# Patient Record
Sex: Male | Born: 1940 | Race: White | Hispanic: No | Smoking: Never smoker
Health system: Southern US, Community
[De-identification: ages and names within clinical notes are randomized; demographics above are authoritative.]

## PROBLEM LIST (undated history)

## (undated) DIAGNOSIS — M109 Gout, unspecified: Secondary | ICD-10-CM

## (undated) DIAGNOSIS — I2510 Atherosclerotic heart disease of native coronary artery without angina pectoris: Secondary | ICD-10-CM

## (undated) DIAGNOSIS — E7800 Pure hypercholesterolemia, unspecified: Secondary | ICD-10-CM

## (undated) DIAGNOSIS — C9110 Chronic lymphocytic leukemia of B-cell type not having achieved remission: Secondary | ICD-10-CM

## (undated) DIAGNOSIS — I10 Essential (primary) hypertension: Secondary | ICD-10-CM

## (undated) DIAGNOSIS — N429 Disorder of prostate, unspecified: Secondary | ICD-10-CM

## (undated) DIAGNOSIS — K5792 Diverticulitis of intestine, part unspecified, without perforation or abscess without bleeding: Secondary | ICD-10-CM

## (undated) HISTORY — PX: APPENDECTOMY: SHX54

## (undated) HISTORY — PX: CARDIAC CATHETERIZATION: SHX172

---

## 2003-04-19 ENCOUNTER — Ambulatory Visit (HOSPITAL_COMMUNITY): Admission: RE | Admit: 2003-04-19 | Discharge: 2003-04-19 | Payer: Self-pay | Admitting: Gastroenterology

## 2003-04-19 ENCOUNTER — Encounter (INDEPENDENT_AMBULATORY_CARE_PROVIDER_SITE_OTHER): Payer: Self-pay | Admitting: *Deleted

## 2005-01-05 ENCOUNTER — Inpatient Hospital Stay (HOSPITAL_COMMUNITY): Admission: EM | Admit: 2005-01-05 | Discharge: 2005-01-08 | Payer: Self-pay | Admitting: Internal Medicine

## 2006-12-16 ENCOUNTER — Encounter: Admission: RE | Admit: 2006-12-16 | Discharge: 2006-12-16 | Payer: Self-pay

## 2008-05-28 ENCOUNTER — Encounter: Admission: RE | Admit: 2008-05-28 | Discharge: 2008-05-28 | Payer: Self-pay | Admitting: Gastroenterology

## 2009-02-26 ENCOUNTER — Emergency Department (HOSPITAL_BASED_OUTPATIENT_CLINIC_OR_DEPARTMENT_OTHER): Admission: EM | Admit: 2009-02-26 | Discharge: 2009-02-26 | Payer: Self-pay | Admitting: Emergency Medicine

## 2011-02-09 NOTE — H&P (Signed)
NAMEEDUAR, KUMPF               ACCOUNT NO.:  [id number]   MEDICAL RECORD NO.:  [id number]          PATIENT TYPE:  INP   LOCATION:  0442                         FACILITY:  [facility]   PHYSICIAN:  Elliot Cousin, M.D.    DATE OF BIRTH:  05/07/1941   DATE OF ADMISSION:  01/05/2005  DATE OF DISCHARGE:                                HISTORY & PHYSICAL   PRIMARY CARE PHYSICIAN:  Dr. Marjory Lies.   CHIEF COMPLAINT:  Lower abdominal pain and cramping, nausea, subjective  fever and chills.   HISTORY OF PRESENT ILLNESS:  Mr. Perrelli is a 70 year old man with a past  medical history significant for diverticulitis in December 2005, colon  polyps, and hypertension, who presented to his physician's office today with  a chief complaint of lower abdominal pain described as crampy. [name]  symptoms started 3 days ago. The crampy pain is worse on the left than the  right; 7-8/10 in intensity.  He has not had diarrhea or constipation  although he states that he has a mucous discharge from his rectum. He has  had an increase in flatulence. His last bowel movement was considered  normal 2 days ago. The patient has had some intermittent nausea but no  vomiting. His appetite has been poor. He has had subjective fever and  chills. He denies melena and bright red blood per rectum. He denies any  painful urination. He has not eaten nuts or seeds as recommended by his  physician in the past. The night before his symptoms started, he did eat out  at a [employer]. The patient states that his symptoms are  reminiscent of diverticulitis diagnosed December 2005. The patient had a  prescription as needed for Cipro and Flagyl which was given to him in  January by Dr. Doristine Counter. The patient did fill the prescription 2 days ago and  has taken Flagyl and Cipro over the past 2 days. He has not obtained relief.  The patient diagnosis NSAID use chronically. He drinks alcohol only  occasionally.   PAST  MEDICAL HISTORY:  1.  Acute diverticulitis, December 2005. CT scan of the abdomen and pelvis      at [facility] confirmed acute diverticulitis of the upper sigmoid      colon.  2.  Colonic polyps and diverticulosis per colonoscopy in July 2004 by Dr.      Loreta Ave. The pathology report revealed adenomatous and hyperplastic polyps.  3.  Hypertension.  4.  Hyperlipidemia.  5.  BPH.  6.  History of elevated LFTs.  7.  Gout.  8.  Seasonal allergies.  9.  Status post appendectomy in the past.   MEDICATIONS:  1.  Cipro 500 mg b.i.d.  2.  Flagyl 500 mg b.i.d.  3.  Flomax 0.4 mg daily.  4.  Zetia 10 mg daily.  5.  Avapro 150 mg daily.  6.  Norvasc 5 mg daily.  7.  Allopurinol 300 mg daily.  8.  Allegra 180 mg daily.   ALLERGIES:  The patient has no known drug allergies although he does have an  intolerance  to CODEINE and ACE INHIBITORS. The ACE inhibitors cause a cough.   SOCIAL HISTORY:  The patient is divorced. [place] He has [place] child. He is employed as a Merchandiser, retail at [employer]. He  denies tobacco and illicit drug use. He does drink alcohol occasionally.   FAMILY HISTORY:  His father died of coronary artery disease at 71 years of  age. The health and status of his mother are unknown.   REVIEW OF SYSTEMS:  [name] review of systems is otherwise negative.   PHYSICAL EXAMINATION:  VITAL SIGNS: Temperature 96.8, pulse is 55,  respiratory rate 20, blood pressure 117/80, oxygen saturation 95% on room  air.  GENERAL:  The patient is a pleasant 70 year old Caucasian man who is  currently lying in bed in only mild abdominal distress.  HEENT:  Head is normocephalic, nontraumatic. Pupils are equally round,  reactive to light. Extraocular movements are intact. Conjunctivae are clear,  sclerae are white. Tympanic membranes are clear bilaterally. Nasal mucosa is  moist, no drainage. No sinus tenderness. Oropharynx reveals good dentition.  No posterior  exudates or erythema. Mucous membranes are moist.  NECK:  Supple. No adenopathy, no thyromegaly, no bruit, no JVD.  LUNGS:  Clear to auscultation bilaterally.  HEART:  S1, S2, no murmurs, rubs or gallops, borderline bradycardia.  ABDOMEN:  Mildly obese, positive bowel sounds although they were hypoactive.  Abdomen is mildly distended. Tender moderately in the left lower quadrant  greater than the right lower quadrant. No rebound, no guarding. No masses  palpated. No appreciable hepatosplenomegaly.  RECTAL:  The patient has excellent rectal tone. His prostate is smooth and  mildly enlarged. There was no stool in the rectal vault and no blood on the  examining glove. No masses palpated.  GENITOURINARY:  Deferred.  EXTREMITIES:  The patient has excellent range of motion of all of his  extremities. Pedal pulses are 2+ bilaterally. No pretibial edema, no pedal  edema. No acute joint abnormalities.  NEUROLOGIC:  The patient is alert and oriented x3. Cranial nerves II-XII are  intact. Strength is 5/5 throughout. Sensation is intact. Gait is within  normal limits.   ADMISSION LABORATORY:  Pending.   ASSESSMENT:  1.  Crampy lower abdominal pain, nausea, subjective fever and chills. 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 He is currently afebrile      and does not appear toxic. Further evaluation is needed.  2.  Hypertension. The patient states that his blood pressures have been well      controlled on Avapro and Norvasc.   PLAN:  1.  The patient was directly admitted from Dr. Duanne Guess.  2.  The patient will be further evaluated with a CT scan of the abdomen and      pelvis, CBC, CMET, amylase, and lipase.  3.  Gentle IV fluids of D5 normal saline with potassium chloride added.  4.  Will try sips and chips and then progress to clear liquid diet if      tolerated.  5.  Cipro and Flagyl IV. 6.  Will minimize p.o. medications for now but will start Flomax and Avapro      only for  right now. Will add p.o. medications as the patient can      tolerate it.  7.  Morphine as needed for pain.      DF/MEDQ  D:  01/05/2005  T:  01/05/2005  Job:  [id number]   cc:  Marjory Lies, M.D.  [address]  [place]  [place] [address]  Fax: [phone]   Maryelizabeth Rowan, M.D.  [facility]  [place], [place] [address]  Fax: [phone]

## 2011-02-09 NOTE — Discharge Summary (Signed)
Corey [name], Corey [name]               ACCOUNT NO.:  [id number]   MEDICAL RECORD NO.:  [id number]          PATIENT TYPE:  INP   LOCATION:  0442                         [facility]ACILITY:  [facility]   PHYSICIAN:  Isidor Holts, M.D.  DATE OF BIRTH:  1941-04-10   DATE OF ADMISSION:  01/05/2005  DATE OF DISCHARGE:  01/08/2005                                 DISCHARGE SUMMARY   PRIMARY CARE PHYSICIAN:  Marjory Lies, M.D.   DISCHARGE DIAGNOSES:  1.  Acute sigmoid diverticulitis.  2.  History of hypertension.  3.  History of gout and dyslipidemia.  4.  Benign prostatic hyperplasia.  5.  History of seasonal allergies.   DISCHARGE MEDICATIONS:  1.  Ciprofloxacin 500 mg p.o. b.i.d. to be completed on January 15, 2005.  2.  Flagyl 500 mg p.o. t.i.d. to be completed on January 15, 2005.  3.  Continue all other preadmission medications.   PROCEDURES:  1.  Abdominal/pelvic CT scan dated January 05, 2005. This showed sigmoid      diverticulitis without evidence of pelvic abscess or perforation.  There      were no acute abdominal findings of hepatic or renal cyst.  2.  Abdominal x-ray dated January 07, 2005.  This showed no bowel obstruction      or dilatation.  No acute findings.   CONSULTATIONS:  None.   ADMISSION HISTORY:  As in H&P note of January 05, 2005.  However in brief,  this is a 70 year old male with known history of diverticulosis, status post  acute diverticulitis December 2005, colon polyps, hypertension,  dyslipidemia, and BPH, as well as gout and seasonal allergic rhinitis, who  presented to his primary care physician with a three-day history of crampy  lower abdominal pain.  During his previous acute diverticulitis, he had been  given a prescription for ciprofloxacin and Flagyl by his primary care  physician, Dr. Marjory Lies.  The patient filled this two days prior to  presentation and took the tablets accordingly, without any appreciable  relief.  He was admitted for further evaluation,  investigation, and  management.   CLINICAL COURSE:  #1.  ACUTE SIGMOID DIVERTICULITIS:  The patient presented with three-day  history of crampy lower abdominal pain.  He gives background history of  documented diverticulosis, status post acute diverticulitis in December  2005.  Pelvic/abdominal CT scan was done which confirmed acute sigmoid  diverticulitis.  The patient was managed with bowel rest, intravenous fluid  hydration, intravenous Flagyl and ciprofloxacin as well as analgesic.  Clinical response was steady with gradual amelioration of symptomatology.  The patient was subsequently placed on both mechanical and regular diet, all  of which patient tolerated in sequence without any exacerbation of symptoms.   #2.  BACKGROUND CLINICAL PROBLEMS INCLUDING HYPERTENSION, PROSTATISM,  DYSLIPIDEMIA, GOUT:  All of which remained stable throughout the course of  patient's hospital stay.   DISPOSITION:  The patient was discharged in satisfactory condition on January 08, 2005.  It is noted that he has had a background history of abnormal LFTs  secondary to statin use.  We note, that LFTs  done during his current  admission were all within normal limits.  The patient was reassured  accordingly.   PAIN MANAGEMENT:  Not applicable.   ACTIVITY:  No restrictions.   DIET:  High-fiber diet with four to five 8-ounce glasses of fluid per 24  hours.   WOUND CARE:  Not applicable.   FOLLOW UP:  The patient is instructed to follow up with his primary care  physician within 7 to 10 days.  He is to call for an appointment, and he has  verbalized understanding.      CO/MEDQ  D:  01/09/2005  T:  01/09/2005  Job:  [id number]   cc:   Marjory Lies, M.D.  [address]  [place]  [place] [address]  Fax: [phone]

## 2011-02-09 NOTE — Op Note (Signed)
NAME:  Corey [name], Corey [name]                         ACCOUNT NO.:  [id number]   MEDICAL RECORD NO.:  [id number]                   PATIENT TYPE:  AMB   LOCATION:  ENDO                                 FACILITY:  [facility]   PHYSICIAN:  Anselmo Rod, M.D.               DATE OF BIRTH:  01/02/1941   DATE OF PROCEDURE:  04/19/2003  DATE OF DISCHARGE:                                 OPERATIVE REPORT   PROCEDURE PERFORMED:  Colonoscopy with snare polypectomy times two and cold  biopsies times eight.   ENDOSCOPIST:  Charna Elizabeth, M.D.   INSTRUMENT USED:  Olympus video colonoscope.   INDICATIONS FOR PROCEDURE:  70 year old white male with a history of change  in bowel habits and a personal history of polyps removed several years ago.  Rule out colonic polyps, etc.   PREPROCEDURE PREPARATION:  Informed consent was procured from the patient.  The patient was fasted for eight hours prior to the procedure and prepped  with a bottle of magnesium citrate and a gallon of GoLYTELY the night prior  to the procedure.   PREPROCEDURE PHYSICAL:  The patient had stable vital signs.  Neck supple.  Chest clear to auscultation.  S1 and S2 regular.  Abdomen soft with normal  bowel sounds.   DESCRIPTION OF PROCEDURE:  The patient was placed in left lateral decubitus  position and sedated with 80 mg of Demerol and 8 mg of Versed intravenously.  Once the patient was adequately sedated and maintained on low flow oxygen  and continuous cardiac monitoring, the Olympus video colonoscope was  advanced from the rectum to the cecum without difficulty.  The patient had a  fairly good prep.  The appendicular orifice and ileocecal valve were clearly  visualized and photographed.  A small sessile polyp was snared from 60 cm  and another polyp was snared from 110 cm.  Multiple small sessile polyps  were biopsied with cold biopsy forceps from the rectosigmoid area.  Few  scattered diverticula were seen throughout the colon  __________ retroflexion  in the rectum revealed no abnormalities.  The patient tolerated the  procedure well without complications.   IMPRESSION:  1. Multiple colonic polyps removed (see description above).  2. Few scattered early diverticula throughout the colon.   RECOMMENDATIONS:  1. Await pathology results.  2. Avoid all nonsteroidals including aspirin for the next two weeks.  3. Outpatient follow-up in the next two weeks for further recommendation.                                                   Anselmo Rod, M.D.    JNM/MEDQ  D:  04/19/2003  T:  04/19/2003  Job:  [id number]   cc:   Marjory Lies, M.D.  [address]  [place]  [place] [address]  Fax: [phone]

## 2012-01-16 ENCOUNTER — Ambulatory Visit
Admission: RE | Admit: 2012-01-16 | Discharge: 2012-01-16 | Disposition: A | Discharge status: Home / Self Care | Payer: Medicare Other | Source: Ambulatory Visit | Attending: Gastroenterology | Admitting: Gastroenterology

## 2012-01-16 ENCOUNTER — Other Ambulatory Visit: Payer: Self-pay | Admitting: Gastroenterology

## 2012-01-16 DIAGNOSIS — R109 Unspecified abdominal pain: Secondary | ICD-10-CM

## 2012-01-16 MED ORDER — IOHEXOL 300 MG/ML  SOLN
100.0000 mL | Freq: Once | INTRAMUSCULAR | Status: AC | PRN
  Administered 2012-01-16: 100 mL via INTRAVENOUS

## 2012-06-04 ENCOUNTER — Emergency Department (HOSPITAL_BASED_OUTPATIENT_CLINIC_OR_DEPARTMENT_OTHER): Payer: Medicare Other

## 2012-06-04 ENCOUNTER — Emergency Department (HOSPITAL_BASED_OUTPATIENT_CLINIC_OR_DEPARTMENT_OTHER)
Admission: EM | Admit: 2012-06-04 | Discharge: 2012-06-04 | Disposition: A | Discharge status: Home / Self Care | Payer: Medicare Other | Attending: Emergency Medicine | Admitting: Emergency Medicine

## 2012-06-04 ENCOUNTER — Encounter (HOSPITAL_BASED_OUTPATIENT_CLINIC_OR_DEPARTMENT_OTHER): Payer: Self-pay | Admitting: *Deleted

## 2012-06-04 DIAGNOSIS — Z79899 Other long term (current) drug therapy: Secondary | ICD-10-CM | POA: Insufficient documentation

## 2012-06-04 DIAGNOSIS — Q619 Cystic kidney disease, unspecified: Secondary | ICD-10-CM | POA: Insufficient documentation

## 2012-06-04 DIAGNOSIS — I10 Essential (primary) hypertension: Secondary | ICD-10-CM | POA: Insufficient documentation

## 2012-06-04 DIAGNOSIS — N281 Cyst of kidney, acquired: Secondary | ICD-10-CM

## 2012-06-04 DIAGNOSIS — E7800 Pure hypercholesterolemia, unspecified: Secondary | ICD-10-CM | POA: Insufficient documentation

## 2012-06-04 DIAGNOSIS — M109 Gout, unspecified: Secondary | ICD-10-CM | POA: Insufficient documentation

## 2012-06-04 DIAGNOSIS — K5792 Diverticulitis of intestine, part unspecified, without perforation or abscess without bleeding: Secondary | ICD-10-CM

## 2012-06-04 DIAGNOSIS — K5732 Diverticulitis of large intestine without perforation or abscess without bleeding: Secondary | ICD-10-CM | POA: Insufficient documentation

## 2012-06-04 DIAGNOSIS — Z9089 Acquired absence of other organs: Secondary | ICD-10-CM | POA: Insufficient documentation

## 2012-06-04 HISTORY — DX: Pure hypercholesterolemia, unspecified: E78.00

## 2012-06-04 HISTORY — DX: Essential (primary) hypertension: I10

## 2012-06-04 HISTORY — DX: Diverticulitis of intestine, part unspecified, without perforation or abscess without bleeding: K57.92

## 2012-06-04 HISTORY — DX: Gout, unspecified: M10.9

## 2012-06-04 LAB — COMPREHENSIVE METABOLIC PANEL
ALT: 45 U/L (ref 0–53)
AST: 48 U/L — ABNORMAL HIGH (ref 0–37)
Albumin: 3.9 g/dL (ref 3.5–5.2)
Alkaline Phosphatase: 60 U/L (ref 39–117)
BUN: 7 mg/dL (ref 6–23)
Chloride: 102 mEq/L (ref 96–112)
GFR calc Af Amer: 85 mL/min — ABNORMAL LOW (ref >90)
Potassium: 3.4 mEq/L — ABNORMAL LOW (ref 3.5–5.1)
Sodium: 141 mEq/L (ref 135–145)
Total Bilirubin: 1.4 mg/dL — ABNORMAL HIGH (ref 0.3–1.2)

## 2012-06-04 LAB — CBC WITH DIFFERENTIAL/PLATELET
Basophils Absolute: 0 10*3/uL (ref 0.0–0.1)
Basophils Relative: 0 % (ref 0–1)
Eosinophils Relative: 1 % (ref 0–5)
HCT: 50.8 % (ref 39.0–52.0)
Lymphocytes Relative: 44 % (ref 12–46)
MCHC: 35.4 g/dL (ref 30.0–36.0)
Platelets: 139 10*3/uL — ABNORMAL LOW (ref 150–400)
RDW: 12.5 % (ref 11.5–15.5)

## 2012-06-04 MED ORDER — SODIUM CHLORIDE 0.9 % IV SOLN
1000.0000 mL | Freq: Once | INTRAVENOUS | Status: AC
  Administered 2012-06-04: 1000 mL via INTRAVENOUS

## 2012-06-04 MED ORDER — IOHEXOL 300 MG/ML  SOLN
36.0000 mL | Freq: Once | INTRAMUSCULAR | Status: AC | PRN
  Administered 2012-06-04: 36 mL via ORAL

## 2012-06-04 MED ORDER — HYDROCODONE-ACETAMINOPHEN 5-325 MG PO TABS: 1.0000 | ORAL_TABLET | ORAL | Status: AC | PRN

## 2012-06-04 MED ORDER — METRONIDAZOLE 500 MG PO TABS
500.0000 mg | ORAL_TABLET | Freq: Once | ORAL | Status: AC
  Administered 2012-06-04: 500 mg via ORAL
  Filled 2012-06-04: qty 1

## 2012-06-04 MED ORDER — CIPROFLOXACIN HCL 500 MG PO TABS: 500.0000 mg | ORAL_TABLET | Freq: Two times a day (BID) | ORAL | Status: AC

## 2012-06-04 MED ORDER — IOHEXOL 300 MG/ML  SOLN
100.0000 mL | Freq: Once | INTRAMUSCULAR | Status: AC | PRN
  Administered 2012-06-04: 100 mL via INTRAVENOUS

## 2012-06-04 MED ORDER — CIPROFLOXACIN HCL 500 MG PO TABS
500.0000 mg | ORAL_TABLET | Freq: Once | ORAL | Status: AC
  Administered 2012-06-04: 500 mg via ORAL
  Filled 2012-06-04: qty 1

## 2012-06-04 MED ORDER — SODIUM CHLORIDE 0.9 % IV SOLN: 1000.0000 mL | INTRAVENOUS | Status: DC

## 2012-06-04 MED ORDER — METRONIDAZOLE 500 MG PO TABS: 500.0000 mg | ORAL_TABLET | Freq: Two times a day (BID) | ORAL | Status: AC

## 2012-06-04 MED ORDER — ONDANSETRON 8 MG PO TBDP: 8.0000 mg | ORAL_TABLET | Freq: Three times a day (TID) | ORAL | Status: DC | PRN

## 2012-06-04 NOTE — ED Provider Notes (Signed)
History     CSN: [id number]  Arrival date & time 06/04/12  1117   First MD Initiated Contact with Patient 06/04/12 1144      Chief Complaint  Patient presents with  . Abdominal Pain     The history is provided by the patient.   the patient reports worsening abdominal pain over the past 2 days.  He had nausea earlier today but no longer has nausea.  He does report a history of diverticulitis before in the past reports this feels similar to that.  He denies vomiting or diarrhea.  His had no fevers or chills.  He reports he has both right-sided and left-sided abdominal pain.  No discomfort with urination.  No urinary frequency.  No fevers or chills.  He has had mild decreased oral intake.  Past Medical History  Diagnosis Date  . Diverticulitis   . Gout   . Hypercholesteremia   . Hypertension     Past Surgical History  Procedure Date  . Appendectomy     History reviewed. No pertinent family history.  History  Substance Use Topics  . Smoking status: Never Smoker   . Smokeless tobacco: Not on file  . Alcohol Use:       Review of Systems  All other systems reviewed and are negative.    Allergies  Review of patient's allergies indicates no known allergies.  Home Medications   Current Outpatient Rx  Name Route Sig Dispense Refill  . ALLOPURINOL 300 MG PO TABS Oral Take 300 mg by mouth daily.    Marland Kitchen AMLODIPINE BESYLATE 5 MG PO TABS Oral Take 5 mg by mouth daily.    Marland Kitchen LORATADINE 10 MG PO TABS Oral Take 10 mg by mouth daily.    Marland Kitchen OMEPRAZOLE 20 MG PO CPDR Oral Take 20 mg by mouth daily.    Marland Kitchen PRAVASTATIN SODIUM 40 MG PO TABS Oral Take 40 mg by mouth daily.    Marland Kitchen TAMSULOSIN HCL 0.4 MG PO CAPS Oral Take by mouth.      BP 163/83  Pulse 83  Temp 98.2 F (36.8 C) (Oral)  Resp 18  Ht 5\' 9"  (1.753 m)  Wt 175 lb (79.379 kg)  BMI 25.84 kg/m2  SpO2 96%  Physical Exam  Nursing note and vitals reviewed. Constitutional: He is oriented to person, place, and time. He appears  well-developed and well-nourished.  HENT:  Head: Normocephalic and atraumatic.  Eyes: EOM are normal.  Neck: Normal range of motion.  Cardiovascular: Normal rate, regular rhythm, normal heart sounds and intact distal pulses.   Pulmonary/Chest: Effort normal and breath sounds normal. No respiratory distress.  Abdominal: Soft.       Mild generalized abdominal tenderness left greater than right.  No peritoneal signs.  Mild abdominal distention.  Musculoskeletal: Normal range of motion.  Neurological: He is alert and oriented to person, place, and time.  Skin: Skin is warm and dry.  Psychiatric: He has a normal mood and affect. Judgment normal.    ED Course  Procedures (including critical care time)  Labs Reviewed  CBC WITH DIFFERENTIAL - Abnormal; Notable for the following:    Hemoglobin 18.0 (*)     Platelets 139 (*)     All other components within normal limits  COMPREHENSIVE METABOLIC PANEL - Abnormal; Notable for the following:    Potassium 3.4 (*)     Glucose, Bld 140 (*)     AST 48 (*)     Total Bilirubin 1.4 (*)  GFR calc non Af Amer 74 (*)     GFR calc Af Amer 85 (*)     All other components within normal limits   Ct Abdomen Pelvis W Contrast  06/04/2012  *RADIOLOGY REPORT*  Clinical Data: Abdominal pain.  CT ABDOMEN AND PELVIS WITH CONTRAST  Technique:  Multidetector CT imaging of the abdomen and pelvis was performed following the standard protocol during bolus administration of intravenous contrast.  Contrast: OMNIPAQUE IOHEXOL 300 MG/ML  SOLN  Comparison: 01/16/2012  Findings: Coronary artery calcifications are present.  Heart is normal size.  Lung bases are clear.  No effusions.  Tiny low density lesion in the dome of the liver, likely small cyst, stable.  No new liver lesions.  Gallbladder, spleen, pancreas, adrenals grossly unremarkable.  Small low-density areas in the kidneys bilaterally.  One area appears to have enlarged slightly in the posterior mid pole of  the right kidney.  This currently measures 1.8 cm compared to 1.4 cm previously. This area measured 1.2 cm in 2009. This may reflect a minimally complex cyst, but follow-up is recommended.  The other low density areas in the kidneys appear represent simple/benign cysts.  There is extensive sigmoid diverticulosis.  Surrounding inflammatory change compatible with active diverticulitis.  Small bowel is decompressed.  The wall the distal stomach/pyloric region appears thickened. Recommend clinical correlation for symptoms of peptic ulcer disease or gastritis, but this is felt to most likely be related to contraction or nondistension.  Mild enlargement of the prostate gland.  Urinary bladder is unremarkable.  No acute bony abnormality.  IMPRESSION: Diffuse sigmoid diverticulosis.  Changes of active diverticulitis.  Small bilateral renal cysts.  There is an indeterminate lesion measuring 18 mm in the posterior mid pole of the right kidney. This has slowly enlarged since 2009 when this measured 12 mm. There is suggestion of internal septation or possible mild posterior wall thickening.  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  Consider follow-up and further characterization with MRI without and with contrast in 6 months.  Coronary artery disease.   Original Report Authenticated By: Cyndie Chime, M.D.     I personally reviewed the imaging tests through PACS system I reviewed available ER/hospitalization records thought the EMR    1.  Diverticulitis 2 renal cysts   MDM  The patient feels much better at this time.  We will treat this as early diverticulitis based on CT findings.  Cipro and Flagyl now.  The patient does appear to have indeterminate increasing in size lesions of his bilateral kidneys.  Suspected these are cysts but he will need followup and further characterization of these by MRI.  Ive given the patient a  number for urology for close followup.  He understands importance of following this finding at.  He will return the emergency department for new or worsening symptoms.  Home with symptomatic care.        Lyanne Co, MD 06/04/12 [phone]

## 2012-06-04 NOTE — ED Notes (Signed)
Pt reports abd pain x 2 days, states feels similar to his diverticultis in the past. Pt states he took a dulcolax on Monday due to being constipated for several days, and had good results. Yesterday had onset of generalized abd pain, "all over", denies any n/v/d or other c/o.

## 2012-11-13 ENCOUNTER — Encounter (HOSPITAL_BASED_OUTPATIENT_CLINIC_OR_DEPARTMENT_OTHER): Payer: Self-pay

## 2012-11-13 ENCOUNTER — Emergency Department (HOSPITAL_BASED_OUTPATIENT_CLINIC_OR_DEPARTMENT_OTHER): Payer: Medicare Other

## 2012-11-13 ENCOUNTER — Emergency Department (HOSPITAL_BASED_OUTPATIENT_CLINIC_OR_DEPARTMENT_OTHER)
Admission: EM | Admit: 2012-11-13 | Discharge: 2012-11-13 | Disposition: A | Discharge status: Home / Self Care | Payer: Medicare Other | Attending: Emergency Medicine | Admitting: Emergency Medicine

## 2012-11-13 DIAGNOSIS — M109 Gout, unspecified: Secondary | ICD-10-CM | POA: Insufficient documentation

## 2012-11-13 DIAGNOSIS — Z79899 Other long term (current) drug therapy: Secondary | ICD-10-CM | POA: Insufficient documentation

## 2012-11-13 DIAGNOSIS — E7800 Pure hypercholesterolemia, unspecified: Secondary | ICD-10-CM | POA: Insufficient documentation

## 2012-11-13 DIAGNOSIS — K5732 Diverticulitis of large intestine without perforation or abscess without bleeding: Secondary | ICD-10-CM | POA: Insufficient documentation

## 2012-11-13 DIAGNOSIS — I10 Essential (primary) hypertension: Secondary | ICD-10-CM | POA: Insufficient documentation

## 2012-11-13 DIAGNOSIS — K5792 Diverticulitis of intestine, part unspecified, without perforation or abscess without bleeding: Secondary | ICD-10-CM

## 2012-11-13 DIAGNOSIS — R110 Nausea: Secondary | ICD-10-CM | POA: Insufficient documentation

## 2012-11-13 LAB — CBC WITH DIFFERENTIAL/PLATELET
Basophils Absolute: 0 10*3/uL (ref 0.0–0.1)
Basophils Relative: 0 % (ref 0–1)
Eosinophils Relative: 1 % (ref 0–5)
HCT: 52.6 % — ABNORMAL HIGH (ref 39.0–52.0)
Hemoglobin: 18.3 g/dL — ABNORMAL HIGH (ref 13.0–17.0)
Lymphs Abs: 5 10*3/uL — ABNORMAL HIGH (ref 0.7–4.0)
Monocytes Absolute: 0.9 10*3/uL (ref 0.1–1.0)
RBC: 6.01 MIL/uL — ABNORMAL HIGH (ref 4.22–5.81)
RDW: 13.4 % (ref 11.5–15.5)
WBC: 12.2 10*3/uL — ABNORMAL HIGH (ref 4.0–10.5)

## 2012-11-13 LAB — URINALYSIS, ROUTINE W REFLEX MICROSCOPIC
Bilirubin Urine: NEGATIVE
Hgb urine dipstick: NEGATIVE
Ketones, ur: NEGATIVE mg/dL
Leukocytes, UA: NEGATIVE
Nitrite: NEGATIVE
Protein, ur: NEGATIVE mg/dL
Urobilinogen, UA: 1 mg/dL (ref 0.0–1.0)

## 2012-11-13 LAB — BASIC METABOLIC PANEL
BUN: 8 mg/dL (ref 6–23)
Calcium: 9.2 mg/dL (ref 8.4–10.5)
Chloride: 101 mEq/L (ref 96–112)
Creatinine, Ser: 1 mg/dL (ref 0.50–1.35)

## 2012-11-13 MED ORDER — IOHEXOL 300 MG/ML  SOLN
100.0000 mL | Freq: Once | INTRAMUSCULAR | Status: AC | PRN
  Administered 2012-11-13: 100 mL via INTRAVENOUS

## 2012-11-13 MED ORDER — METRONIDAZOLE 500 MG PO TABS: 500.0000 mg | ORAL_TABLET | Freq: Three times a day (TID) | ORAL | Status: DC

## 2012-11-13 MED ORDER — SODIUM CHLORIDE 0.9 % IV BOLUS (SEPSIS)
1000.0000 mL | Freq: Once | INTRAVENOUS | Status: AC
  Administered 2012-11-13: 1000 mL via INTRAVENOUS

## 2012-11-13 MED ORDER — ONDANSETRON HCL 4 MG PO TABS: 4.0000 mg | ORAL_TABLET | Freq: Four times a day (QID) | ORAL | Status: DC

## 2012-11-13 MED ORDER — CIPROFLOXACIN IN D5W 400 MG/200ML IV SOLN
400.0000 mg | Freq: Two times a day (BID) | INTRAVENOUS | Status: DC
  Administered 2012-11-13: 400 mg via INTRAVENOUS
  Filled 2012-11-13: qty 200

## 2012-11-13 MED ORDER — HYDROCODONE-ACETAMINOPHEN 5-325 MG PO TABS: 1.0000 | ORAL_TABLET | ORAL | Status: DC | PRN

## 2012-11-13 MED ORDER — HYDROMORPHONE HCL PF 1 MG/ML IJ SOLN
1.0000 mg | Freq: Once | INTRAMUSCULAR | Status: AC
  Administered 2012-11-13: 1 mg via INTRAVENOUS
  Filled 2012-11-13: qty 1

## 2012-11-13 MED ORDER — METRONIDAZOLE IN NACL 5-0.79 MG/ML-% IV SOLN
500.0000 mg | Freq: Once | INTRAVENOUS | Status: AC
  Administered 2012-11-13: 500 mg via INTRAVENOUS
  Filled 2012-11-13: qty 100

## 2012-11-13 MED ORDER — CIPROFLOXACIN HCL 500 MG PO TABS: 500.0000 mg | ORAL_TABLET | Freq: Two times a day (BID) | ORAL | Status: DC

## 2012-11-13 MED ORDER — IOHEXOL 300 MG/ML  SOLN
50.0000 mL | Freq: Once | INTRAMUSCULAR | Status: AC | PRN
  Administered 2012-11-13: 50 mL via ORAL

## 2012-11-13 NOTE — ED Provider Notes (Signed)
72 year old male with a history of diverticulitis treated in May of this last year. Who presents with approximately 5 days of gradually worsening left lower quadrant and suprapubic abdominal pain. This had initially gotten worse, over the last 24 hours has seemed to improve but he wanted it checked this morning. He has been able to eat and drink without vomiting, denies diarrhea or rectal bleeding, has no fevers coughing or shortness of breath.  On exam the patient has a soft abdomen, no tenderness in the upper abdomen, significant tenderness to the left lower quadrant and suprapubic and right lower quadrant areas. There does not seem to be a focal tender spot that is more tender than other places, he does not have a surgical abdomen, his upper abdomen including right upper quadrant and epigastrium is nontender. He does not have a hernia on my exam, his scrotum and penile exam are normal. His testicles are descended bilaterally, there is no scrotal or inguinal hernias palpated.  I am concerned that the patient may have a complication of recurrent diverticulitis given the amount of pain that he is having. We'll obtain lab work and a urinalysis and fall this with a CT scan of the abdomen and pelvis. Pain medication ordered, n.p.o. status encouraged.  At change of shift, CT scan still pending, Dr. Ranae Palms to followup results  Medical screening examination/treatment/procedure(s) were conducted as a shared visit with non-physician practitioner(s) and myself.  I personally evaluated the patient during the encounter    [name], MD 11/14/12 [phone]

## 2012-11-13 NOTE — ED Notes (Signed)
Pt reports abdominal pain described as cramping x 1 1/2 weeks associated with nausea.

## 2012-11-13 NOTE — ED Provider Notes (Signed)
History     CSN: [id number]  Arrival date & time 11/13/12  1227   First MD Initiated Contact with Patient 11/13/12 1234      Chief Complaint  Patient presents with  . Abdominal Pain  . Abdominal Cramping  . Nausea    (Consider location/radiation/quality/duration/timing/severity/associated sxs/prior treatment) HPI Comments: Patient with hx of HTN and diverticulitis presents for LLQ pain x 1.5 weeks that has been constant and radiating around to the subrapubic region. Patient states the pain was worsening and then improved for the last two days and got worse again today which brought him to the ED. The pain is an aching sensation with intermittent shooting-type pain. States pain is made worse with movement/bending and denies alleviating factors. Patient admits to associated nausea. Denies fever, vomiting, diarrhea, urinary symptoms, CP, and SOB. States last BM was 2-3 days ago. Patient is followed by Dr. Loreta Ave, a gastroenterologist and states his last colonoscopy was 2 yrs ago.  Patient is a 72 y.o. male presenting with abdominal pain and cramps. The history is provided by the patient. No language interpreter was used.  Abdominal Pain Associated symptoms: nausea   Associated symptoms: no chest pain, no chills, no diarrhea, no dysuria, no fever, no hematuria, no shortness of breath and no vomiting   Abdominal Cramping Associated symptoms include abdominal pain and nausea. Pertinent negatives include no chest pain, chills, fever, headaches or vomiting.    Past Medical History  Diagnosis Date  . Diverticulitis   . Gout   . Hypercholesteremia   . Hypertension     Past Surgical History  Procedure Laterality Date  . Appendectomy      No family history on file.  History  Substance Use Topics  . Smoking status: Never Smoker   . Smokeless tobacco: Not on file  . Alcohol Use: Yes     Comment: occasional     Review of Systems  Constitutional: Negative for fever and chills.   HENT: Negative.   Eyes: Negative for visual disturbance.  Respiratory: Negative for shortness of breath.   Cardiovascular: Negative for chest pain.  Gastrointestinal: Positive for nausea and abdominal pain. Negative for vomiting, diarrhea and blood in stool.  Genitourinary: Negative for dysuria and hematuria.  Neurological: Negative for light-headedness and headaches.  All other systems reviewed and are negative.    Allergies  Review of patient's allergies indicates no known allergies.  Home Medications   Current Outpatient Rx  Name  Route  Sig  Dispense  Refill  . allopurinol (ZYLOPRIM) 300 MG tablet   Oral   Take 300 mg by mouth daily.         Marland Kitchen amLODipine (NORVASC) 5 MG tablet   Oral   Take 5 mg by mouth daily.         Marland Kitchen loratadine (CLARITIN) 10 MG tablet   Oral   Take 10 mg by mouth daily.         Marland Kitchen omeprazole (PRILOSEC) 20 MG capsule   Oral   Take 20 mg by mouth daily.         . ondansetron (ZOFRAN ODT) 8 MG disintegrating tablet   Oral   Take 1 tablet (8 mg total) by mouth every 8 (eight) hours as needed for nausea.   10 tablet   0   . pravastatin (PRAVACHOL) 40 MG tablet   Oral   Take 40 mg by mouth daily.         . Tamsulosin HCl (FLOMAX) 0.4 MG CAPS  Oral   Take by mouth.           BP 136/72  Pulse 94  Temp(Src) 98.1 F (36.7 C) (Oral)  Resp 18  Ht 5\' 9"  (1.753 m)  Wt 175 lb (79.379 kg)  BMI 25.83 kg/m2  SpO2 100%  Physical Exam  Nursing note and vitals reviewed. Constitutional: He is oriented to person, place, and time. He appears well-developed and well-nourished. No distress.  HENT:  Head: Normocephalic and atraumatic.  Eyes: Conjunctivae are normal. Pupils are equal, round, and reactive to light. No scleral icterus.  Neck: Normal range of motion.  Cardiovascular: Normal rate, regular rhythm and normal heart sounds.   Pulmonary/Chest: Effort normal and breath sounds normal. No respiratory distress. He has no wheezes.   Abdominal: Normal appearance. He exhibits distension. He exhibits no ascites and no mass. Bowel sounds are decreased. There is tenderness in the left lower quadrant. There is guarding. There is no rebound, no tenderness at McBurney's point and negative Murphy's sign.  Focal tenderness in LLQ; not tympanic on percussion; no peritoneal signs  Musculoskeletal: Normal range of motion. He exhibits no edema.  Lymphadenopathy:    He has no cervical adenopathy.  Neurological: He is alert and oriented to person, place, and time.  Skin: Skin is warm and dry. No rash noted. He is not diaphoretic. No erythema.  Psychiatric: He has a normal mood and affect. His behavior is normal.    ED Course  Procedures (including critical care time)  Labs Reviewed  CBC WITH DIFFERENTIAL - Abnormal; Notable for the following:    WBC 12.2 (*)    RBC 6.01 (*)    Hemoglobin 18.3 (*)    HCT 52.6 (*)    Lymphs Abs 5.0 (*)    All other components within normal limits  BASIC METABOLIC PANEL - Abnormal; Notable for the following:    Potassium 3.4 (*)    Glucose, Bld 101 (*)    GFR calc non Af Amer 73 (*)    GFR calc Af Amer 85 (*)    All other components within normal limits  URINALYSIS, ROUTINE W REFLEX MICROSCOPIC   No results found.   No diagnosis found.   MDM  Patient with hx of HTN and diverticulitis presents for LLQ pain radiating to his suprapubic region that he states feels like his past episodes of diverticulitis. Pain was worsening and then felt relieved 48-72 hours ago and got gradually worse again within the last day. Patient concerning for possible diverticulitis with abscess given disease progression; will obtain CT to r/o complicated diverticulitis. CBC positive for mild leukocytosis as well as signs of dehydration; will rehydrate with fluid bolus. BMP c/w prior work ups. Pain controlled with IV dilaudid. Patient exam and work up discussed with Dr. Hyacinth Meeker who is in agreemet.  Patient signed out to  Dr. Ranae Palms at shift change who will continue the management of this patient and decide if/when discharge is appropriate.  Filed Vitals:   11/13/12 1239 11/13/12 1246  BP:  136/72  Pulse:  94  Temp: 98.1 F (36.7 C)   TempSrc: Oral   Resp: 18   Height: 5\' 9"  (1.753 m)   Weight: 175 lb (79.379 kg)   SpO2:  100%           Antony Madura, PA-C 11/13/12 1511

## 2012-11-13 NOTE — ED Notes (Signed)
MD at bedside. 

## 2012-11-13 NOTE — ED Notes (Signed)
Urinal provided.

## 2012-11-14 NOTE — ED Provider Notes (Signed)
Medical screening examination/treatment/procedure(s) were conducted as a shared visit with non-physician practitioner(s) and myself.  I personally evaluated the patient during the encounter  Please see my separate respective documentation pertaining to this patient encounter   [name], MD 11/14/12 [phone]

## 2012-12-22 ENCOUNTER — Other Ambulatory Visit (HOSPITAL_COMMUNITY): Payer: Self-pay | Admitting: Urology

## 2012-12-22 DIAGNOSIS — D49519 Neoplasm of unspecified behavior of unspecified kidney: Secondary | ICD-10-CM

## 2013-01-12 ENCOUNTER — Ambulatory Visit (HOSPITAL_COMMUNITY)
Admission: RE | Admit: 2013-01-12 | Discharge: 2013-01-12 | Disposition: A | Discharge status: Home / Self Care | Payer: Medicare Other | Source: Ambulatory Visit | Attending: Urology | Admitting: Urology

## 2013-01-12 ENCOUNTER — Other Ambulatory Visit (HOSPITAL_COMMUNITY): Payer: Self-pay | Admitting: Urology

## 2013-01-12 DIAGNOSIS — IMO0002 Reserved for concepts with insufficient information to code with codable children: Secondary | ICD-10-CM

## 2013-01-12 DIAGNOSIS — R9389 Abnormal findings on diagnostic imaging of other specified body structures: Secondary | ICD-10-CM | POA: Insufficient documentation

## 2013-01-12 DIAGNOSIS — K7689 Other specified diseases of liver: Secondary | ICD-10-CM | POA: Insufficient documentation

## 2013-01-12 DIAGNOSIS — Q619 Cystic kidney disease, unspecified: Secondary | ICD-10-CM | POA: Insufficient documentation

## 2013-01-12 DIAGNOSIS — Z1389 Encounter for screening for other disorder: Secondary | ICD-10-CM | POA: Insufficient documentation

## 2013-01-12 DIAGNOSIS — K862 Cyst of pancreas: Secondary | ICD-10-CM | POA: Insufficient documentation

## 2013-01-12 DIAGNOSIS — K863 Pseudocyst of pancreas: Secondary | ICD-10-CM | POA: Insufficient documentation

## 2013-01-12 DIAGNOSIS — D49519 Neoplasm of unspecified behavior of unspecified kidney: Secondary | ICD-10-CM

## 2013-01-12 LAB — CREATININE, SERUM: GFR calc Af Amer: >90 mL/min (ref >90)

## 2013-01-12 MED ORDER — GADOBENATE DIMEGLUMINE 529 MG/ML IV SOLN
16.0000 mL | Freq: Once | INTRAVENOUS | Status: AC | PRN
  Administered 2013-01-12: 16 mL via INTRAVENOUS

## 2014-02-17 IMAGING — CT CT ABD-PELV W/ CM
2 of 5 series · 17 of 46 positions shown, 19 images · IV contrast (READICAT/WATER & [ID] OMNI 300)
Comparison: 05/28/2008.

CLINICAL DATA: Abdominal pain.  Colonoscopy on 01/09/2012.  Now
with melanoma and general abdominal pain.

CT ABDOMEN AND PELVIS WITH CONTRAST
TECHNIQUE: Multidetector CT imaging of the abdomen and pelvis was
performed following the standard protocol during bolus
administration of intravenous contrast.
Contrast: 100mL OMNIPAQUE IOHEXOL 300 MG/ML  SOLN

[Series 2: abd/pelvis with · axial · 0.78mm/px · z∈[-384,+66]mm · 14 of 101 slices shown, 16 images]
[im 6/101  soft-tissue]
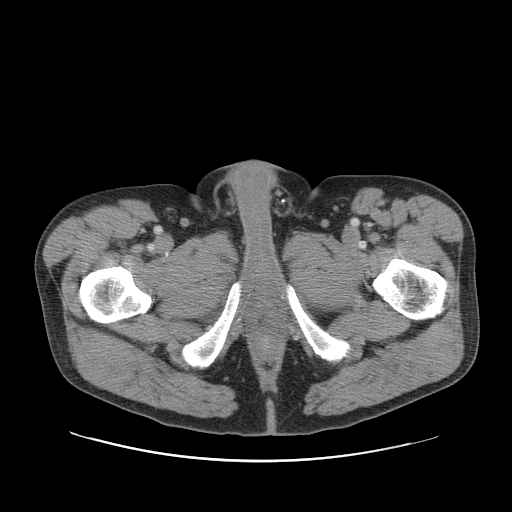
[im 6/101  bone]
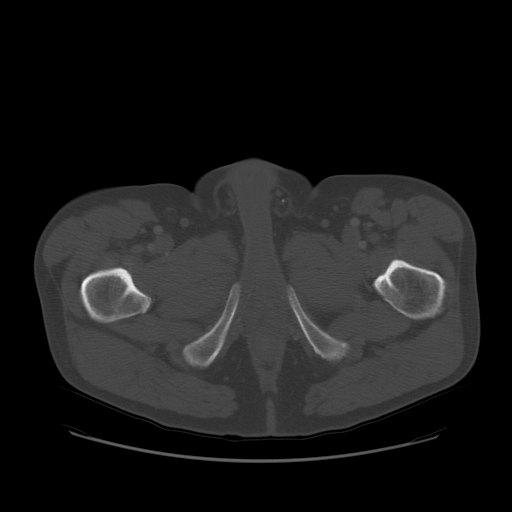
[im 16/101  soft-tissue]
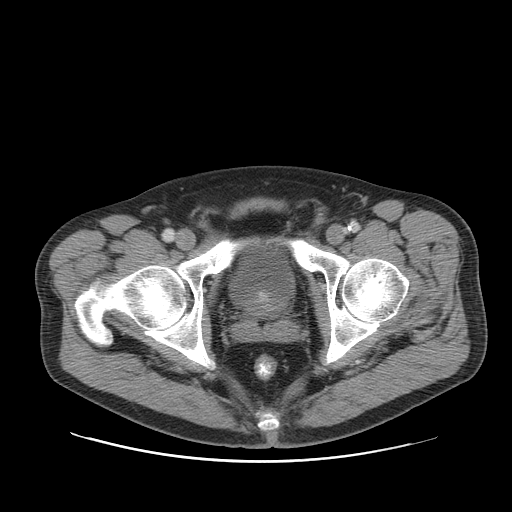
[im 21/101  soft-tissue]
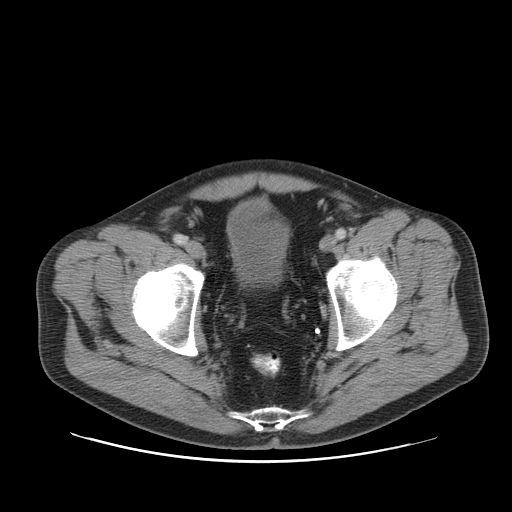
[im 26/101  soft-tissue]
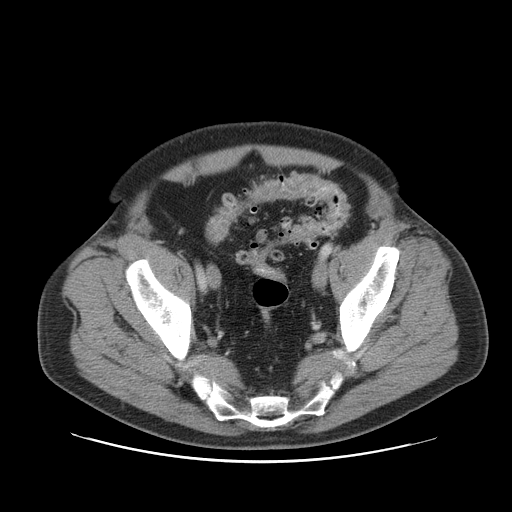
[im 36/101  soft-tissue]
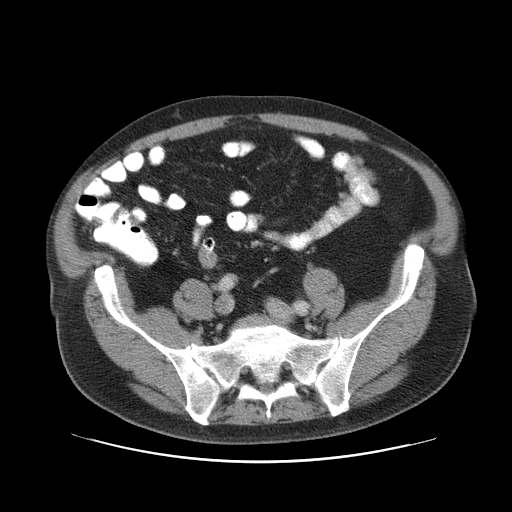
[im 41/101  soft-tissue]
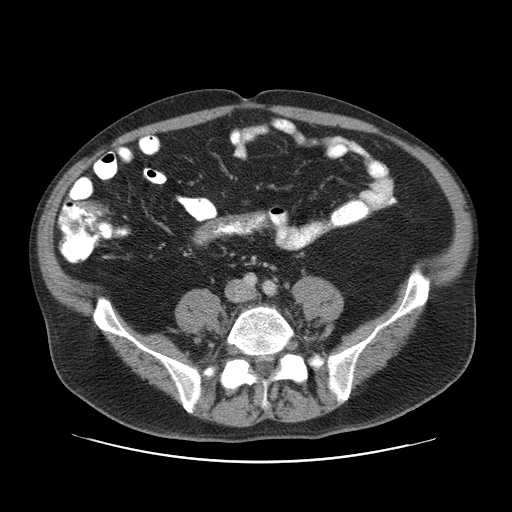
[im 46/101  soft-tissue]
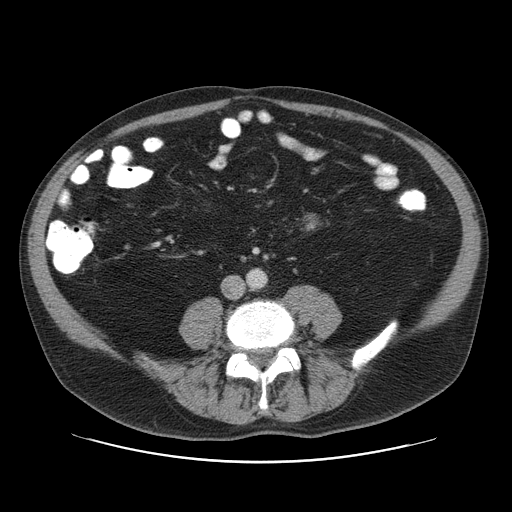
[im 56/101  soft-tissue]
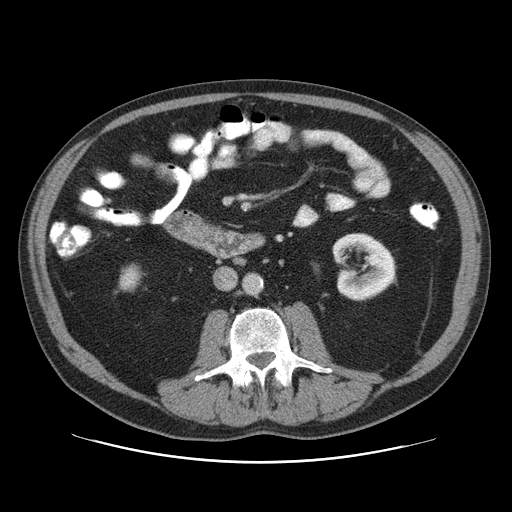
[im 61/101  soft-tissue]
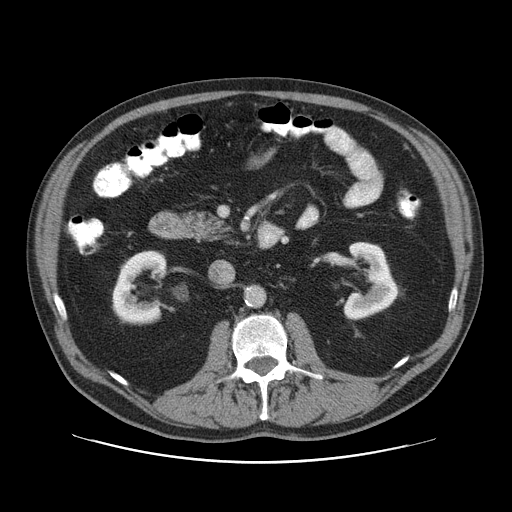
[im 61/101  bone]
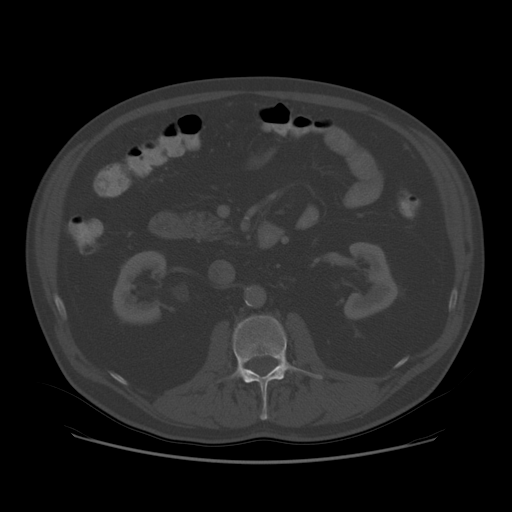
[im 66/101  soft-tissue]
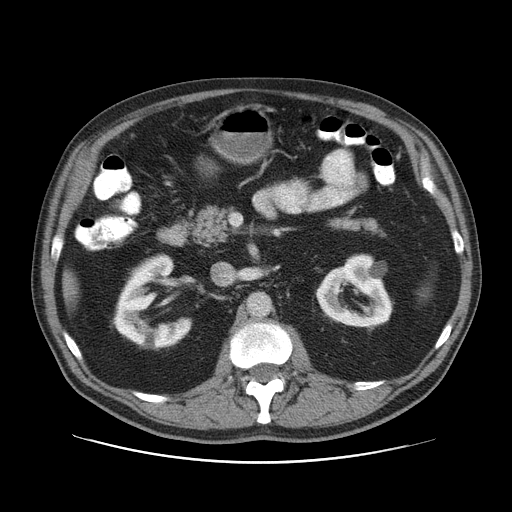
[im 76/101  soft-tissue]
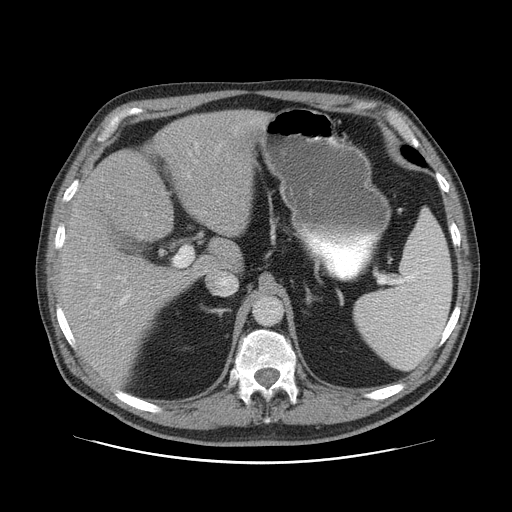
[im 81/101  soft-tissue]
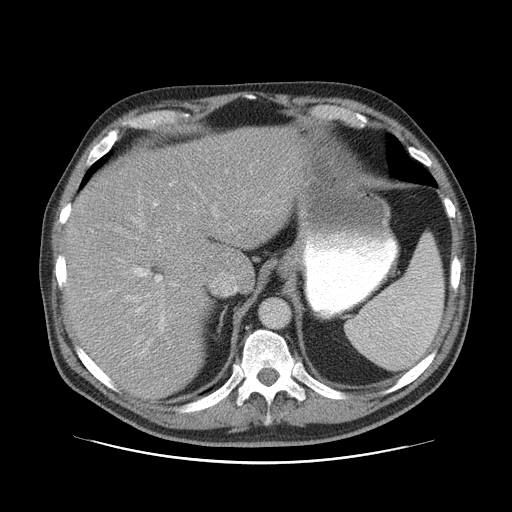
[im 86/101  soft-tissue]
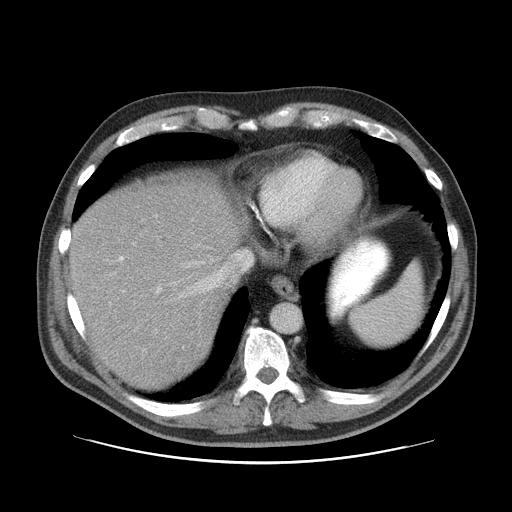
[im 96/101  soft-tissue]
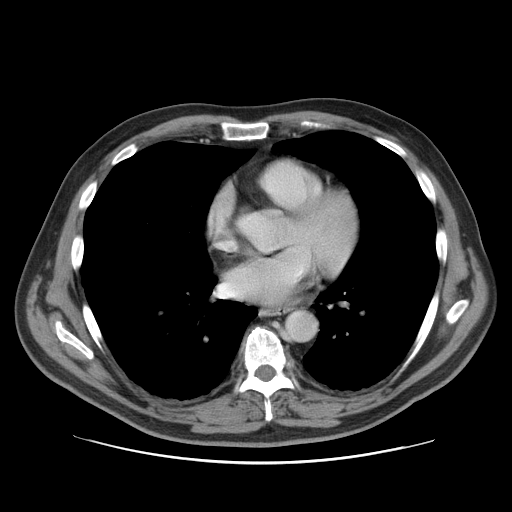

[Series 400: coronal · coronal · 1.00mm/px · 3 of 129 slices shown]
[im 43/129  soft-tissue]
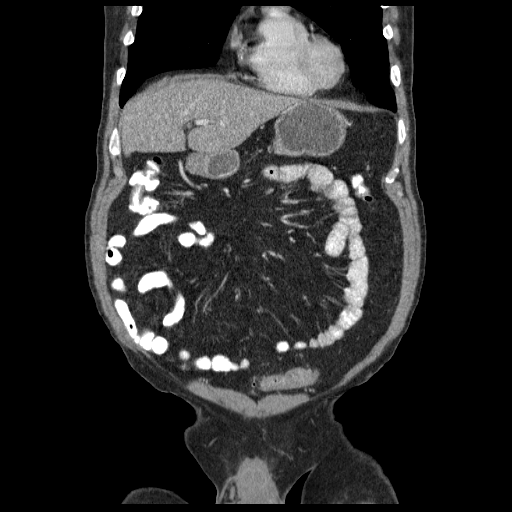
[im 57/129  soft-tissue]
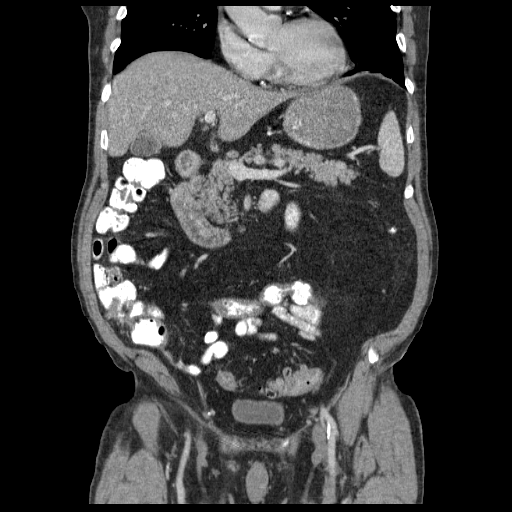
[im 72/129  soft-tissue]
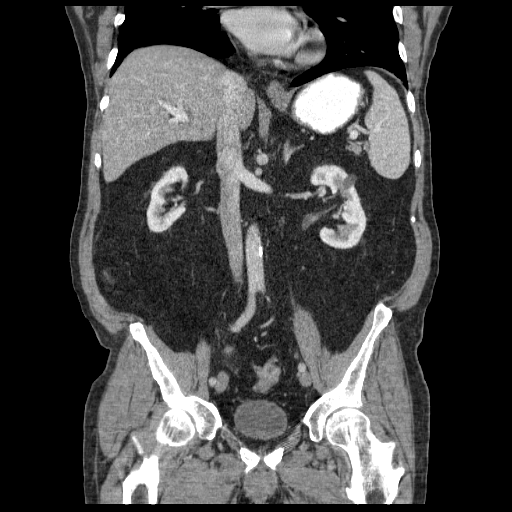

[17 of 46 positions shown; findings below may reference images not displayed]

FINDINGS: Stable small low density lesion in the dome of the liver.
Spleen is unremarkable.  Small lymph node adjacent to the distal
esophagus is stable.  Stomach, duodenum, pancreas, gallbladder, and
adrenal glands are unremarkable.  Small bilateral low-density
cortical lesions in the kidneys are stable.  Although too small to
characterize, and likely represents cyst.

No abdominal aortic aneurysm.  Borderline enlarged lymph nodes in
the porta hepatis are stable.  No free fluid in the abdomen.

Imaging through the pelvis shows no free fluid.  There is no pelvic
sidewall lymphadenopathy.  Bladder is unremarkable.  Prostate gland
is enlarged.  Bilateral inguinal hernias contain only fat.

Advanced diverticular changes are seen in the sigmoid colon. There
is some wall thickening in the sigmoid colon which could be related
to subsegmental colitis or subtle diverticulitis.  No extraluminal
gas.  No evidence for abscess.

Terminal ileum is normal. The appendix is not visualized, but there
is no edema or inflammation in the region of the cecum.

Bone windows reveal no worrisome lytic or sclerotic osseous
lesions.
IMPRESSION: No evidence for intraperitoneal or retroperitoneal free gas.

Advanced diverticulosis of the sigmoid colon with some subtle
colonic wall thickening in this region. While not definite, a
component of mild colitis or diverticulitis cannot be completely
excluded.

## 2014-11-18 ENCOUNTER — Telehealth: Payer: Self-pay | Admitting: Oncology

## 2014-11-18 NOTE — Telephone Encounter (Signed)
Pt confirmed appt for 12/17/14 at 1:30pm w/Shadad Dx:  Abnormal CBC WBC 20.2 Referring: Dr. Tollie Pizza

## 2014-11-19 ENCOUNTER — Telehealth: Payer: Self-pay | Admitting: Oncology

## 2014-11-19 NOTE — Telephone Encounter (Signed)
Chart delivered 11/19/14.  TG

## 2014-11-22 ENCOUNTER — Telehealth: Payer: Self-pay | Admitting: Oncology

## 2014-11-22 NOTE — Telephone Encounter (Signed)
Chart delivered 11/22/14

## 2014-12-10 ENCOUNTER — Encounter (HOSPITAL_BASED_OUTPATIENT_CLINIC_OR_DEPARTMENT_OTHER): Payer: Self-pay

## 2014-12-10 ENCOUNTER — Emergency Department (HOSPITAL_BASED_OUTPATIENT_CLINIC_OR_DEPARTMENT_OTHER)
Admission: EM | Admit: 2014-12-10 | Discharge: 2014-12-10 | Disposition: A | Discharge status: Home / Self Care | Payer: Medicare Other | Attending: Emergency Medicine | Admitting: Emergency Medicine

## 2014-12-10 ENCOUNTER — Emergency Department (HOSPITAL_BASED_OUTPATIENT_CLINIC_OR_DEPARTMENT_OTHER): Payer: Medicare Other

## 2014-12-10 DIAGNOSIS — Z79899 Other long term (current) drug therapy: Secondary | ICD-10-CM | POA: Diagnosis not present

## 2014-12-10 DIAGNOSIS — E780 Pure hypercholesterolemia: Secondary | ICD-10-CM | POA: Diagnosis not present

## 2014-12-10 DIAGNOSIS — Z792 Long term (current) use of antibiotics: Secondary | ICD-10-CM | POA: Diagnosis not present

## 2014-12-10 DIAGNOSIS — K5732 Diverticulitis of large intestine without perforation or abscess without bleeding: Secondary | ICD-10-CM | POA: Insufficient documentation

## 2014-12-10 DIAGNOSIS — I10 Essential (primary) hypertension: Secondary | ICD-10-CM | POA: Diagnosis not present

## 2014-12-10 DIAGNOSIS — M109 Gout, unspecified: Secondary | ICD-10-CM | POA: Diagnosis not present

## 2014-12-10 DIAGNOSIS — R1030 Lower abdominal pain, unspecified: Secondary | ICD-10-CM | POA: Diagnosis present

## 2014-12-10 LAB — COMPREHENSIVE METABOLIC PANEL
ALBUMIN: 4.4 g/dL (ref 3.5–5.2)
ALT: 24 U/L (ref 0–53)
AST: 28 U/L (ref 0–37)
Alkaline Phosphatase: 75 U/L (ref 39–117)
Anion gap: 9 (ref 5–15)
BILIRUBIN TOTAL: 1.7 mg/dL — AB (ref 0.3–1.2)
BUN: 8 mg/dL (ref 6–23)
CHLORIDE: 102 mmol/L (ref 96–112)
CO2: 31 mmol/L (ref 19–32)
CREATININE: 1.07 mg/dL (ref 0.50–1.35)
Calcium: 9.1 mg/dL (ref 8.4–10.5)
GFR, EST AFRICAN AMERICAN: 77 mL/min — AB (ref >90)
GFR, EST NON AFRICAN AMERICAN: 66 mL/min — AB (ref >90)
Glucose, Bld: 115 mg/dL — ABNORMAL HIGH (ref 70–99)
Potassium: 4.6 mmol/L (ref 3.5–5.1)
Sodium: 142 mmol/L (ref 135–145)
TOTAL PROTEIN: 7.4 g/dL (ref 6.0–8.3)

## 2014-12-10 LAB — CBC WITH DIFFERENTIAL/PLATELET
BASOS ABS: 0 10*3/uL (ref 0.0–0.1)
Basophils Relative: 0 % (ref 0–1)
EOS ABS: 0 10*3/uL (ref 0.0–0.7)
Eosinophils Relative: 0 % (ref 0–5)
HEMATOCRIT: 54.5 % — AB (ref 39.0–52.0)
Hemoglobin: 18.3 g/dL — ABNORMAL HIGH (ref 13.0–17.0)
LYMPHS ABS: 11.2 10*3/uL — AB (ref 0.7–4.0)
LYMPHS PCT: 53 % — AB (ref 12–46)
MCH: 31.1 pg (ref 26.0–34.0)
MCHC: 33.6 g/dL (ref 30.0–36.0)
MCV: 92.7 fL (ref 78.0–100.0)
Monocytes Absolute: 0.9 10*3/uL (ref 0.1–1.0)
Monocytes Relative: 4 % (ref 3–12)
NEUTROS ABS: 9.2 10*3/uL — AB (ref 1.7–7.7)
Neutrophils Relative %: 43 % (ref 43–77)
PLATELETS: 152 10*3/uL (ref 150–400)
RBC: 5.88 MIL/uL — ABNORMAL HIGH (ref 4.22–5.81)
RDW: 13.4 % (ref 11.5–15.5)
WBC: 21.3 10*3/uL — ABNORMAL HIGH (ref 4.0–10.5)

## 2014-12-10 LAB — URINALYSIS, ROUTINE W REFLEX MICROSCOPIC
BILIRUBIN URINE: NEGATIVE
Glucose, UA: NEGATIVE mg/dL
HGB URINE DIPSTICK: NEGATIVE
Ketones, ur: NEGATIVE mg/dL
Leukocytes, UA: NEGATIVE
Nitrite: NEGATIVE
PH: 7.5 (ref 5.0–8.0)
PROTEIN: NEGATIVE mg/dL
SPECIFIC GRAVITY, URINE: 1.014 (ref 1.005–1.030)
Urobilinogen, UA: 0.2 mg/dL (ref 0.0–1.0)

## 2014-12-10 LAB — LIPASE, BLOOD: Lipase: 22 U/L (ref 11–59)

## 2014-12-10 MED ORDER — CIPROFLOXACIN IN D5W 400 MG/200ML IV SOLN
400.0000 mg | Freq: Once | INTRAVENOUS | Status: AC
  Administered 2014-12-10: 400 mg via INTRAVENOUS
  Filled 2014-12-10: qty 200

## 2014-12-10 MED ORDER — ONDANSETRON HCL 4 MG/2ML IJ SOLN
4.0000 mg | Freq: Once | INTRAMUSCULAR | Status: AC
  Administered 2014-12-10: 4 mg via INTRAVENOUS
  Filled 2014-12-10: qty 2

## 2014-12-10 MED ORDER — CIPROFLOXACIN HCL 500 MG PO TABS: 500.0000 mg | ORAL_TABLET | Freq: Two times a day (BID) | ORAL | Status: DC

## 2014-12-10 MED ORDER — METRONIDAZOLE IN NACL 5-0.79 MG/ML-% IV SOLN
500.0000 mg | Freq: Once | INTRAVENOUS | Status: AC
  Administered 2014-12-10: 500 mg via INTRAVENOUS
  Filled 2014-12-10: qty 100

## 2014-12-10 MED ORDER — OXYCODONE-ACETAMINOPHEN 5-325 MG PO TABS: 1.0000 | ORAL_TABLET | ORAL | Status: DC | PRN

## 2014-12-10 MED ORDER — IOHEXOL 300 MG/ML  SOLN
50.0000 mL | Freq: Once | INTRAMUSCULAR | Status: AC | PRN
  Administered 2014-12-10: 50 mL via ORAL

## 2014-12-10 MED ORDER — ONDANSETRON 8 MG PO TBDP: 8.0000 mg | ORAL_TABLET | Freq: Three times a day (TID) | ORAL | Status: DC | PRN

## 2014-12-10 MED ORDER — IOHEXOL 300 MG/ML  SOLN
100.0000 mL | Freq: Once | INTRAMUSCULAR | Status: AC | PRN
Start: 2014-12-10 — End: 2014-12-10
  Administered 2014-12-10: 100 mL via INTRAVENOUS

## 2014-12-10 MED ORDER — METRONIDAZOLE 500 MG PO TABS: 500.0000 mg | ORAL_TABLET | Freq: Two times a day (BID) | ORAL | Status: DC

## 2014-12-10 MED ORDER — MORPHINE SULFATE 2 MG/ML IJ SOLN: 2.0000 mg | Freq: Once | INTRAMUSCULAR | Status: DC

## 2014-12-10 NOTE — ED Notes (Signed)
MD at bedside. 

## 2014-12-10 NOTE — ED Notes (Signed)
Adam, Tangent notified pt has completed oral contrast

## 2014-12-10 NOTE — ED Notes (Signed)
Pt reports abdominal pain, nausea with diverticulitis flare x2 days.

## 2014-12-10 NOTE — ED Provider Notes (Signed)
CSN: [id number]     Arrival date & time 12/10/14  1332 History   First MD Initiated Contact with Patient 12/10/14 1554     Chief Complaint  Patient presents with  . Abdominal Pain     (Consider location/radiation/quality/duration/timing/severity/associated sxs/prior Treatment) HPI  74 y.o. Male complaining of lower abdominal pain which began yesterday. It is crampy and painful at a 7-8 out of 10. The pain is located in the lower abdomen and does not radiate. He has had nausea but no vomiting. He describes it as similar to previous episodes of diverticulitis. He has not had a fever but has had some chills. Has had poor appetite today but has been taking in clear liquids. He states he has had some change in his urinary habits haven't wake up and urinate at night. He has not had any diarrhea and has had normal stooling.  Past Medical History  Diagnosis Date  . Diverticulitis   . Gout   . Hypercholesteremia   . Hypertension    Past Surgical History  Procedure Laterality Date  . Appendectomy     History reviewed. No pertinent family history. History  Substance Use Topics  . Smoking status: Never Smoker   . Smokeless tobacco: Not on file  . Alcohol Use: Yes     Comment: occasional    Review of Systems  All other systems reviewed and are negative.     Allergies  Review of patient's allergies indicates no known allergies.  Home Medications   Prior to Admission medications   Medication Sig Start Date End Date Taking? Authorizing Provider  allopurinol (ZYLOPRIM) 300 MG tablet Take 300 mg by mouth daily.   Yes Historical Provider, MD  amLODipine (NORVASC) 5 MG tablet Take 5 mg by mouth daily.   Yes Historical Provider, MD  loratadine (CLARITIN) 10 MG tablet Take 10 mg by mouth daily.   Yes Historical Provider, MD  omeprazole (PRILOSEC) 20 MG capsule Take 20 mg by mouth daily.   Yes Historical Provider, MD  pravastatin (PRAVACHOL) 40 MG tablet Take 40 mg by mouth daily.   Yes  Historical Provider, MD  Tamsulosin HCl (FLOMAX) 0.4 MG CAPS Take by mouth.   Yes Historical Provider, MD  ciprofloxacin (CIPRO) 500 MG tablet Take 1 tablet (500 mg total) by mouth every 12 (twelve) hours. 11/13/12   Julianne Rice, MD  HYDROcodone-acetaminophen (NORCO/VICODIN) 5-325 MG per tablet Take 1 tablet by mouth every 4 (four) hours as needed for pain. 11/13/12   Julianne Rice, MD  metroNIDAZOLE (FLAGYL) 500 MG tablet Take 1 tablet (500 mg total) by mouth 3 (three) times daily. 11/13/12   Julianne Rice, MD  ondansetron (ZOFRAN ODT) 8 MG disintegrating tablet Take 1 tablet (8 mg total) by mouth every 8 (eight) hours as needed for nausea. 06/04/12   Jola Schmidt, MD  ondansetron (ZOFRAN) 4 MG tablet Take 1 tablet (4 mg total) by mouth every 6 (six) hours. 11/13/12   Julianne Rice, MD   BP 168/77 mmHg  Pulse 75  Temp(Src) 97.6 F (36.4 C) (Oral)  Resp 18  Ht 5\' 9"  (1.753 m)  Wt 175 lb (79.379 kg)  BMI 25.83 kg/m2  SpO2 96% Physical Exam  Constitutional: He is oriented to person, place, and time. He appears well-developed and well-nourished.  HENT:  Head: Normocephalic and atraumatic.  Right Ear: External ear normal.  Left Ear: External ear normal.  Nose: Nose normal.  Mouth/Throat: Oropharynx is clear and moist.  Eyes: Conjunctivae and EOM are normal.  Pupils are equal, round, and reactive to light.  Neck: Normal range of motion. Neck supple.  Cardiovascular: Normal rate, regular rhythm, normal heart sounds and intact distal pulses.   Pulmonary/Chest: Effort normal and breath sounds normal.  Abdominal: Soft. Bowel sounds are normal. There is tenderness.    Musculoskeletal: Normal range of motion.  Neurological: He is alert and oriented to person, place, and time. He has normal reflexes.  Skin: Skin is warm and dry.  Psychiatric: He has a normal mood and affect. His behavior is normal. Judgment and thought content normal.  Nursing note and vitals reviewed.   ED Course    Procedures (including critical care time) Labs Review Labs Reviewed  CBC WITH DIFFERENTIAL/PLATELET - Abnormal; Notable for the following:    WBC 21.3 (*)    RBC 5.88 (*)    Hemoglobin 18.3 (*)    HCT 54.5 (*)    All other components within normal limits  COMPREHENSIVE METABOLIC PANEL - Abnormal; Notable for the following:    Glucose, Bld 115 (*)    Total Bilirubin 1.7 (*)    GFR calc non Af Amer 66 (*)    GFR calc Af Amer 77 (*)    All other components within normal limits  LIPASE, BLOOD  URINALYSIS, ROUTINE W REFLEX MICROSCOPIC    Imaging Review Ct Abdomen Pelvis W Contrast  12/10/2014   CLINICAL DATA:  Left lower abdominal pain  EXAM: CT ABDOMEN AND PELVIS WITH CONTRAST  TECHNIQUE: Multidetector CT imaging of the abdomen and pelvis was performed using the standard protocol following bolus administration of intravenous contrast.  CONTRAST:  12mL OMNIPAQUE IOHEXOL 300 MG/ML SOLN, 131mL OMNIPAQUE IOHEXOL 300 MG/ML SOLN  COMPARISON:  01/12/2013 MRI, 11/13/2012 CT  FINDINGS: Mild linear lung base opacities, favored to reflect atelectasis or scarring. Normal heart size. Coronary artery calcifications. Trace pericardial fluid.  Decreased hepatic attenuation is most in keeping with steatosis. There are rounded areas of fatty sparing. Unable to exclude lesions. Small cyst within the right hepatic lobe. Thin walled gallbladder. No radiodense gallstones or biliary ductal dilatation. No appreciable abnormality of the spleen, pancreas, adrenal glands.  Bilateral renal cysts. A hyperdense lesion exophytic from the lower pole left kidney is indeterminate by CT however described as a benign cyst on prior MRI. No hydroureteronephrosis.  Colonic diverticulosis. Thickened segment of sigmoid colon with pericolonic fat stranding and ill-defined fluid. No free intraperitoneal air or loculated fluid collection. No bowel obstruction. Appendix not identified. No right lower quadrant inflammation. Small bowel loops  are of normal course and caliber. Mildly prominent lymph nodes along the retroperitoneum and IMA, similar to minimally increased. As index, aortocaval node on series 2, image 44 measuring 1 cm short axis.  Thin walled bladder. Bilateral fat containing inguinal hernias. Prostate gland is is upper normal at 4.9 cm  Multilevel degenerative changes. No acute osseous finding. Scattered atherosclerotic disease of the aorta and branch vessels without aneurysmal dilatation.  IMPRESSION: Findings are most in keeping with sigmoid colon diverticulitis. Recommend colonoscopy follow-up if not recently performed to exclude an underlying mass.  Mildly prominent retroperitoneal lymph nodes are nonspecific and may be reactive.  Hepatic steatosis. Areas of relative hyperattenuation may reflect fatty sparing. Cannot exclude underlying lesions. Recommend liver MRI follow-up.   Electronically Signed   By: Carlos Levering M.D.   On: 12/10/2014 18:53     EKG Interpretation None      MDM   Final diagnoses:  Diverticulitis of large intestine without perforation or abscess without  bleeding   74 year old male history of diverticulitis comes in today with symptoms consistent with diverticulitis for several days. CT scan is consistent with diverticulitis. Patient's white blood cell count is elevated at 21,000. I have advised the patient received IV antibiotics and be hospitalized, however the patient does not wish to be hospitalized. He is able to take by mouth fluids and is able to take his antibiotics at home. He is advised of return precautions and need for close follow-up and voices understanding.    Pattricia Boss, MD 12/10/14 [phone]

## 2014-12-10 NOTE — Discharge Instructions (Signed)
Please return to the emergency department if you are worse at any time with increased pain or unable to tolerate fluids. Please recheck with your doctor on Monday.  Diverticulitis Diverticulitis is when small pockets that have formed in your colon (large intestine) become infected or swollen. HOME CARE  Follow your doctor's instructions.  Follow a special diet if told by your doctor.  When you feel better, your doctor may tell you to change your diet. You may be told to eat a lot of fiber. Fruits and vegetables are good sources of fiber. Fiber makes it easier to poop (have bowel movements).  Take supplements or probiotics as told by your doctor.  Only take medicines as told by your doctor.  Keep all follow-up visits with your doctor. GET HELP IF:  Your pain does not get better.  You have a hard time eating food.  You are not pooping like normal. GET HELP RIGHT AWAY IF:  Your pain gets worse.  Your problems do not get better.  Your problems suddenly get worse.  You have a fever.  You keep throwing up (vomiting).  You have bloody or black, tarry poop (stool). MAKE SURE YOU:   Understand these instructions.  Will watch your condition.  Will get help right away if you are not doing well or get worse. Document Released: 02/27/2008 Document Revised: 09/15/2013 Document Reviewed: 08/05/2013 [facility] Patient Information 2015 [place], [place]. This information is not intended to replace advice given to you by your health care provider. Make sure you discuss any questions you have with your health care provider.

## 2014-12-13 LAB — PATHOLOGIST SMEAR REVIEW

## 2014-12-17 ENCOUNTER — Ambulatory Visit (HOSPITAL_BASED_OUTPATIENT_CLINIC_OR_DEPARTMENT_OTHER): Payer: Medicare Other | Admitting: Oncology

## 2014-12-17 ENCOUNTER — Other Ambulatory Visit: Payer: Self-pay

## 2014-12-17 ENCOUNTER — Ambulatory Visit: Payer: Medicare Other

## 2014-12-17 VITALS — BP 138/84 | HR 69 | Temp 98.2°F | Resp 19 | Ht 69.0 in | Wt 169.7 lb

## 2014-12-17 DIAGNOSIS — D72820 Lymphocytosis (symptomatic): Secondary | ICD-10-CM

## 2014-12-17 DIAGNOSIS — D582 Other hemoglobinopathies: Secondary | ICD-10-CM | POA: Diagnosis not present

## 2014-12-17 DIAGNOSIS — D72829 Elevated white blood cell count, unspecified: Secondary | ICD-10-CM

## 2014-12-17 NOTE — Progress Notes (Signed)
Please see consult

## 2014-12-17 NOTE — Consult Note (Signed)
Reason for Referral: Leukocytosis.   HPI: 74 year old gentleman with history of hypertension, diverticulosis and hyperlipidemia referred to me for evaluation of leukocytosis and mild lymphocytosis. He was in his usual state of health until he presented to the emergency department on 12/10/2014 with symptoms of abdominal pain, anorexia and was diagnosed with diverticulitis. He was discharged on oral antibiotics which he has taken since that time. His symptoms are started to improve in the last few days and certainly today is better than yesterday. He has not reported any fevers, chills and his appetite have improved slightly. He is not reporting any nausea or vomiting. His abdominal pain is improving. During his evaluation, he was noted to have white cell count of 21,000 with a hemoglobin of 18.3, throat count of 152. He had slight increase in lymphocyte count up to 53% upper limit of normal was 47. Imaging studies of the abdomen showed evidence of diverticulitis and borderline enlarged retroperitoneal lymph nodes which could be reactive. He reports he had a similar episode back in February 2014 and at that time his white cell count was elevated at 12.2 with a normal lymphocyte percentage. His hemoglobin was 18.3. A CBC on 06/04/2012 showed white cell count of 8.2, hemoglobin 18.0 and platelet count of 139.  Currently he does not report any headaches, blurry vision, double vision, syncope or seizure. He does not report any fevers, chills, sweats he did report about 5 pound weight loss a lot of it is intentional. He does not report any night sweats or fevers or chills. He does not report any chest pain, palpitation, orthopnea or leg edema. He does not report any cough, hemoptysis or hematemesis. He does not report any nausea, vomiting does report abdominal pain but no hematochezia or melena. He does report loose bowel movement but no severe diarrhea. He does not report any frequency, urgency, hesitancy. He does  not report any skeletal complaints. He does not report any lymphadenopathy or petechiae. Remaining review of systems unremarkable.    Past Medical History  Diagnosis Date  . Diverticulitis   . Gout   . Hypercholesteremia   . Hypertension   :  Past Surgical History  Procedure Laterality Date  . Appendectomy    :   Current outpatient prescriptions:  .  allopurinol (ZYLOPRIM) 300 MG tablet, Take 300 mg by mouth daily., Disp: , Rfl:  .  amLODipine (NORVASC) 5 MG tablet, Take 5 mg by mouth daily., Disp: , Rfl:  .  ciprofloxacin (CIPRO) 500 MG tablet, Take 1 tablet (500 mg total) by mouth every 12 (twelve) hours., Disp: 20 tablet, Rfl: 0 .  HYDROcodone-acetaminophen (NORCO/VICODIN) 5-325 MG per tablet, Take 1 tablet by mouth every 4 (four) hours as needed for pain., Disp: 10 tablet, Rfl: 0 .  loratadine (CLARITIN) 10 MG tablet, Take 10 mg by mouth daily., Disp: , Rfl:  .  metroNIDAZOLE (FLAGYL) 500 MG tablet, Take 1 tablet (500 mg total) by mouth 2 (two) times daily., Disp: 20 tablet, Rfl: 0 .  omeprazole (PRILOSEC) 20 MG capsule, Take 20 mg by mouth daily., Disp: , Rfl:  .  ondansetron (ZOFRAN ODT) 8 MG disintegrating tablet, Take 1 tablet (8 mg total) by mouth every 8 (eight) hours as needed for nausea or vomiting., Disp: 20 tablet, Rfl: 0 .  oxyCODONE-acetaminophen (PERCOCET/ROXICET) 5-325 MG per tablet, Take 1 tablet by mouth every 4 (four) hours as needed for severe pain., Disp: 10 tablet, Rfl: 0 .  pravastatin (PRAVACHOL) 40 MG tablet, Take 40 mg  by mouth daily., Disp: , Rfl:  .  Tamsulosin HCl (FLOMAX) 0.4 MG CAPS, Take by mouth., Disp: , Rfl: :  No Known Allergies:  No family history on file.:  History   Social History  . Marital Status: Divorced    Spouse Name: N/A  . Number of Children: N/A  . Years of Education: N/A   Occupational History  . Not on file.   Social History Main Topics  . Smoking status: Never Smoker   . Smokeless tobacco: Not on file  . Alcohol  Use: Yes     Comment: occasional  . Drug Use: No  . Sexual Activity: Not on file   Other Topics Concern  . Not on file   Social History Narrative  :  Pertinent items are noted in HPI.  Exam: ECOG 0 Blood pressure 138/84, pulse 69, temperature 98.2 F (36.8 C), temperature source Oral, resp. rate 19, height 5\' 9"  (1.753 m), weight 169 lb 11.2 oz (76.975 kg), SpO2 98 %. General appearance: alert and cooperative Head: Normocephalic, without obvious abnormality Throat: lips, mucosa, and tongue normal; teeth and gums normal Neck: no adenopathy Back: negative Resp: clear to auscultation bilaterally Chest wall: no tenderness Cardio: regular rate and rhythm, S1, S2 normal, no murmur, click, rub or gallop GI: soft, non-tender; bowel sounds normal; no masses,  no organomegaly Extremities: extremities normal, atraumatic, no cyanosis or edema Pulses: 2+ and symmetric Skin: Skin color, texture, turgor normal. No rashes or lesions  CBC    Component Value Date/Time   WBC 21.3* 12/10/2014 1530   RBC 5.88* 12/10/2014 1530   HGB 18.3* 12/10/2014 1530   HCT 54.5* 12/10/2014 1530   PLT 152 12/10/2014 1530   MCV 92.7 12/10/2014 1530   MCH 31.1 12/10/2014 1530   MCHC 33.6 12/10/2014 1530   RDW 13.4 12/10/2014 1530   LYMPHSABS 11.2* 12/10/2014 1530   MONOABS 0.9 12/10/2014 1530   EOSABS 0.0 12/10/2014 1530   BASOSABS 0.0 12/10/2014 1530      Chemistry      Component Value Date/Time   NA 142 12/10/2014 1530   K 4.6 12/10/2014 1530   CL 102 12/10/2014 1530   CO2 31 12/10/2014 1530   BUN 8 12/10/2014 1530   CREATININE 1.07 12/10/2014 1530      Component Value Date/Time   CALCIUM 9.1 12/10/2014 1530   ALKPHOS 75 12/10/2014 1530   AST 28 12/10/2014 1530   ALT 24 12/10/2014 1530   BILITOT 1.7* 12/10/2014 1530       Ct Abdomen Pelvis W Contrast  12/10/2014   CLINICAL DATA:  Left lower abdominal pain  EXAM: CT ABDOMEN AND PELVIS WITH CONTRAST  TECHNIQUE: Multidetector CT imaging  of the abdomen and pelvis was performed using the standard protocol following bolus administration of intravenous contrast.  CONTRAST:  32mL OMNIPAQUE IOHEXOL 300 MG/ML SOLN, 131mL OMNIPAQUE IOHEXOL 300 MG/ML SOLN  COMPARISON:  01/12/2013 MRI, 11/13/2012 CT  FINDINGS: Mild linear lung base opacities, favored to reflect atelectasis or scarring. Normal heart size. Coronary artery calcifications. Trace pericardial fluid.  Decreased hepatic attenuation is most in keeping with steatosis. There are rounded areas of fatty sparing. Unable to exclude lesions. Small cyst within the right hepatic lobe. Thin walled gallbladder. No radiodense gallstones or biliary ductal dilatation. No appreciable abnormality of the spleen, pancreas, adrenal glands.  Bilateral renal cysts. A hyperdense lesion exophytic from the lower pole left kidney is indeterminate by CT however described as a benign cyst on prior MRI. No  hydroureteronephrosis.  Colonic diverticulosis. Thickened segment of sigmoid colon with pericolonic fat stranding and ill-defined fluid. No free intraperitoneal air or loculated fluid collection. No bowel obstruction. Appendix not identified. No right lower quadrant inflammation. Small bowel loops are of normal course and caliber. Mildly prominent lymph nodes along the retroperitoneum and IMA, similar to minimally increased. As index, aortocaval node on series 2, image 44 measuring 1 cm short axis.  Thin walled bladder. Bilateral fat containing inguinal hernias. Prostate gland is is upper normal at 4.9 cm  Multilevel degenerative changes. No acute osseous finding. Scattered atherosclerotic disease of the aorta and branch vessels without aneurysmal dilatation.  IMPRESSION: Findings are most in keeping with sigmoid colon diverticulitis. Recommend colonoscopy follow-up if not recently performed to exclude an underlying mass.  Mildly prominent retroperitoneal lymph nodes are nonspecific and may be reactive.  Hepatic steatosis.  Areas of relative hyperattenuation may reflect fatty sparing. Cannot exclude underlying lesions. Recommend liver MRI follow-up.   Electronically Signed   By: Carlos Levering M.D.   On: 12/10/2014 18:53    Assessment and Plan:    74 year old gentleman with the following issues:  1. Leukocytosis with mild lymphocytosis: The differential diagnosis was discussed with the patient today. These findings have coincided with 2 episodes of diverticulitis flares. The most recent was in 12/10/2014 and his white cell count appropriately high at 20,000 with diverticulitis. The previous episode 2 years ago also had an elevated white cell count associated with it.  Also on the consideration would be a lymphoproliferative disorders. Condition such as CLL, mantle cell lymphoma among others would be considered. Myeloproliferative disorders such as CML, polycythemia vera as well as idiopathic fibrosis also on the differential.  Imaging studies of the abdomen and pelvis have been done repeatedly without any clear-cut evidence of splenomegaly or lymphadenopathy. He did have mildly prominent lymph glands in the retroperitoneum that are likely reactive in nature.  From a management standpoint, I have recommended repeating his CBC once his diverticulitis flare subsides. If he continuously having elevated white cell count with abnormal differential then further workup will be needed. The workup would include peripheral blood flow cytometry to determine whether he has a lymphoproliferative disorder. If he has neutrophilia and monocytosis workup for CML or polycythemia vera would be warranted with a BCR ABL or JAK2 mutation testing.  I offered him an appointment to repeat his CBC in 3 months and a follow-up at that time but he elected to have this done with his primary care physician and he'll contact me if his white cell count continued to be elevated and once this workup to be done.  2. Elevated hemoglobin: Prescription be  related to reactive process as well but could also be a part of a myeloproliferative process. A repeat CBC as mentioned previously will help down the line.  All his questions were answered today and I will be happy to see him in the future if his white cell count continues to be elevated after his diverticulitis have subsided.

## 2015-06-08 ENCOUNTER — Other Ambulatory Visit: Payer: Self-pay | Admitting: Orthopedic Surgery

## 2015-06-08 DIAGNOSIS — M545 Low back pain: Secondary | ICD-10-CM

## 2015-06-14 ENCOUNTER — Emergency Department (HOSPITAL_BASED_OUTPATIENT_CLINIC_OR_DEPARTMENT_OTHER): Payer: Medicare Other

## 2015-06-14 ENCOUNTER — Encounter (HOSPITAL_BASED_OUTPATIENT_CLINIC_OR_DEPARTMENT_OTHER): Payer: Self-pay | Admitting: Emergency Medicine

## 2015-06-14 ENCOUNTER — Emergency Department (HOSPITAL_BASED_OUTPATIENT_CLINIC_OR_DEPARTMENT_OTHER)
Admission: EM | Admit: 2015-06-14 | Discharge: 2015-06-15 | Disposition: A | Discharge status: Home / Self Care | Payer: Medicare Other | Attending: Emergency Medicine | Admitting: Emergency Medicine

## 2015-06-14 DIAGNOSIS — R1032 Left lower quadrant pain: Secondary | ICD-10-CM | POA: Diagnosis present

## 2015-06-14 DIAGNOSIS — K5900 Constipation, unspecified: Secondary | ICD-10-CM | POA: Diagnosis not present

## 2015-06-14 DIAGNOSIS — I10 Essential (primary) hypertension: Secondary | ICD-10-CM | POA: Insufficient documentation

## 2015-06-14 DIAGNOSIS — Z79899 Other long term (current) drug therapy: Secondary | ICD-10-CM | POA: Diagnosis not present

## 2015-06-14 DIAGNOSIS — M109 Gout, unspecified: Secondary | ICD-10-CM | POA: Diagnosis not present

## 2015-06-14 DIAGNOSIS — K5732 Diverticulitis of large intestine without perforation or abscess without bleeding: Secondary | ICD-10-CM | POA: Diagnosis not present

## 2015-06-14 DIAGNOSIS — E780 Pure hypercholesterolemia: Secondary | ICD-10-CM | POA: Insufficient documentation

## 2015-06-14 LAB — COMPREHENSIVE METABOLIC PANEL
ALT: 43 U/L (ref 17–63)
AST: 36 U/L (ref 15–41)
Albumin: 4.2 g/dL (ref 3.5–5.0)
Alkaline Phosphatase: 56 U/L (ref 38–126)
Anion gap: 10 (ref 5–15)
BUN: 7 mg/dL (ref 6–20)
CHLORIDE: 105 mmol/L (ref 101–111)
CO2: 26 mmol/L (ref 22–32)
CREATININE: 1.1 mg/dL (ref 0.61–1.24)
Calcium: 8.8 mg/dL — ABNORMAL LOW (ref 8.9–10.3)
GFR calc Af Amer: >60 mL/min (ref >60)
GFR calc non Af Amer: >60 mL/min (ref >60)
Glucose, Bld: 105 mg/dL — ABNORMAL HIGH (ref 65–99)
POTASSIUM: 3.2 mmol/L — AB (ref 3.5–5.1)
SODIUM: 141 mmol/L (ref 135–145)
Total Bilirubin: 1.1 mg/dL (ref 0.3–1.2)
Total Protein: 7.1 g/dL (ref 6.5–8.1)

## 2015-06-14 LAB — CBC WITH DIFFERENTIAL/PLATELET
BASOS ABS: 0 10*3/uL (ref 0.0–0.1)
Basophils Relative: 0 %
EOS ABS: 0.3 10*3/uL (ref 0.0–0.7)
Eosinophils Relative: 1 %
HCT: 43.5 % (ref 39.0–52.0)
Hemoglobin: 15 g/dL (ref 13.0–17.0)
LYMPHS PCT: 64 %
Lymphs Abs: 16.4 10*3/uL — ABNORMAL HIGH (ref 0.7–4.0)
MCH: 32.3 pg (ref 26.0–34.0)
MCHC: 34.5 g/dL (ref 30.0–36.0)
MCV: 93.5 fL (ref 78.0–100.0)
Monocytes Absolute: 1 10*3/uL (ref 0.1–1.0)
Monocytes Relative: 4 %
Neutro Abs: 8 10*3/uL — ABNORMAL HIGH (ref 1.7–7.7)
Neutrophils Relative %: 31 %
PLATELETS: 146 10*3/uL — AB (ref 150–400)
RBC: 4.65 MIL/uL (ref 4.22–5.81)
RDW: 13.3 % (ref 11.5–15.5)
WBC: 25.7 10*3/uL — AB (ref 4.0–10.5)

## 2015-06-14 LAB — LIPASE, BLOOD: Lipase: 18 U/L — ABNORMAL LOW (ref 22–51)

## 2015-06-14 MED ORDER — IOHEXOL 300 MG/ML  SOLN
100.0000 mL | Freq: Once | INTRAMUSCULAR | Status: AC | PRN
  Administered 2015-06-14: 100 mL via INTRAVENOUS

## 2015-06-14 MED ORDER — SODIUM CHLORIDE 0.9 % IV BOLUS (SEPSIS)
1000.0000 mL | Freq: Once | INTRAVENOUS | Status: AC
Start: 2015-06-14 — End: 2015-06-14
  Administered 2015-06-14: 1000 mL via INTRAVENOUS

## 2015-06-14 MED ORDER — MORPHINE SULFATE (PF) 2 MG/ML IV SOLN
2.0000 mg | Freq: Once | INTRAVENOUS | Status: AC
  Administered 2015-06-14: 2 mg via INTRAVENOUS
  Filled 2015-06-14: qty 1

## 2015-06-14 MED ORDER — ONDANSETRON HCL 4 MG/2ML IJ SOLN
4.0000 mg | Freq: Once | INTRAMUSCULAR | Status: AC
  Administered 2015-06-14: 4 mg via INTRAVENOUS
  Filled 2015-06-14: qty 2

## 2015-06-14 MED ORDER — IOHEXOL 300 MG/ML  SOLN
50.0000 mL | Freq: Once | INTRAMUSCULAR | Status: AC | PRN
  Administered 2015-06-14: 50 mL via ORAL

## 2015-06-14 NOTE — ED Notes (Signed)
Pt c/o lower abdominal pain for the last week.  He states he is having a difficult time having a bowel movement, but had a small one yesterday.  He states he has a hx of diverticulitis and that he had some dark stools about one to two weeks ago.  Pt denies n/v/d.

## 2015-06-14 NOTE — ED Provider Notes (Signed)
CSN: [id number]     Arrival date & time 06/14/15  1924 History  This chart was scribed for April Palumbo, MD by Starleen Arms, ED Scribe. This patient was seen in room MH05/MH05 and the patient's care was started at 11:45 AM.   Chief Complaint  Patient presents with  . Abdominal Pain   Patient is a 74 y.o. male presenting with abdominal pain. The history is provided by the patient. No language interpreter was used.  Abdominal Pain Pain location:  LLQ Pain quality: aching   Pain radiates to:  Does not radiate Pain severity:  Severe Onset quality:  Gradual Timing:  Constant Progression:  Worsening Chronicity:  Recurrent Context: not alcohol use   Relieved by:  Nothing Worsened by:  Nothing tried Ineffective treatments:  None tried Associated symptoms: constipation   Associated symptoms: no vomiting   Risk factors: being elderly    HPI Comments: TIARA BARTOLI is a 74 y.o. male with hx of diverticulities who presents to the Emergency Department complaining of moderate lower abdominal pain onset over a week ago.  Associated symptoms include flatulence, constipation (unrelieved by Dulcolax).  He describes this episode as like his typical diverticulitis.  He denies diarrhea, fever, vomiting, dysuria, hematuria.    Past Medical History  Diagnosis Date  . Diverticulitis   . Gout   . Hypercholesteremia   . Hypertension    Past Surgical History  Procedure Laterality Date  . Appendectomy     History reviewed. No pertinent family history. Social History  Substance Use Topics  . Smoking status: Never Smoker   . Smokeless tobacco: None  . Alcohol Use: Yes     Comment: occasional    Review of Systems  Gastrointestinal: Positive for abdominal pain and constipation. Negative for vomiting.  All other systems reviewed and are negative.     Allergies  Review of patient's allergies indicates no known allergies.  Home Medications   Prior to Admission medications   Medication Sig  Start Date End Date Taking? Authorizing Provider  allopurinol (ZYLOPRIM) 300 MG tablet Take 300 mg by mouth daily.    Historical Provider, MD  amLODipine (NORVASC) 5 MG tablet Take 5 mg by mouth daily.    Historical Provider, MD  ciprofloxacin (CIPRO) 500 MG tablet Take 1 tablet (500 mg total) by mouth every 12 (twelve) hours. 12/10/14   Pattricia Boss, MD  HYDROcodone-acetaminophen (NORCO/VICODIN) 5-325 MG per tablet Take 1 tablet by mouth every 4 (four) hours as needed for pain. 11/13/12   Julianne Rice, MD  loratadine (CLARITIN) 10 MG tablet Take 10 mg by mouth daily.    Historical Provider, MD  metroNIDAZOLE (FLAGYL) 500 MG tablet Take 1 tablet (500 mg total) by mouth 2 (two) times daily. 12/10/14   Pattricia Boss, MD  omeprazole (PRILOSEC) 20 MG capsule Take 20 mg by mouth daily.    Historical Provider, MD  ondansetron (ZOFRAN ODT) 8 MG disintegrating tablet Take 1 tablet (8 mg total) by mouth every 8 (eight) hours as needed for nausea or vomiting. 12/10/14   Pattricia Boss, MD  oxyCODONE-acetaminophen (PERCOCET/ROXICET) 5-325 MG per tablet Take 1 tablet by mouth every 4 (four) hours as needed for severe pain. 12/10/14   Pattricia Boss, MD  pravastatin (PRAVACHOL) 40 MG tablet Take 40 mg by mouth daily.    Historical Provider, MD  Tamsulosin HCl (FLOMAX) 0.4 MG CAPS Take by mouth.    Historical Provider, MD   BP 129/76 mmHg  Pulse 69  Temp(Src) 98 F (36.7  C) (Oral)  Resp 18  Ht 5\' 9"  (1.753 m)  Wt 176 lb (79.833 kg)  BMI 25.98 kg/m2  SpO2 96% Physical Exam  Constitutional: He is oriented to person, place, and time. He appears well-developed and well-nourished. No distress.  HENT:  Head: Normocephalic and atraumatic.  Mouth/Throat: Oropharynx is clear and moist. No oropharyngeal exudate.  Eyes: Conjunctivae and EOM are normal. Pupils are equal, round, and reactive to light.  Neck: Normal range of motion. Neck supple. No tracheal deviation present.  Uvula midline.   Cardiovascular: Normal rate  and regular rhythm.   RRR  Pulmonary/Chest: Effort normal. No respiratory distress. He has no wheezes. He has no rales.  Lungs clear.   Abdominal: Soft. Bowel sounds are normal. He exhibits no distension. There is tenderness. There is no rebound and no guarding.  LLQ tenderness.  Stool palpable thorughout the ascending tranvser and proximal dexcnenign supracpubic tenderness.   Musculoskeletal: Normal range of motion.  Neurological: He is alert and oriented to person, place, and time. He has normal reflexes.  Skin: Skin is warm and dry.  Psychiatric: He has a normal mood and affect. His behavior is normal.  Nursing note and vitals reviewed.   ED Course  Procedures (including critical care time)  DIAGNOSTIC STUDIES: Oxygen Saturation is 97% on RA, normal by my interpretation.    COORDINATION OF CARE:  11:50 AM Discussed treatment plan with patient at bedside.  Patient acknowledges and agrees with plan.    Labs Review Labs Reviewed  CBC WITH DIFFERENTIAL/PLATELET - Abnormal; Notable for the following:    WBC 25.7 (*)    Platelets 146 (*)    Neutro Abs 8.0 (*)    Lymphs Abs 16.4 (*)    All other components within normal limits  COMPREHENSIVE METABOLIC PANEL - Abnormal; Notable for the following:    Potassium 3.2 (*)    Glucose, Bld 105 (*)    Calcium 8.8 (*)    All other components within normal limits  LIPASE, BLOOD - Abnormal; Notable for the following:    Lipase 18 (*)    All other components within normal limits    Imaging Review No results found. I have personally reviewed and evaluated these images and lab results as part of my medical decision-making.   EKG Interpretation None      MDM   Final diagnoses:  None    Results for orders placed or performed during the hospital encounter of 06/14/15  CBC with Differential  Result Value Ref Range   WBC 25.7 (H) 4.0 - 10.5 K/uL   RBC 4.65 4.22 - 5.81 MIL/uL   Hemoglobin 15.0 13.0 - 17.0 g/dL   HCT 43.5 39.0 -  52.0 %   MCV 93.5 78.0 - 100.0 fL   MCH 32.3 26.0 - 34.0 pg   MCHC 34.5 30.0 - 36.0 g/dL   RDW 13.3 11.5 - 15.5 %   Platelets 146 (L) 150 - 400 K/uL   Neutrophils Relative % 31 %   Lymphocytes Relative 64 %   Monocytes Relative 4 %   Eosinophils Relative 1 %   Basophils Relative 0 %   Neutro Abs 8.0 (H) 1.7 - 7.7 K/uL   Lymphs Abs 16.4 (H) 0.7 - 4.0 K/uL   Monocytes Absolute 1.0 0.1 - 1.0 K/uL   Eosinophils Absolute 0.3 0.0 - 0.7 K/uL   Basophils Absolute 0.0 0.0 - 0.1 K/uL   RBC Morphology STOMATOCYTES    WBC Morphology ATYPICAL LYMPHOCYTES   Comprehensive  metabolic panel  Result Value Ref Range   Sodium 141 135 - 145 mmol/L   Potassium 3.2 (L) 3.5 - 5.1 mmol/L   Chloride 105 101 - 111 mmol/L   CO2 26 22 - 32 mmol/L   Glucose, Bld 105 (H) 65 - 99 mg/dL   BUN 7 6 - 20 mg/dL   Creatinine, Ser 1.10 0.61 - 1.24 mg/dL   Calcium 8.8 (L) 8.9 - 10.3 mg/dL   Total Protein 7.1 6.5 - 8.1 g/dL   Albumin 4.2 3.5 - 5.0 g/dL   AST 36 15 - 41 U/L   ALT 43 17 - 63 U/L   Alkaline Phosphatase 56 38 - 126 U/L   Total Bilirubin 1.1 0.3 - 1.2 mg/dL   GFR calc non Af Amer >60 >60 mL/min   GFR calc Af Amer >60 >60 mL/min   Anion gap 10 5 - 15  Lipase, blood  Result Value Ref Range   Lipase 18 (L) 22 - 51 U/L  Urinalysis, Routine w reflex microscopic (not at [facility])  Result Value Ref Range   Color, Urine YELLOW YELLOW   APPearance CLEAR CLEAR   Specific Gravity, Urine 1.016 1.005 - 1.030   pH 6.0 5.0 - 8.0   Glucose, UA NEGATIVE NEGATIVE mg/dL   Hgb urine dipstick NEGATIVE NEGATIVE   Bilirubin Urine NEGATIVE NEGATIVE   Ketones, ur NEGATIVE NEGATIVE mg/dL   Protein, ur NEGATIVE NEGATIVE mg/dL   Urobilinogen, UA 0.2 0.0 - 1.0 mg/dL   Nitrite NEGATIVE NEGATIVE   Leukocytes, UA NEGATIVE NEGATIVE   Ct Abdomen Pelvis W Contrast  06/15/2015   CLINICAL DATA:  Low abd pain, ? diverticulitis, Hx appendectomy, diverticulitis 12mL omni 300^34mL OMNIPAQUE IOHEXOL 300 MG/ML SOLN, 179mL OMNIPAQUE  IOHEXOL 300 MG/ML SOLN  EXAM: CT ABDOMEN AND PELVIS WITH CONTRAST  TECHNIQUE: Multidetector CT imaging of the abdomen and pelvis was performed using the standard protocol following bolus administration of intravenous contrast.  CONTRAST:  16mL OMNIPAQUE IOHEXOL 300 MG/ML SOLN, 181mL OMNIPAQUE IOHEXOL 300 MG/ML SOLN  COMPARISON:  CT of the abdomen and pelvis 12/10/2014  FINDINGS: Lower chest: No pulmonary nodules, pleural effusions, or infiltrates. Coronary artery calcifications are present. No pericardial effusion.  Upper abdomen: No focal abnormality identified within the spleen, pancreas, or adrenal glands. There is a 5 mm nodule within the posterior segment of the right hepatic lobe, likely benign. There are numerous low-attenuation lesions within both kidneys and consistent with cysts. No hydronephrosis. The gallbladder is present.  Gastrointestinal tract: The stomach and small bowel loops are normal in appearance. Previous appendectomy. There are numerous colonic diverticula. There is mild inflammation associated with the sigmoid segment. No associated abscess or free air.  Pelvis: Urinary bladder has a normal appearance. Prostatic calcifications are present. No free pelvic fluid.  Retroperitoneum: Atherosclerotic calcification of the abdominal aorta. No aneurysm. Small, nonspecific mesenteric root lymph nodes are identified, stable in appearance.  Abdominal wall: Unremarkable.  Osseous structures: No suspicious lytic or blastic lesions are identified. Mild degenerative changes.  IMPRESSION: 1. Significant sigmoid diverticulosis. Associated inflammatory change consistent with acute diverticulitis. No associated abscess or perforation. 2. Coronary artery disease. 3. Appendectomy. 4. Probable right hepatic cyst. 5. Numerous renal cysts.   Electronically Signed   By: Nolon Nations M.D.   On: 06/15/2015 00:05    Medications  ondansetron (ZOFRAN) injection 4 mg (4 mg Intravenous Given 06/14/15 2213)  morphine  2 MG/ML injection 2 mg (2 mg Intravenous Given 06/14/15 2213)  sodium chloride 0.9 %  bolus 1,000 mL (0 mLs Intravenous Stopped 06/14/15 2302)  iohexol (OMNIPAQUE) 300 MG/ML solution 50 mL (50 mLs Oral Contrast Given 06/14/15 2330)  iohexol (OMNIPAQUE) 300 MG/ML solution 100 mL (100 mLs Intravenous Contrast Given 06/14/15 2331)  sodium chloride 0.9 % bolus 500 mL (0 mLs Intravenous Stopped 06/15/15 0211)  ciprofloxacin (CIPRO) IVPB 400 mg (0 mg Intravenous Stopped 06/15/15 0212)  metroNIDAZOLE (FLAGYL) IVPB 500 mg (0 mg Intravenous Stopped 06/15/15 0212)  ketorolac (TORADOL) 30 MG/ML injection 30 mg (30 mg Intravenous Given 06/15/15 0106)   Follow up with your family doctor for recheck Monday.  Return for fevers,if your abdomen becomes stiff and hard, you can't pass gas, or for  intractable pain or vomiting weakness or any concerns.  Patient verbalizes understanding and agrees to follow up  An After Visit Summary was printed and given to the patient.   I personally performed the services described in this documentation, which was scribed in my presence. The recorded information has been reviewed and is accurate.      Veatrice Kells, MD 06/15/15 [id number]

## 2015-06-14 NOTE — ED Notes (Signed)
Pt states he has been having abd pain for 1 week.  Pt states he took a stool softener last night.

## 2015-06-15 ENCOUNTER — Encounter (HOSPITAL_BASED_OUTPATIENT_CLINIC_OR_DEPARTMENT_OTHER): Payer: Self-pay | Admitting: Emergency Medicine

## 2015-06-15 LAB — URINALYSIS, ROUTINE W REFLEX MICROSCOPIC
Bilirubin Urine: NEGATIVE
GLUCOSE, UA: NEGATIVE mg/dL
Hgb urine dipstick: NEGATIVE
KETONES UR: NEGATIVE mg/dL
LEUKOCYTES UA: NEGATIVE
Nitrite: NEGATIVE
PH: 6 (ref 5.0–8.0)
Protein, ur: NEGATIVE mg/dL
SPECIFIC GRAVITY, URINE: 1.016 (ref 1.005–1.030)
Urobilinogen, UA: 0.2 mg/dL (ref 0.0–1.0)

## 2015-06-15 MED ORDER — CIPROFLOXACIN HCL 500 MG PO TABS: 500.0000 mg | ORAL_TABLET | Freq: Two times a day (BID) | ORAL | Status: DC

## 2015-06-15 MED ORDER — METRONIDAZOLE 500 MG PO TABS: 500.0000 mg | ORAL_TABLET | Freq: Three times a day (TID) | ORAL | Status: DC

## 2015-06-15 MED ORDER — CIPROFLOXACIN IN D5W 400 MG/200ML IV SOLN
400.0000 mg | Freq: Once | INTRAVENOUS | Status: AC
  Administered 2015-06-15: 400 mg via INTRAVENOUS
  Filled 2015-06-15: qty 200

## 2015-06-15 MED ORDER — TRAMADOL HCL 50 MG PO TABS: 50.0000 mg | ORAL_TABLET | Freq: Four times a day (QID) | ORAL | Status: DC | PRN

## 2015-06-15 MED ORDER — SODIUM CHLORIDE 0.9 % IV BOLUS (SEPSIS)
500.0000 mL | Freq: Once | INTRAVENOUS | Status: AC
  Administered 2015-06-15: 500 mL via INTRAVENOUS

## 2015-06-15 MED ORDER — METRONIDAZOLE IN NACL 5-0.79 MG/ML-% IV SOLN
500.0000 mg | Freq: Once | INTRAVENOUS | Status: AC
  Administered 2015-06-15: 500 mg via INTRAVENOUS
  Filled 2015-06-15: qty 100

## 2015-06-15 MED ORDER — KETOROLAC TROMETHAMINE 30 MG/ML IJ SOLN
30.0000 mg | Freq: Once | INTRAMUSCULAR | Status: AC
  Administered 2015-06-15: 30 mg via INTRAVENOUS
  Filled 2015-06-15: qty 1

## 2015-06-15 NOTE — ED Notes (Signed)
Pt verbalizes understanding of d/c instructions and denies any further needs at this time. 

## 2015-06-15 NOTE — Discharge Instructions (Signed)
Diverticulitis °Diverticulitis is when small pockets that have formed in your colon (large intestine) become infected or swollen. °HOME CARE °· Follow your doctor's instructions. °· Follow a special diet if told by your doctor. °· When you feel better, your doctor may tell you to change your diet. You may be told to eat a lot of fiber. Fruits and vegetables are good sources of fiber. Fiber makes it easier to poop (have bowel movements). °· Take supplements or probiotics as told by your doctor. °· Only take medicines as told by your doctor. °· Keep all follow-up visits with your doctor. °GET HELP IF: °· Your pain does not get better. °· You have a hard time eating food. °· You are not pooping like normal. °GET HELP RIGHT AWAY IF: °· Your pain gets worse. °· Your problems do not get better. °· Your problems suddenly get worse. °· You have a fever. °· You keep throwing up (vomiting). °· You have bloody or black, tarry poop (stool). °MAKE SURE YOU:  °· Understand these instructions. °· Will watch your condition. °· Will get help right away if you are not doing well or get worse. °Document Released: 02/27/2008 Document Revised: 09/15/2013 Document Reviewed: 08/05/2013 °[employer]® Patient Information ©2015 [employer]. This information is not intended to replace advice given to you by your health care provider. Make sure you discuss any questions you have with your health care provider. ° °

## 2015-06-20 ENCOUNTER — Encounter (HOSPITAL_BASED_OUTPATIENT_CLINIC_OR_DEPARTMENT_OTHER): Payer: Self-pay | Admitting: Emergency Medicine

## 2015-06-20 ENCOUNTER — Other Ambulatory Visit: Payer: Medicare Other

## 2015-06-20 ENCOUNTER — Emergency Department (HOSPITAL_BASED_OUTPATIENT_CLINIC_OR_DEPARTMENT_OTHER): Payer: Medicare Other

## 2015-06-20 ENCOUNTER — Inpatient Hospital Stay (HOSPITAL_BASED_OUTPATIENT_CLINIC_OR_DEPARTMENT_OTHER)
Admission: EM | Admit: 2015-06-20 | Discharge: 2015-06-22 | DRG: 872 | Disposition: A | Discharge status: Home / Self Care | Payer: Medicare Other | Attending: Internal Medicine | Admitting: Internal Medicine

## 2015-06-20 ENCOUNTER — Inpatient Hospital Stay (HOSPITAL_COMMUNITY): Payer: Medicare Other

## 2015-06-20 DIAGNOSIS — K5732 Diverticulitis of large intestine without perforation or abscess without bleeding: Secondary | ICD-10-CM | POA: Diagnosis present

## 2015-06-20 DIAGNOSIS — I10 Essential (primary) hypertension: Secondary | ICD-10-CM | POA: Diagnosis present

## 2015-06-20 DIAGNOSIS — E876 Hypokalemia: Secondary | ICD-10-CM | POA: Diagnosis present

## 2015-06-20 DIAGNOSIS — N179 Acute kidney failure, unspecified: Secondary | ICD-10-CM | POA: Diagnosis present

## 2015-06-20 DIAGNOSIS — M109 Gout, unspecified: Secondary | ICD-10-CM | POA: Diagnosis present

## 2015-06-20 DIAGNOSIS — Z79899 Other long term (current) drug therapy: Secondary | ICD-10-CM

## 2015-06-20 DIAGNOSIS — E860 Dehydration: Secondary | ICD-10-CM | POA: Diagnosis present

## 2015-06-20 DIAGNOSIS — R1013 Epigastric pain: Secondary | ICD-10-CM | POA: Diagnosis present

## 2015-06-20 DIAGNOSIS — A419 Sepsis, unspecified organism: Principal | ICD-10-CM | POA: Diagnosis present

## 2015-06-20 DIAGNOSIS — Z8249 Family history of ischemic heart disease and other diseases of the circulatory system: Secondary | ICD-10-CM

## 2015-06-20 DIAGNOSIS — K5792 Diverticulitis of intestine, part unspecified, without perforation or abscess without bleeding: Secondary | ICD-10-CM | POA: Diagnosis present

## 2015-06-20 DIAGNOSIS — K5712 Diverticulitis of small intestine without perforation or abscess without bleeding: Secondary | ICD-10-CM | POA: Diagnosis not present

## 2015-06-20 DIAGNOSIS — E780 Pure hypercholesterolemia: Secondary | ICD-10-CM | POA: Diagnosis present

## 2015-06-20 DIAGNOSIS — Z79891 Long term (current) use of opiate analgesic: Secondary | ICD-10-CM | POA: Diagnosis not present

## 2015-06-20 DIAGNOSIS — K5733 Diverticulitis of large intestine without perforation or abscess with bleeding: Secondary | ICD-10-CM

## 2015-06-20 DIAGNOSIS — K5752 Diverticulitis of both small and large intestine without perforation or abscess without bleeding: Secondary | ICD-10-CM

## 2015-06-20 DIAGNOSIS — Z789 Other specified health status: Secondary | ICD-10-CM | POA: Insufficient documentation

## 2015-06-20 DIAGNOSIS — R1032 Left lower quadrant pain: Secondary | ICD-10-CM | POA: Diagnosis present

## 2015-06-20 DIAGNOSIS — Z9049 Acquired absence of other specified parts of digestive tract: Secondary | ICD-10-CM | POA: Diagnosis present

## 2015-06-20 LAB — CBC WITH DIFFERENTIAL/PLATELET
BASOS ABS: 0 10*3/uL (ref 0.0–0.1)
BASOS PCT: 0 %
Basophils Absolute: 0 10*3/uL (ref 0.0–0.1)
Basophils Relative: 0 %
EOS ABS: 0 10*3/uL (ref 0.0–0.7)
Eosinophils Absolute: 0 10*3/uL (ref 0.0–0.7)
Eosinophils Relative: 0 %
Eosinophils Relative: 0 %
HCT: 44.7 % (ref 39.0–52.0)
HEMATOCRIT: 43.6 % (ref 39.0–52.0)
Hemoglobin: 15.5 g/dL (ref 13.0–17.0)
Hemoglobin: 15 g/dL (ref 13.0–17.0)
LYMPHS ABS: 13.9 10*3/uL — AB (ref 0.7–4.0)
LYMPHS PCT: 67 %
Lymphocytes Relative: 60 %
Lymphs Abs: 13.1 10*3/uL — ABNORMAL HIGH (ref 0.7–4.0)
MCH: 32.3 pg (ref 26.0–34.0)
MCH: 32.4 pg (ref 26.0–34.0)
MCHC: 34.7 g/dL (ref 30.0–36.0)
MCHC: 34.4 g/dL (ref 30.0–36.0)
MCV: 93.1 fL (ref 78.0–100.0)
MCV: 94.2 fL (ref 78.0–100.0)
MONO ABS: 1.1 10*3/uL — AB (ref 0.1–1.0)
MONOS PCT: 4 %
Monocytes Absolute: 0.8 10*3/uL (ref 0.1–1.0)
Monocytes Relative: 5 %
NEUTROS PCT: 35 %
NEUTROS PCT: 29 %
Neutro Abs: 7.6 10*3/uL (ref 1.7–7.7)
Neutro Abs: 6 10*3/uL (ref 1.7–7.7)
PLATELETS: 153 10*3/uL (ref 150–400)
Platelets: 147 10*3/uL — ABNORMAL LOW (ref 150–400)
RBC: 4.8 MIL/uL (ref 4.22–5.81)
RBC: 4.63 MIL/uL (ref 4.22–5.81)
RDW: 13.2 % (ref 11.5–15.5)
RDW: 13.5 % (ref 11.5–15.5)
WBC: 21.8 10*3/uL — ABNORMAL HIGH (ref 4.0–10.5)
WBC: 20.7 10*3/uL — AB (ref 4.0–10.5)

## 2015-06-20 LAB — PROCALCITONIN

## 2015-06-20 LAB — LIPASE, BLOOD: LIPASE: 20 U/L — AB (ref 22–51)

## 2015-06-20 LAB — COMPREHENSIVE METABOLIC PANEL
ALT: 58 U/L (ref 17–63)
ANION GAP: 10 (ref 5–15)
AST: 44 U/L — ABNORMAL HIGH (ref 15–41)
Albumin: 4 g/dL (ref 3.5–5.0)
Alkaline Phosphatase: 42 U/L (ref 38–126)
BUN: 15 mg/dL (ref 6–20)
CHLORIDE: 105 mmol/L (ref 101–111)
CO2: 25 mmol/L (ref 22–32)
Calcium: 8.9 mg/dL (ref 8.9–10.3)
Creatinine, Ser: 1.41 mg/dL — ABNORMAL HIGH (ref 0.61–1.24)
GFR calc non Af Amer: 48 mL/min — ABNORMAL LOW (ref >60)
GFR, EST AFRICAN AMERICAN: 55 mL/min — AB (ref >60)
Glucose, Bld: 115 mg/dL — ABNORMAL HIGH (ref 65–99)
Potassium: 3.1 mmol/L — ABNORMAL LOW (ref 3.5–5.1)
SODIUM: 140 mmol/L (ref 135–145)
Total Bilirubin: 1.1 mg/dL (ref 0.3–1.2)
Total Protein: 6.7 g/dL (ref 6.5–8.1)

## 2015-06-20 LAB — LACTIC ACID, PLASMA
LACTIC ACID, VENOUS: 1.1 mmol/L (ref 0.5–2.0)
Lactic Acid, Venous: 1.1 mmol/L (ref 0.5–2.0)

## 2015-06-20 LAB — I-STAT CG4 LACTIC ACID, ED: Lactic Acid, Venous: 1.7 mmol/L (ref 0.5–2.0)

## 2015-06-20 LAB — CREATININE, SERUM
Creatinine, Ser: 1.01 mg/dL (ref 0.61–1.24)
GFR calc Af Amer: >60 mL/min (ref >60)
GFR calc non Af Amer: >60 mL/min (ref >60)

## 2015-06-20 LAB — PROTIME-INR
INR: 1.26 (ref 0.00–1.49)
Prothrombin Time: 16 seconds — ABNORMAL HIGH (ref 11.6–15.2)

## 2015-06-20 LAB — APTT: APTT: 32 s (ref 24–37)

## 2015-06-20 MED ORDER — TAMSULOSIN HCL 0.4 MG PO CAPS
0.4000 mg | ORAL_CAPSULE | Freq: Every day | ORAL | Status: DC
  Administered 2015-06-20 – 2015-06-21 (×2): 0.4 mg via ORAL
  Filled 2015-06-20 (×2): qty 1

## 2015-06-20 MED ORDER — ONDANSETRON HCL 4 MG/2ML IJ SOLN
4.0000 mg | Freq: Four times a day (QID) | INTRAMUSCULAR | Status: DC | PRN
Start: 2015-06-20 — End: 2015-06-20

## 2015-06-20 MED ORDER — HYDROMORPHONE HCL 1 MG/ML IJ SOLN
1.0000 mg | INTRAMUSCULAR | Status: DC | PRN
  Administered 2015-06-20 – 2015-06-21 (×2): 1 mg via INTRAVENOUS
  Filled 2015-06-20 (×2): qty 1

## 2015-06-20 MED ORDER — AMLODIPINE BESYLATE 5 MG PO TABS
5.0000 mg | ORAL_TABLET | Freq: Every day | ORAL | Status: DC
  Administered 2015-06-20 – 2015-06-22 (×3): 5 mg via ORAL
  Filled 2015-06-20 (×3): qty 1

## 2015-06-20 MED ORDER — SODIUM CHLORIDE 0.9 % IV BOLUS (SEPSIS)
1000.0000 mL | Freq: Once | INTRAVENOUS | Status: AC
  Administered 2015-06-20: 1000 mL via INTRAVENOUS

## 2015-06-20 MED ORDER — IOHEXOL 300 MG/ML  SOLN
50.0000 mL | Freq: Once | INTRAMUSCULAR | Status: AC | PRN
  Administered 2015-06-20: 50 mL via ORAL

## 2015-06-20 MED ORDER — ONDANSETRON HCL 4 MG/2ML IJ SOLN
INTRAMUSCULAR | Status: AC
  Filled 2015-06-20: qty 2

## 2015-06-20 MED ORDER — ALLOPURINOL 300 MG PO TABS
300.0000 mg | ORAL_TABLET | Freq: Every day | ORAL | Status: DC
  Administered 2015-06-20 – 2015-06-22 (×3): 300 mg via ORAL
  Filled 2015-06-20 (×3): qty 1

## 2015-06-20 MED ORDER — MORPHINE SULFATE (PF) 2 MG/ML IV SOLN
2.0000 mg | INTRAVENOUS | Status: DC | PRN
  Administered 2015-06-20: 2 mg via INTRAVENOUS
  Filled 2015-06-20: qty 1

## 2015-06-20 MED ORDER — METRONIDAZOLE IN NACL 5-0.79 MG/ML-% IV SOLN
500.0000 mg | Freq: Once | INTRAVENOUS | Status: AC
  Administered 2015-06-20: 500 mg via INTRAVENOUS
  Filled 2015-06-20: qty 100

## 2015-06-20 MED ORDER — LORATADINE 10 MG PO TABS
10.0000 mg | ORAL_TABLET | Freq: Every day | ORAL | Status: DC
  Administered 2015-06-21 – 2015-06-22 (×2): 10 mg via ORAL
  Filled 2015-06-20 (×2): qty 1

## 2015-06-20 MED ORDER — METRONIDAZOLE IN NACL 5-0.79 MG/ML-% IV SOLN
500.0000 mg | Freq: Three times a day (TID) | INTRAVENOUS | Status: DC
  Administered 2015-06-20 – 2015-06-21 (×2): 500 mg via INTRAVENOUS
  Filled 2015-06-20 (×2): qty 100

## 2015-06-20 MED ORDER — ONDANSETRON HCL 4 MG/2ML IJ SOLN
4.0000 mg | Freq: Four times a day (QID) | INTRAMUSCULAR | Status: DC | PRN
  Administered 2015-06-21: 4 mg via INTRAVENOUS
  Filled 2015-06-20: qty 2

## 2015-06-20 MED ORDER — SODIUM CHLORIDE 0.9 % IV BOLUS (SEPSIS): 1000.0000 mL | Freq: Once | INTRAVENOUS | Status: DC

## 2015-06-20 MED ORDER — ONDANSETRON HCL 4 MG/2ML IJ SOLN
4.0000 mg | Freq: Once | INTRAMUSCULAR | Status: AC
  Administered 2015-06-20: 4 mg via INTRAVENOUS

## 2015-06-20 MED ORDER — IOHEXOL 300 MG/ML  SOLN
100.0000 mL | Freq: Once | INTRAMUSCULAR | Status: AC | PRN
  Administered 2015-06-20: 100 mL via INTRAVENOUS

## 2015-06-20 MED ORDER — POTASSIUM CHLORIDE CRYS ER 20 MEQ PO TBCR
40.0000 meq | EXTENDED_RELEASE_TABLET | Freq: Once | ORAL | Status: AC
  Administered 2015-06-20: 40 meq via ORAL
  Filled 2015-06-20: qty 2

## 2015-06-20 MED ORDER — ONDANSETRON HCL 4 MG PO TABS
4.0000 mg | ORAL_TABLET | Freq: Four times a day (QID) | ORAL | Status: DC | PRN
Start: 2015-06-20 — End: 2015-06-22

## 2015-06-20 MED ORDER — CIPROFLOXACIN IN D5W 400 MG/200ML IV SOLN
400.0000 mg | Freq: Once | INTRAVENOUS | Status: AC
  Administered 2015-06-20: 400 mg via INTRAVENOUS
  Filled 2015-06-20: qty 200

## 2015-06-20 MED ORDER — PRAVASTATIN SODIUM 20 MG PO TABS
40.0000 mg | ORAL_TABLET | Freq: Every day | ORAL | Status: DC
  Administered 2015-06-20: 20 mg via ORAL
  Administered 2015-06-21 – 2015-06-22 (×2): 40 mg via ORAL
  Filled 2015-06-20 (×3): qty 2

## 2015-06-20 MED ORDER — SODIUM CHLORIDE 0.9 % IV SOLN
INTRAVENOUS | Status: DC
  Administered 2015-06-20 – 2015-06-21 (×2): via INTRAVENOUS
  Administered 2015-06-22: 1000 mL via INTRAVENOUS

## 2015-06-20 MED ORDER — ENOXAPARIN SODIUM 40 MG/0.4ML ~~LOC~~ SOLN
40.0000 mg | SUBCUTANEOUS | Status: DC
  Administered 2015-06-20: 40 mg via SUBCUTANEOUS
  Filled 2015-06-20: qty 0.4

## 2015-06-20 MED ORDER — CIPROFLOXACIN IN D5W 400 MG/200ML IV SOLN
400.0000 mg | Freq: Two times a day (BID) | INTRAVENOUS | Status: DC
  Administered 2015-06-20 – 2015-06-21 (×2): 400 mg via INTRAVENOUS
  Filled 2015-06-20 (×2): qty 200

## 2015-06-20 MED ORDER — PANTOPRAZOLE SODIUM 40 MG PO TBEC: 80.0000 mg | DELAYED_RELEASE_TABLET | Freq: Every day | ORAL | Status: DC

## 2015-06-20 NOTE — ED Notes (Signed)
[facility] has been notified of room 1325 at [facility].  Truck is headed this way

## 2015-06-20 NOTE — ED Provider Notes (Signed)
CSN: [id number]     Arrival date & time 06/20/15  0820 History   First MD Initiated Contact with Patient 06/20/15 [phone]     Chief Complaint  Patient presents with  . Abdominal Pain     (Consider location/radiation/quality/duration/timing/severity/associated sxs/prior Treatment) Patient is a 74 y.o. male presenting with abdominal pain.  Abdominal Pain Pain location:  LLQ Pain quality: aching   Pain radiates to:  Back and groin Pain severity:  Moderate Onset quality:  Gradual Duration:  8 days Progression:  Worsening Chronicity:  Recurrent Context: previous surgery (appy remote) and retching   Context: not alcohol use   Relieved by:  Nothing Worsened by:  Nothing tried Ineffective treatments: antibiotics. Associated symptoms: diarrhea (x1 loose stool), fever (subjective) and vomiting (x3 once this morning)   Risk factors: being elderly   Risk factors comment:  Day 6 of cipro/flagyl therapy for diverticulitis   Past Medical History  Diagnosis Date  . Diverticulitis   . Gout   . Hypercholesteremia   . Hypertension    Past Surgical History  Procedure Laterality Date  . Appendectomy     No family history on file. Social History  Substance Use Topics  . Smoking status: Never Smoker   . Smokeless tobacco: None  . Alcohol Use: Yes     Comment: occasional    Review of Systems  Constitutional: Positive for fever (subjective).  Gastrointestinal: Positive for vomiting (x3 once this morning), abdominal pain and diarrhea (x1 loose stool).  All other systems reviewed and are negative.     Allergies  Review of patient's allergies indicates no known allergies.  Home Medications   Prior to Admission medications   Medication Sig Start Date End Date Taking? Authorizing Provider  allopurinol (ZYLOPRIM) 300 MG tablet Take 300 mg by mouth daily.   Yes Historical Provider, MD  amLODipine (NORVASC) 5 MG tablet Take 5 mg by mouth daily.   Yes Historical Provider, MD   ciprofloxacin (CIPRO) 500 MG tablet Take 1 tablet (500 mg total) by mouth 2 (two) times daily. One po bid x 7 days 06/15/15  Yes April Palumbo, MD  loratadine (CLARITIN) 10 MG tablet Take 10 mg by mouth daily.   Yes Historical Provider, MD  metroNIDAZOLE (FLAGYL) 500 MG tablet Take 1 tablet (500 mg total) by mouth 3 (three) times daily. 06/15/15  Yes April Palumbo, MD  omeprazole (PRILOSEC) 40 MG capsule Take 40 mg by mouth daily.   Yes Historical Provider, MD  pravastatin (PRAVACHOL) 40 MG tablet Take 40 mg by mouth daily.   Yes Historical Provider, MD  Tamsulosin HCl (FLOMAX) 0.4 MG CAPS Take 0.4 mg by mouth daily after supper.    Yes Historical Provider, MD  ciprofloxacin (CIPRO) 500 MG tablet Take 1 tablet (500 mg total) by mouth every 12 (twelve) hours. Patient not taking: Reported on 06/20/2015 12/10/14   Pattricia Boss, MD  HYDROcodone-acetaminophen (NORCO/VICODIN) 5-325 MG per tablet Take 1 tablet by mouth every 4 (four) hours as needed for pain. Patient not taking: Reported on 06/20/2015 11/13/12   Julianne Rice, MD  metroNIDAZOLE (FLAGYL) 500 MG tablet Take 1 tablet (500 mg total) by mouth 2 (two) times daily. Patient not taking: Reported on 06/20/2015 12/10/14   Pattricia Boss, MD  ondansetron (ZOFRAN ODT) 8 MG disintegrating tablet Take 1 tablet (8 mg total) by mouth every 8 (eight) hours as needed for nausea or vomiting. Patient not taking: Reported on 06/20/2015 12/10/14   Pattricia Boss, MD  oxyCODONE-acetaminophen (PERCOCET/ROXICET) 5-325 MG per  tablet Take 1 tablet by mouth every 4 (four) hours as needed for severe pain. Patient not taking: Reported on 06/20/2015 12/10/14   Pattricia Boss, MD  traMADol (ULTRAM) 50 MG tablet Take 1 tablet (50 mg total) by mouth every 6 (six) hours as needed for severe pain. Patient not taking: Reported on 06/20/2015 06/15/15   April Palumbo, MD   BP 132/76 mmHg  Pulse 70  Temp(Src) 97.9 F (36.6 C) (Oral)  Resp 18  Ht 5\' 9"  (1.753 m)  Wt 175 lb (79.379 kg)   BMI 25.83 kg/m2  SpO2 98% Physical Exam  Constitutional: He is oriented to person, place, and time. He appears well-developed and well-nourished. No distress.  HENT:  Head: Normocephalic and atraumatic.  Eyes: Conjunctivae are normal.  Neck: Neck supple. No tracheal deviation present.  Cardiovascular: Normal rate and regular rhythm.   Pulmonary/Chest: Effort normal. No respiratory distress.  Abdominal: Soft. He exhibits no distension. There is tenderness in the left lower quadrant.  Neurological: He is alert and oriented to person, place, and time.  Skin: Skin is warm and dry.  Psychiatric: He has a normal mood and affect.    ED Course  Procedures (including critical care time) Labs Review Labs Reviewed  CBC WITH DIFFERENTIAL/PLATELET - Abnormal; Notable for the following:    WBC 21.8 (*)    Lymphs Abs 13.1 (*)    Monocytes Absolute 1.1 (*)    All other components within normal limits  COMPREHENSIVE METABOLIC PANEL - Abnormal; Notable for the following:    Potassium 3.1 (*)    Glucose, Bld 115 (*)    Creatinine, Ser 1.41 (*)    AST 44 (*)    GFR calc non Af Amer 48 (*)    GFR calc Af Amer 55 (*)    All other components within normal limits  LIPASE, BLOOD - Abnormal; Notable for the following:    Lipase 20 (*)    All other components within normal limits  CBC WITH DIFFERENTIAL/PLATELET - Abnormal; Notable for the following:    WBC 20.7 (*)    Platelets 147 (*)    Lymphs Abs 13.9 (*)    All other components within normal limits  PROTIME-INR - Abnormal; Notable for the following:    Prothrombin Time 16.0 (*)    All other components within normal limits  CULTURE, BLOOD (ROUTINE X 2)  CULTURE, BLOOD (ROUTINE X 2)  C DIFFICILE QUICK SCREEN W PCR REFLEX  CREATININE, SERUM  LACTIC ACID, PLASMA  PROCALCITONIN  APTT  CBC  LACTIC ACID, PLASMA  BASIC METABOLIC PANEL  CBC  GI PATHOGEN PANEL BY PCR, STOOL  PATHOLOGIST SMEAR REVIEW  I-STAT CG4 LACTIC ACID, ED    Imaging  Review Ct Abdomen Pelvis W Contrast  06/20/2015   CLINICAL DATA:  Abdominal pain, diarrhea  EXAM: CT ABDOMEN AND PELVIS WITH CONTRAST  TECHNIQUE: Multidetector CT imaging of the abdomen and pelvis was performed using the standard protocol following bolus administration of intravenous contrast.  CONTRAST:  125mL OMNIPAQUE IOHEXOL 300 MG/ML SOLN, 16mL OMNIPAQUE IOHEXOL 300 MG/ML SOLN  COMPARISON:  06/14/2015  FINDINGS: Lower chest: Lung bases are clear. Normal heart size. Coronary artery atherosclerosis in the RCA.  Hepatobiliary: Diffuse low attenuation of the liver consistent with hepatic steatosis. No intrahepatic or extrahepatic biliary ductal dilatation. No focal mass. Normal gallbladder.  Pancreas: Normal.  Spleen: Normal.  Adrenals/Urinary Tract: Normal adrenal glands. No urolithiasis or obstructive uropathy. 10 mm hypodense, fluid attenuating posterior right interpolar renal mass most  consistent with a cyst. 2 cm hypodense, fluid attenuating left anterior interpolar renal mass most consistent with a cyst. 15 mm hypodense, fluid attenuating left posterior interpolar renal mass most consistent with a cyst. 210 mm inferior pole left renal masses most consistent with cysts. Normal bladder.  Stomach/Bowel: No bowel dilatation to suggest obstruction. Severe sigmoid diverticulosis with mild bowel wall thickening and inflammatory changes. No pneumatosis, pneumoperitoneum or portal venous gas. No peridiverticular fluid collection. No abdominal or pelvic free fluid.  Vascular/Lymphatic: Normal caliber abdominal aorta with mild atherosclerosis. No abdominal or pelvic lymphadenopathy.  Other: No fluid collection or hematoma.  Musculoskeletal: No acute osseous abnormality. No lytic or sclerotic osseous lesion.  IMPRESSION: 1. Mild acute diverticulitis of the sigmoid colon. No focal fluid collection to suggest an abscess. No significant interval change compared with 06/14/2015. 2. Hepatic steatosis. 3. Coronary artery  atherosclerosis. 4. Bilateral renal cysts.   Electronically Signed   By: Kathreen Devoid   On: 06/20/2015 10:56   Dg Chest Port 1 View  06/20/2015   CLINICAL DATA:  Nausea vomiting for 3 days  EXAM: PORTABLE CHEST 1 VIEW  COMPARISON:  Jan 31, 2007  FINDINGS: The heart size and mediastinal contours are within normal limits. Both lungs are clear. The visualized skeletal structures are unremarkable.  IMPRESSION: No active cardiopulmonary disease.   Electronically Signed   By: Abelardo Diesel M.D.   On: 06/20/2015 17:28   I have personally reviewed and evaluated these images and lab results as part of my medical decision-making.   EKG Interpretation None      MDM   Final diagnoses:  Diverticulitis of large intestine without perforation or abscess with bleeding  Failure of outpatient treatment    74 y.o. male presents with failure of outpatient oral antibiotic therapy for acute diverticulitis with emesis this morning and continued pain on day 6 of therapy. Has persistent elevated WBC, no evidence of complication or surgical indication currently. Otherwise afebrile and stable vital signs. Will admit for IV ABx. Hospitalist was consulted for admission and accepted the patient in transfer.     Leo Grosser, MD 06/20/15 2034

## 2015-06-20 NOTE — ED Notes (Signed)
Pt seen last wed for diverticulitis.  Pt started having N/V since Saturday and has not been able to keep meds down.  Diarrhea yesterday.  Chills yesterday.

## 2015-06-20 NOTE — ED Notes (Signed)
[facility] at bedside for transport. 

## 2015-06-20 NOTE — H&P (Signed)
[facility] History and Physical  Corey [name] LNL:[id number] DOB: 1941/01/30 DOA: 06/20/2015  Referring physician: ED physician, Dr. Randal Buba PCP: Stephens Shire, MD   Chief Complaint: abd pain   HPI:  Patient is 74 year old male who presented to [facility] emergency department for evaluation of lower left quadrant abdominal pain and was transferred to [facility] for further evaluation. Patient reports pain started 7 days ago, mostly in the lower abdominal quadrants, intermittent initially and throbbing, currently constant and sharp, 10/10 in severity, worse in the left lower quadrant and occasionally but not consistently radiating to epigastric area. Patient reports this has been associated with nausea, multiple episodes of nonbloody vomiting, 2 episodes of watery diarrhea this morning, poor oral intake and malaise. Pt reports he was prescribed Cipro and flagyl but was unable to tolerate PO medications due to vomiting. Patient reports similar events in the past and this episode appears to be related to same thing. Patient reports having colonoscopy done in the past, follows with Dr. Collene Mares. Pt is not sure what the results of previous colonoscopies were but thinks it was normal.   In emergency department, patient noted to be hemodynamically stable, vital signs stable, blood work notable for WBC 21, K 3.1, Cr 1.41. Imaging studies notable for mild acute diverticulitis of the sigmoid colon. Patient started on Cipro and Flagyl and [facility] admitted to medical unit.  Assessment and Plan:  Principal Problem:   Abdominal pain, LLQ - secondary to acute diverticulitis  - treat with IV Cipro and Flagyl - advance diet as pt able to tolerate  - consider GI consult in AM if pt not improving   Active Problems:   Diverticulitis - management with Cipro and Flagyl as noted above    Nausea and non bloody vomiting with diarrhea - provide antiemetics as needed, IVF - stool studies  requested    Sepsis - pt meets sepsis criteria with HR up to 102, WBC 21.8 K, source diverticulitis - ABX as noted above - check lactic acid, blood culture, procalcitonin level - IVF ordered     Acute renal injury - pre renal in etiology and secondary to acute problem - IVF as noted above and repeat BMP in AM    Hypokalemia - secondary to diarrhea and vomiting - supplement with K-Dur and repeat BMP in AM    Benign essential HTN - continue Norvasc as per home medical regimen     DVT prophylaxis - Lovenox SQ  Radiological Exams on Admission: Ct Abdomen Pelvis W Contrast  06/20/2015   CLINICAL DATA:  Abdominal pain, diarrhea  EXAM: CT ABDOMEN AND PELVIS WITH CONTRAST  TECHNIQUE: Multidetector CT imaging of the abdomen and pelvis was performed using the standard protocol following bolus administration of intravenous contrast.  CONTRAST:  17mL OMNIPAQUE IOHEXOL 300 MG/ML SOLN, 68mL OMNIPAQUE IOHEXOL 300 MG/ML SOLN  COMPARISON:  06/14/2015  FINDINGS: Lower chest: Lung bases are clear. Normal heart size. Coronary artery atherosclerosis in the RCA.  Hepatobiliary: Diffuse low attenuation of the liver consistent with hepatic steatosis. No intrahepatic or extrahepatic biliary ductal dilatation. No focal mass. Normal gallbladder.  Pancreas: Normal.  Spleen: Normal.  Adrenals/Urinary Tract: Normal adrenal glands. No urolithiasis or obstructive uropathy. 10 mm hypodense, fluid attenuating posterior right interpolar renal mass most consistent with a cyst. 2 cm hypodense, fluid attenuating left anterior interpolar renal mass most consistent with a cyst. 15 mm hypodense, fluid attenuating left posterior interpolar renal mass most consistent with a cyst. 210 mm inferior pole  left renal masses most consistent with cysts. Normal bladder.  Stomach/Bowel: No bowel dilatation to suggest obstruction. Severe sigmoid diverticulosis with mild bowel wall thickening and inflammatory changes. No pneumatosis,  pneumoperitoneum or portal venous gas. No peridiverticular fluid collection. No abdominal or pelvic free fluid.  Vascular/Lymphatic: Normal caliber abdominal aorta with mild atherosclerosis. No abdominal or pelvic lymphadenopathy.  Other: No fluid collection or hematoma.  Musculoskeletal: No acute osseous abnormality. No lytic or sclerotic osseous lesion.  IMPRESSION: 1. Mild acute diverticulitis of the sigmoid colon. No focal fluid collection to suggest an abscess. No significant interval change compared with 06/14/2015. 2. Hepatic steatosis. 3. Coronary artery atherosclerosis. 4. Bilateral renal cysts.   Electronically Signed   By: Kathreen Devoid   On: 06/20/2015 10:56     Code Status: Full Family Communication: Pt at bedside Disposition Plan: Admit for further evaluation    Mart Piggs [facility] [phone]   Review of Systems:  Constitutional: Negative for fever, chills. Negative for diaphoresis.  HENT: Negative for hearing loss, ear pain, nosebleeds, congestion, sore throat, neck pain, tinnitus and ear discharge.   Eyes: Negative for blurred vision, double vision, photophobia, pain, discharge and redness.  Respiratory: Negative for cough, hemoptysis, sputum production, shortness of breath, wheezing and stridor.   Cardiovascular: Negative for chest pain, palpitations, orthopnea, claudication and leg swelling.  Gastrointestinal: per HPI Genitourinary: Negative for dysuria, urgency, frequency, hematuria and flank pain.  Musculoskeletal: Negative for myalgias, back pain, joint pain and falls.  Skin: Negative for itching and rash.  Neurological: Negative for dizziness and weakness.  Endo/Heme/Allergies: Negative for environmental allergies and polydipsia. Does not bruise/bleed easily.  Psychiatric/Behavioral: Negative for suicidal ideas. The patient is not nervous/anxious.      Past Medical History  Diagnosis Date  . Diverticulitis   . Gout   . Hypercholesteremia   . Hypertension     Past  Surgical History  Procedure Laterality Date  . Appendectomy      Social History:  reports that he has never smoked. He does not have any smokeless tobacco history on file. He reports that he drinks alcohol. He reports that he does not use illicit drugs.  No Known Allergies  Family history of HTN   Prior to Admission medications   Medication Sig Start Date End Date Taking? Authorizing Provider  allopurinol (ZYLOPRIM) 300 MG tablet Take 300 mg by mouth daily.   Yes Historical Provider, MD  amLODipine (NORVASC) 5 MG tablet Take 5 mg by mouth daily.   Yes Historical Provider, MD  ciprofloxacin (CIPRO) 500 MG tablet Take 1 tablet (500 mg total) by mouth 2 (two) times daily. One po bid x 7 days 06/15/15  Yes April Palumbo, MD  loratadine (CLARITIN) 10 MG tablet Take 10 mg by mouth daily.   Yes Historical Provider, MD  metroNIDAZOLE (FLAGYL) 500 MG tablet Take 1 tablet (500 mg total) by mouth 3 (three) times daily. 06/15/15  Yes April Palumbo, MD  omeprazole (PRILOSEC) 40 MG capsule Take 40 mg by mouth daily.   Yes Historical Provider, MD  pravastatin (PRAVACHOL) 40 MG tablet Take 40 mg by mouth daily.   Yes Historical Provider, MD  Tamsulosin HCl (FLOMAX) 0.4 MG CAPS Take 0.4 mg by mouth daily after supper.    Yes Historical Provider, MD  ciprofloxacin (CIPRO) 500 MG tablet Take 1 tablet (500 mg total) by mouth every 12 (twelve) hours. Patient not taking: Reported on 06/20/2015 12/10/14   Pattricia Boss, MD  HYDROcodone-acetaminophen (NORCO/VICODIN) 5-325 MG per tablet Take  1 tablet by mouth every 4 (four) hours as needed for pain. Patient not taking: Reported on 06/20/2015 11/13/12   Julianne Rice, MD  metroNIDAZOLE (FLAGYL) 500 MG tablet Take 1 tablet (500 mg total) by mouth 2 (two) times daily. Patient not taking: Reported on 06/20/2015 12/10/14   Pattricia Boss, MD  ondansetron (ZOFRAN ODT) 8 MG disintegrating tablet Take 1 tablet (8 mg total) by mouth every 8 (eight) hours as needed for nausea or  vomiting. Patient not taking: Reported on 06/20/2015 12/10/14   Pattricia Boss, MD  oxyCODONE-acetaminophen (PERCOCET/ROXICET) 5-325 MG per tablet Take 1 tablet by mouth every 4 (four) hours as needed for severe pain. Patient not taking: Reported on 06/20/2015 12/10/14   Pattricia Boss, MD  traMADol (ULTRAM) 50 MG tablet Take 1 tablet (50 mg total) by mouth every 6 (six) hours as needed for severe pain. Patient not taking: Reported on 06/20/2015 06/15/15   April Palumbo, MD    Physical Exam: Filed Vitals:   06/20/15 1000 06/20/15 1059 06/20/15 1240 06/20/15 1341  BP: 141/73 141/73 140/72 132/76  Pulse: 66 73 72 70  Temp:    97.9 F (36.6 C)  TempSrc:    Oral  Resp:  18 16 18   Height:      Weight:      SpO2: 97% 95% 98% 98%    Physical Exam  Constitutional: Appears well-developed and well-nourished. No distress.  HENT: Normocephalic. External right and left ear normal. Oropharynx is clear and moist.  Eyes: Conjunctivae and EOM are normal. PERRLA, no scleral icterus.  Neck: Normal ROM. Neck supple. No JVD. No tracheal deviation. No thyromegaly.  CVS: RRR, S1/S2 +, no murmurs, no gallops, no carotid bruit.  Pulmonary: Effort and breath sounds normal, no stridor, rhonchi, wheezes, rales.  Abdominal: Soft. BS +,  no distension, tenderness in lower abd quadrants, no rebound or guarding.  Musculoskeletal: Normal range of motion. No edema and no tenderness.  Lymphadenopathy: No lymphadenopathy noted, cervical, inguinal. Neuro: Alert. Normal reflexes, muscle tone coordination. No cranial nerve deficit. Skin: Skin is warm and dry. No rash noted. Not diaphoretic. No erythema. No pallor.  Psychiatric: Normal mood and affect. Behavior, judgment, thought content normal.   Labs on Admission:  Basic Metabolic Panel:  Recent Labs Lab 06/14/15 2030 06/20/15 0855  NA 141 140  K 3.2* 3.1*  CL 105 105  CO2 26 25  GLUCOSE 105* 115*  BUN 7 15  CREATININE 1.10 1.41*  CALCIUM 8.8* 8.9   Liver  Function Tests:  Recent Labs Lab 06/14/15 2030 06/20/15 0855  AST 36 44*  ALT 43 58  ALKPHOS 56 42  BILITOT 1.1 1.1  PROT 7.1 6.7  ALBUMIN 4.2 4.0    Recent Labs Lab 06/14/15 2030 06/20/15 0855  LIPASE 18* 20*   CBC:  Recent Labs Lab 06/14/15 2030 06/20/15 0855  WBC 25.7* 21.8*  NEUTROABS 8.0* 7.6  HGB 15.0 15.5  HCT 43.5 44.7  MCV 93.5 93.1  PLT 146* 153    EKG: pending    If 7PM-7AM, please contact night-coverage [email or web address] Password [email or web address] 06/20/2015, 2:45 PM

## 2015-06-20 NOTE — Progress Notes (Signed)
74 year old male with past history of diverticulitis comes [facility] after failing several days of outpatient by mouth antibiotics for diverticulitis recurrence. CT scan only notes infection, no signs of bowel perforation or abscess. White count elevated at 21. Other vital signs stable. Patient going to [place] floor at [place], full inpatient.

## 2015-06-21 DIAGNOSIS — K5712 Diverticulitis of small intestine without perforation or abscess without bleeding: Secondary | ICD-10-CM

## 2015-06-21 LAB — CBC
HEMATOCRIT: 42.5 % (ref 39.0–52.0)
Hemoglobin: 14.8 g/dL (ref 13.0–17.0)
MCH: 33.1 pg (ref 26.0–34.0)
MCHC: 34.8 g/dL (ref 30.0–36.0)
MCV: 95.1 fL (ref 78.0–100.0)
Platelets: 156 10*3/uL (ref 150–400)
RBC: 4.47 MIL/uL (ref 4.22–5.81)
RDW: 13.5 % (ref 11.5–15.5)
WBC: 17.8 10*3/uL — AB (ref 4.0–10.5)

## 2015-06-21 LAB — BASIC METABOLIC PANEL
ANION GAP: 7 (ref 5–15)
BUN: 9 mg/dL (ref 6–20)
CHLORIDE: 106 mmol/L (ref 101–111)
CO2: 27 mmol/L (ref 22–32)
Calcium: 8.4 mg/dL — ABNORMAL LOW (ref 8.9–10.3)
Creatinine, Ser: 0.98 mg/dL (ref 0.61–1.24)
GFR calc non Af Amer: >60 mL/min (ref >60)
Glucose, Bld: 105 mg/dL — ABNORMAL HIGH (ref 65–99)
POTASSIUM: 3.1 mmol/L — AB (ref 3.5–5.1)
Sodium: 140 mmol/L (ref 135–145)

## 2015-06-21 LAB — PATHOLOGIST SMEAR REVIEW

## 2015-06-21 LAB — C DIFFICILE QUICK SCREEN W PCR REFLEX
C DIFFICILE (CDIFF) TOXIN: NEGATIVE
C Diff antigen: POSITIVE — AB

## 2015-06-21 MED ORDER — SACCHAROMYCES BOULARDII 250 MG PO CAPS
250.0000 mg | ORAL_CAPSULE | Freq: Two times a day (BID) | ORAL | Status: DC
  Administered 2015-06-21 – 2015-06-22 (×3): 250 mg via ORAL
  Filled 2015-06-21 (×3): qty 1

## 2015-06-21 MED ORDER — CIPROFLOXACIN HCL 500 MG PO TABS
500.0000 mg | ORAL_TABLET | Freq: Two times a day (BID) | ORAL | Status: DC
  Administered 2015-06-21 – 2015-06-22 (×2): 500 mg via ORAL
  Filled 2015-06-21 (×2): qty 1

## 2015-06-21 MED ORDER — METRONIDAZOLE 500 MG PO TABS
500.0000 mg | ORAL_TABLET | Freq: Three times a day (TID) | ORAL | Status: DC
  Administered 2015-06-21 – 2015-06-22 (×3): 500 mg via ORAL
  Filled 2015-06-21 (×3): qty 1

## 2015-06-21 MED ORDER — POTASSIUM CHLORIDE CRYS ER 20 MEQ PO TBCR
40.0000 meq | EXTENDED_RELEASE_TABLET | ORAL | Status: AC
  Administered 2015-06-21 (×3): 40 meq via ORAL
  Filled 2015-06-21 (×3): qty 2

## 2015-06-21 MED ORDER — FAMOTIDINE 20 MG PO TABS
40.0000 mg | ORAL_TABLET | Freq: Every day | ORAL | Status: DC
  Administered 2015-06-21 – 2015-06-22 (×2): 40 mg via ORAL
  Filled 2015-06-21 (×2): qty 2

## 2015-06-21 NOTE — Progress Notes (Signed)
[facility] PROGRESS NOTE  Corey [name] HBZ:[id number] DOB: 1941/05/28 DOA: 06/20/2015 PCP: Stephens Shire, MD  Assessment/Plan: Abdominal pain, LLQ - secondary to acute diverticulitis  - treated with IV Cipro and Flagyl; patient feeling better and willing to transition meds to PO to assess for tolerance and anticipate discharge - advance diet as pt able to tolerate  -continue PRN antiemetics and analgesics   Diverticulitis - management with Cipro and Flagyl  -low residue diet -PRN analgesics and antiemetics  Nausea and non bloody vomiting/with diarrhea -continue PRN antiemetics as needed -IVF resuscitation -check for C. Diff -continue supportive care  SIRS  -Patient meets SIRS/early sepsis criteria with HR up to 102, WBC 21.8 K, source diverticulitis and renal failure -will continue cipro and flagyl -lactic acid WNL -will follow culture data results   Acute renal injury - pre renal in etiology and secondary to dehydration - creainine responded appropriate to IVF's -will decrease IVF's rate, advance diet more and follow renal function  Hypokalemia -secondary to diarrhea and vomiting -will replete as needed -will check Mg level  Benign essential HTN - continue Norvasc  -BP under fair control -pain might be contributing to current elevated BP  Code Status: Full Family Communication: no family at bedside Disposition Plan: remains inpatient; will attempt transition to PO antibiotics and advance diet; follow response    Consultants:  None   Procedures:  See below for x-ray reports   Antibiotics:  Metronidazole  9/26  Ciprofloxacin  9/26  HPI/Subjective: No fever, no nausea, no vomiting and with some improvement in his abd pain.  Objective: Filed Vitals:   06/21/15 0533  BP: 151/68  Pulse: 67  Temp: 98.1 F (36.7 C)  Resp: 16    Intake/Output Summary (Last 24 hours) at 06/21/15 1006 Last data filed at 06/20/15 1059  Gross per 24 hour   Intake   1000 ml  Output      0 ml  Net   1000 ml   Filed Weights   06/20/15 0830  Weight: 79.379 kg (175 lb)    Exam:   General:  Afebrile, no CP, no SOB. Reports improving in his abd pain and denies N/V. Willing to try PO meds and has diet advance further  Cardiovascular: S1 and S2, No rubs orgallops.  Respiratory: CTA bilaterally  Abdomen: soft, TTP on his LLQ and with positive rebound, no guarding, positive BS  Musculoskeletal: no edema, no cyanosis or clubbing   Data Reviewed: Basic Metabolic Panel:  Recent Labs Lab 06/14/15 2030 06/20/15 0855 06/20/15 1806 06/21/15 0408  NA 141 140  --  140  K 3.2* 3.1*  --  3.1*  CL 105 105  --  106  CO2 26 25  --  27  GLUCOSE 105* 115*  --  105*  BUN 7 15  --  9  CREATININE 1.10 1.41* 1.01 0.98  CALCIUM 8.8* 8.9  --  8.4*   Liver Function Tests:  Recent Labs Lab 06/14/15 2030 06/20/15 0855  AST 36 44*  ALT 43 58  ALKPHOS 56 42  BILITOT 1.1 1.1  PROT 7.1 6.7  ALBUMIN 4.2 4.0    Recent Labs Lab 06/14/15 2030 06/20/15 0855  LIPASE 18* 20*   CBC:  Recent Labs Lab 06/14/15 2030 06/20/15 0855 06/20/15 1806 06/21/15 0408  WBC 25.7* 21.8* 20.7* 17.8*  NEUTROABS 8.0* 7.6 6.0  --   HGB 15.0 15.5 15.0 14.8  HCT 43.5 44.7 43.6 42.5  MCV 93.5 93.1 94.2 95.1  PLT  146* 153 147* 156   Recent Results (from the past 240 hour(s))  C difficile quick scan w PCR reflex     Status: Abnormal   Collection Time: 06/21/15  5:24 AM  Result Value Ref Range Status   C Diff antigen POSITIVE (A) NEGATIVE Final   C Diff toxin NEGATIVE NEGATIVE Final   C Diff interpretation   Final    C. difficile present, but toxin not detected. This indicates colonization. In most cases, this does not require treatment. If patient has signs and symptoms consistent with colitis, consider treatment.     Studies: Ct Abdomen Pelvis W Contrast  06/20/2015   CLINICAL DATA:  Abdominal pain, diarrhea  EXAM: CT ABDOMEN AND PELVIS WITH CONTRAST   TECHNIQUE: Multidetector CT imaging of the abdomen and pelvis was performed using the standard protocol following bolus administration of intravenous contrast.  CONTRAST:  116mL OMNIPAQUE IOHEXOL 300 MG/ML SOLN, 10mL OMNIPAQUE IOHEXOL 300 MG/ML SOLN  COMPARISON:  06/14/2015  FINDINGS: Lower chest: Lung bases are clear. Normal heart size. Coronary artery atherosclerosis in the RCA.  Hepatobiliary: Diffuse low attenuation of the liver consistent with hepatic steatosis. No intrahepatic or extrahepatic biliary ductal dilatation. No focal mass. Normal gallbladder.  Pancreas: Normal.  Spleen: Normal.  Adrenals/Urinary Tract: Normal adrenal glands. No urolithiasis or obstructive uropathy. 10 mm hypodense, fluid attenuating posterior right interpolar renal mass most consistent with a cyst. 2 cm hypodense, fluid attenuating left anterior interpolar renal mass most consistent with a cyst. 15 mm hypodense, fluid attenuating left posterior interpolar renal mass most consistent with a cyst. 210 mm inferior pole left renal masses most consistent with cysts. Normal bladder.  Stomach/Bowel: No bowel dilatation to suggest obstruction. Severe sigmoid diverticulosis with mild bowel wall thickening and inflammatory changes. No pneumatosis, pneumoperitoneum or portal venous gas. No peridiverticular fluid collection. No abdominal or pelvic free fluid.  Vascular/Lymphatic: Normal caliber abdominal aorta with mild atherosclerosis. No abdominal or pelvic lymphadenopathy.  Other: No fluid collection or hematoma.  Musculoskeletal: No acute osseous abnormality. No lytic or sclerotic osseous lesion.  IMPRESSION: 1. Mild acute diverticulitis of the sigmoid colon. No focal fluid collection to suggest an abscess. No significant interval change compared with 06/14/2015. 2. Hepatic steatosis. 3. Coronary artery atherosclerosis. 4. Bilateral renal cysts.   Electronically Signed   By: Kathreen Devoid   On: 06/20/2015 10:56   Dg Chest Port 1  View  06/20/2015   CLINICAL DATA:  Nausea vomiting for 3 days  EXAM: PORTABLE CHEST 1 VIEW  COMPARISON:  Jan 31, 2007  FINDINGS: The heart size and mediastinal contours are within normal limits. Both lungs are clear. The visualized skeletal structures are unremarkable.  IMPRESSION: No active cardiopulmonary disease.   Electronically Signed   By: Abelardo Diesel M.D.   On: 06/20/2015 17:28    Scheduled Meds: . allopurinol  300 mg Oral Daily  . amLODipine  5 mg Oral Daily  . ciprofloxacin  500 mg Oral BID  . famotidine  40 mg Oral Daily  . loratadine  10 mg Oral Daily  . metroNIDAZOLE  500 mg Oral 3 times per day  . potassium chloride  40 mEq Oral Q4H  . pravastatin  40 mg Oral Daily  . saccharomyces boulardii  250 mg Oral BID  . tamsulosin  0.4 mg Oral QPC supper   Continuous Infusions: . sodium chloride 75 mL/hr at 06/20/15 1815    Principal Problem:   Abdominal pain, epigastric Active Problems:   Diverticulitis   Sepsis  Hypokalemia   Acute renal injury   Benign essential HTN   Failure of outpatient treatment    Time spent: 30 minutes    Barton Dubois  [facility] Pager [phone]. If 7PM-7AM, please contact night-coverage at [email or web address], password [facility] 06/21/2015, 10:06 AM  LOS: 1 day

## 2015-06-22 ENCOUNTER — Other Ambulatory Visit: Payer: Medicare Other

## 2015-06-22 LAB — CBC
HEMATOCRIT: 40.3 % (ref 39.0–52.0)
Hemoglobin: 13.8 g/dL (ref 13.0–17.0)
MCH: 32.2 pg (ref 26.0–34.0)
MCHC: 34.2 g/dL (ref 30.0–36.0)
MCV: 93.9 fL (ref 78.0–100.0)
Platelets: 154 10*3/uL (ref 150–400)
RBC: 4.29 MIL/uL (ref 4.22–5.81)
RDW: 13.4 % (ref 11.5–15.5)
WBC: 19.1 10*3/uL — AB (ref 4.0–10.5)

## 2015-06-22 LAB — BASIC METABOLIC PANEL
Anion gap: 8 (ref 5–15)
BUN: 11 mg/dL (ref 6–20)
CHLORIDE: 108 mmol/L (ref 101–111)
CO2: 25 mmol/L (ref 22–32)
Calcium: 8.6 mg/dL — ABNORMAL LOW (ref 8.9–10.3)
Creatinine, Ser: 1.14 mg/dL (ref 0.61–1.24)
GFR calc Af Amer: >60 mL/min (ref >60)
GFR calc non Af Amer: >60 mL/min (ref >60)
Glucose, Bld: 87 mg/dL (ref 65–99)
POTASSIUM: 4 mmol/L (ref 3.5–5.1)
SODIUM: 141 mmol/L (ref 135–145)

## 2015-06-22 LAB — MAGNESIUM: Magnesium: 1.8 mg/dL (ref 1.7–2.4)

## 2015-06-22 MED ORDER — SACCHAROMYCES BOULARDII 250 MG PO CAPS: 250.0000 mg | ORAL_CAPSULE | Freq: Two times a day (BID) | ORAL | Status: DC

## 2015-06-22 MED ORDER — METRONIDAZOLE 500 MG PO TABS: 500.0000 mg | ORAL_TABLET | Freq: Three times a day (TID) | ORAL | Status: DC

## 2015-06-22 MED ORDER — CIPROFLOXACIN HCL 500 MG PO TABS: 500.0000 mg | ORAL_TABLET | Freq: Two times a day (BID) | ORAL | Status: DC

## 2015-06-22 NOTE — Progress Notes (Signed)
Patient d/c home, on stable condition.

## 2015-06-22 NOTE — Care Management Note (Signed)
Case Management Note  Patient Details  Name: LAVONTAY KIRK MRN: [id number] Date of Birth: 10-21-1940  Subjective/Objective:              74 yo admitted with Abdominal pain      Action/Plan: From home alone  Expected Discharge Date:   (unknown)               Expected Discharge Plan:  Home/Self Care  In-House Referral:     Discharge planning Services  CM Consult  Post Acute Care Choice:    Choice offered to:     DME Arranged:    DME Agency:     HH Arranged:    [place] Agency:     Status of Service:  Completed, signed off  Medicare Important Message Given:    Date Medicare IM Given:    Medicare IM give by:    Date Additional Medicare IM Given:    Additional Medicare Important Message give by:     If discussed at [place] of Stay Meetings, dates discussed:    Additional Comments: Chart reviewed for DC needs. Pt with PCP and independent at home. No CM needs identified. Lynnell Catalan, RN 06/22/2015, 9:59 AM

## 2015-06-22 NOTE — Progress Notes (Signed)
Discharge instructions and prescriptions given to patient,verbalized understanding. Denies pain. Tolerated his diet this am.

## 2015-06-22 NOTE — Discharge Summary (Signed)
Physician Discharge Summary  Corey [name] KNL:[id number] DOB: 07/01/1941 DOA: 06/20/2015  PCP: Stephens Shire, MD  Admit date: 06/20/2015 Discharge date: 06/22/2015  Time spent: 35 minutes  Recommendations for Outpatient Follow-up:  1. Please follow-up on acute diverticulitis, he was discharged on oral ciprofloxacin and Flagyl 2. Repeat BMP on hospital follow-up, he was hypokalemic during this hospitalization  3. Repeat CBC on hospital follow-up visit, had leukocytosis secondary to acute diverticulitis  Discharge Diagnoses:  Principal Problem:   Abdominal pain, epigastric Active Problems:   Diverticulitis   Sepsis   Hypokalemia   Acute renal injury   Benign essential HTN   Failure of outpatient treatment   Discharge Condition: Stable/improved  Diet recommendation: Heart healthy  Filed Weights   06/20/15 0830  Weight: 79.379 kg (175 lb)    History of present illness:  Patient is 74 year old male who presented to [facility] emergency department for evaluation of lower left quadrant abdominal pain and was transferred to [facility] for further evaluation. Patient reports pain started 7 days ago, mostly in the lower abdominal quadrants, intermittent initially and throbbing, currently constant and sharp, 10/10 in severity, worse in the left lower quadrant and occasionally but not consistently radiating to epigastric area. Patient reports this has been associated with nausea, multiple episodes of nonbloody vomiting, 2 episodes of watery diarrhea this morning, poor oral intake and malaise. Pt reports he was prescribed Cipro and flagyl but was unable to tolerate PO medications due to vomiting. Patient reports similar events in the past and this episode appears to be related to same thing. Patient reports having colonoscopy done in the past, follows with Dr. Collene Mares. Pt is not sure what the results of previous colonoscopies were but thinks it was normal.   In emergency department,  patient noted to be hemodynamically stable, vital signs stable, blood work notable for WBC 21, K 3.1, Cr 1.41. Imaging studies notable for mild acute diverticulitis of the sigmoid colon. Patient started on Cipro and Flagyl and [facility] admitted to medical unit.  Hospital Course:  Patient is a pleasant 74 year old woman with a history of diverticulitis who is admitted to the medicine service on 06/20/2015 presenting as a transfer from [facility]. He initially presented at facility with complaints of left lower abdominal pain that was further worked up with a CT scan of abdomen and pelvis with IV contrast showing findings suggestive of acute mild diverticulitis of the sigmoid colon. Radiology did not report focal fluid collection to suggest an abscess. He was treated with IV antimicrobial therapy with Ciprofloxacin and Flagyl. He showed gradual clinical improvement as his diet was advanced. He remained afebrile and hemodynamically stable. On 06/22/2015 he expresses wishes to be discharged home, as he was tolerating by mouth intake, remained afebrile, reporting significant improvement to his abdominal pain. He was transitioned to oral ciprofloxacin and Flagyl. He was discharged to his home in stable condition on 06/22/2015.   Discharge Exam: Filed Vitals:   06/22/15 0532  BP: 133/71  Pulse: 62  Temp: 97.8 F (36.6 C)  Resp: 16    General: He is awake, alert, anxious to go home today. Nontoxic-appearing, tolerating by mouth intake. Cardiovascular: Regular rate and rhythm normal S1-S2 no murmurs rubs or gallops Respiratory: Clear to auscultation bilaterally no wheezing rhonchi or rales Abdomen: He has some pain with deep palpation over the left lower quadrant region, otherwise no guarding or rebound tenderness. The rest of his abdominal exam was benign. Extremities: No edema  Discharge Instructions   Discharge Instructions    Call MD for:  difficulty breathing, headache or visual  disturbances    Complete by:  As directed      Call MD for:  extreme fatigue    Complete by:  As directed      Call MD for:  hives    Complete by:  As directed      Call MD for:  persistant dizziness or light-headedness    Complete by:  As directed      Call MD for:  persistant nausea and vomiting    Complete by:  As directed      Call MD for:  redness, tenderness, or signs of infection (pain, swelling, redness, odor or green/yellow discharge around incision site)    Complete by:  As directed      Call MD for:  severe uncontrolled pain    Complete by:  As directed      Call MD for:  temperature >100.4    Complete by:  As directed      Call MD for:    Complete by:  As directed      Diet - low sodium heart healthy    Complete by:  As directed      Increase activity slowly    Complete by:  As directed           Current Discharge Medication List    START taking these medications   Details  saccharomyces boulardii (FLORASTOR) 250 MG capsule Take 1 capsule (250 mg total) by mouth 2 (two) times daily. Qty: 20 capsule, Refills: 0      CONTINUE these medications which have CHANGED   Details  ciprofloxacin (CIPRO) 500 MG tablet Take 1 tablet (500 mg total) by mouth 2 (two) times daily. Qty: 10 tablet, Refills: 0    metroNIDAZOLE (FLAGYL) 500 MG tablet Take 1 tablet (500 mg total) by mouth 3 (three) times daily. Qty: 15 tablet, Refills: 0      CONTINUE these medications which have NOT CHANGED   Details  allopurinol (ZYLOPRIM) 300 MG tablet Take 300 mg by mouth daily.    amLODipine (NORVASC) 5 MG tablet Take 5 mg by mouth daily.    loratadine (CLARITIN) 10 MG tablet Take 10 mg by mouth daily.    omeprazole (PRILOSEC) 40 MG capsule Take 40 mg by mouth daily.    pravastatin (PRAVACHOL) 40 MG tablet Take 40 mg by mouth daily.    Tamsulosin HCl (FLOMAX) 0.4 MG CAPS Take 0.4 mg by mouth daily after supper.     HYDROcodone-acetaminophen (NORCO/VICODIN) 5-325 MG per tablet Take 1  tablet by mouth every 4 (four) hours as needed for pain. Qty: 10 tablet, Refills: 0    ondansetron (ZOFRAN ODT) 8 MG disintegrating tablet Take 1 tablet (8 mg total) by mouth every 8 (eight) hours as needed for nausea or vomiting. Qty: 20 tablet, Refills: 0      STOP taking these medications     oxyCODONE-acetaminophen (PERCOCET/ROXICET) 5-325 MG per tablet      traMADol (ULTRAM) 50 MG tablet        No Known Allergies Follow-up Information    Follow up with BURNETT,BRENT A, MD In 1 week.   Specialty:  Family Medicine   Contact information:   [address] [place] [address] [phone]        The results of significant diagnostics from this hospitalization (including imaging, microbiology, ancillary and laboratory) are  listed below for reference.    Significant Diagnostic Studies: Ct Abdomen Pelvis W Contrast  06/20/2015   CLINICAL DATA:  Abdominal pain, diarrhea  EXAM: CT ABDOMEN AND PELVIS WITH CONTRAST  TECHNIQUE: Multidetector CT imaging of the abdomen and pelvis was performed using the standard protocol following bolus administration of intravenous contrast.  CONTRAST:  114mL OMNIPAQUE IOHEXOL 300 MG/ML SOLN, 27mL OMNIPAQUE IOHEXOL 300 MG/ML SOLN  COMPARISON:  06/14/2015  FINDINGS: Lower chest: Lung bases are clear. Normal heart size. Coronary artery atherosclerosis in the RCA.  Hepatobiliary: Diffuse low attenuation of the liver consistent with hepatic steatosis. No intrahepatic or extrahepatic biliary ductal dilatation. No focal mass. Normal gallbladder.  Pancreas: Normal.  Spleen: Normal.  Adrenals/Urinary Tract: Normal adrenal glands. No urolithiasis or obstructive uropathy. 10 mm hypodense, fluid attenuating posterior right interpolar renal mass most consistent with a cyst. 2 cm hypodense, fluid attenuating left anterior interpolar renal mass most consistent with a cyst. 15 mm hypodense, fluid attenuating left posterior interpolar renal mass most  consistent with a cyst. 210 mm inferior pole left renal masses most consistent with cysts. Normal bladder.  Stomach/Bowel: No bowel dilatation to suggest obstruction. Severe sigmoid diverticulosis with mild bowel wall thickening and inflammatory changes. No pneumatosis, pneumoperitoneum or portal venous gas. No peridiverticular fluid collection. No abdominal or pelvic free fluid.  Vascular/Lymphatic: Normal caliber abdominal aorta with mild atherosclerosis. No abdominal or pelvic lymphadenopathy.  Other: No fluid collection or hematoma.  Musculoskeletal: No acute osseous abnormality. No lytic or sclerotic osseous lesion.  IMPRESSION: 1. Mild acute diverticulitis of the sigmoid colon. No focal fluid collection to suggest an abscess. No significant interval change compared with 06/14/2015. 2. Hepatic steatosis. 3. Coronary artery atherosclerosis. 4. Bilateral renal cysts.   Electronically Signed   By: Kathreen Devoid   On: 06/20/2015 10:56   Ct Abdomen Pelvis W Contrast  06/15/2015   CLINICAL DATA:  Low abd pain, ? diverticulitis, Hx appendectomy, diverticulitis 172mL omni 300^51mL OMNIPAQUE IOHEXOL 300 MG/ML SOLN, 16mL OMNIPAQUE IOHEXOL 300 MG/ML SOLN  EXAM: CT ABDOMEN AND PELVIS WITH CONTRAST  TECHNIQUE: Multidetector CT imaging of the abdomen and pelvis was performed using the standard protocol following bolus administration of intravenous contrast.  CONTRAST:  73mL OMNIPAQUE IOHEXOL 300 MG/ML SOLN, 177mL OMNIPAQUE IOHEXOL 300 MG/ML SOLN  COMPARISON:  CT of the abdomen and pelvis 12/10/2014  FINDINGS: Lower chest: No pulmonary nodules, pleural effusions, or infiltrates. Coronary artery calcifications are present. No pericardial effusion.  Upper abdomen: No focal abnormality identified within the spleen, pancreas, or adrenal glands. There is a 5 mm nodule within the posterior segment of the right hepatic lobe, likely benign. There are numerous low-attenuation lesions within both kidneys and consistent with cysts.  No hydronephrosis. The gallbladder is present.  Gastrointestinal tract: The stomach and small bowel loops are normal in appearance. Previous appendectomy. There are numerous colonic diverticula. There is mild inflammation associated with the sigmoid segment. No associated abscess or free air.  Pelvis: Urinary bladder has a normal appearance. Prostatic calcifications are present. No free pelvic fluid.  Retroperitoneum: Atherosclerotic calcification of the abdominal aorta. No aneurysm. Small, nonspecific mesenteric root lymph nodes are identified, stable in appearance.  Abdominal wall: Unremarkable.  Osseous structures: No suspicious lytic or blastic lesions are identified. Mild degenerative changes.  IMPRESSION: 1. Significant sigmoid diverticulosis. Associated inflammatory change consistent with acute diverticulitis. No associated abscess or perforation. 2. Coronary artery disease. 3. Appendectomy. 4. Probable right hepatic cyst. 5. Numerous renal cysts.   Electronically Signed  By: Nolon Nations M.D.   On: 06/15/2015 00:05   Dg Chest Port 1 View  06/20/2015   CLINICAL DATA:  Nausea vomiting for 3 days  EXAM: PORTABLE CHEST 1 VIEW  COMPARISON:  Jan 31, 2007  FINDINGS: The heart size and mediastinal contours are within normal limits. Both lungs are clear. The visualized skeletal structures are unremarkable.  IMPRESSION: No active cardiopulmonary disease.   Electronically Signed   By: Abelardo Diesel M.D.   On: 06/20/2015 17:28    Microbiology: Recent Results (from the past 240 hour(s))  Culture, blood (x 2)     Status: None (Preliminary result)   Collection Time: 06/20/15  6:00 PM  Result Value Ref Range Status   Specimen Description BLOOD LEFT ARM  Final   Special Requests BOTTLES DRAWN AEROBIC AND ANAEROBIC 5CC  Final   Culture   Final    NO GROWTH < 24 HOURS Performed at [facility]    Report Status PENDING  Incomplete  Culture, blood (x 2)     Status: None (Preliminary result)    Collection Time: 06/20/15  6:13 PM  Result Value Ref Range Status   Specimen Description BLOOD LEFT HAND  Final   Special Requests BOTTLES DRAWN AEROBIC ONLY 5CC  Final   Culture   Final    NO GROWTH < 24 HOURS Performed at [facility]    Report Status PENDING  Incomplete  C difficile quick scan w PCR reflex     Status: Abnormal   Collection Time: 06/21/15  5:24 AM  Result Value Ref Range Status   C Diff antigen POSITIVE (A) NEGATIVE Final   C Diff toxin NEGATIVE NEGATIVE Final   C Diff interpretation   Final    C. difficile present, but toxin not detected. This indicates colonization. In most cases, this does not require treatment. If patient has signs and symptoms consistent with colitis, consider treatment.     Labs: Basic Metabolic Panel:  Recent Labs Lab 06/20/15 0855 06/20/15 1806 06/21/15 0408 06/22/15 0450  NA 140  --  140 141  K 3.1*  --  3.1* 4.0  CL 105  --  106 108  CO2 25  --  27 25  GLUCOSE 115*  --  105* 87  BUN 15  --  9 11  CREATININE 1.41* 1.01 0.98 1.14  CALCIUM 8.9  --  8.4* 8.6*  MG  --   --   --  1.8   Liver Function Tests:  Recent Labs Lab 06/20/15 0855  AST 44*  ALT 58  ALKPHOS 42  BILITOT 1.1  PROT 6.7  ALBUMIN 4.0    Recent Labs Lab 06/20/15 0855  LIPASE 20*   No results for input(s): AMMONIA in the last 168 hours. CBC:  Recent Labs Lab 06/20/15 0855 06/20/15 1806 06/21/15 0408 06/22/15 0450  WBC 21.8* 20.7* 17.8* 19.1*  NEUTROABS 7.6 6.0  --   --   HGB 15.5 15.0 14.8 13.8  HCT 44.7 43.6 42.5 40.3  MCV 93.1 94.2 95.1 93.9  PLT 153 147* 156 154   Cardiac Enzymes: No results for input(s): CKTOTAL, CKMB, CKMBINDEX, TROPONINI in the last 168 hours. BNP: BNP (last 3 results) No results for input(s): BNP in the last 8760 hours.  ProBNP (last 3 results) No results for input(s): PROBNP in the last 8760 hours.  CBG: No results for input(s): GLUCAP in the last 168 hours.     Signed:  Kelvin Cellar  [facility] 06/22/2015, 9:47 AM

## 2015-06-23 LAB — GI PATHOGEN PANEL BY PCR, STOOL
C difficile toxin A/B: NOT DETECTED
Campylobacter by PCR: NOT DETECTED
Cryptosporidium by PCR: NOT DETECTED
E COLI (ETEC) LT/ST: NOT DETECTED
E COLI 0157 BY PCR: NOT DETECTED
E coli (STEC): NOT DETECTED
G LAMBLIA BY PCR: NOT DETECTED
Norovirus GI/GII: NOT DETECTED
Rotavirus A by PCR: NOT DETECTED
SHIGELLA BY PCR: NOT DETECTED
Salmonella by PCR: NOT DETECTED

## 2015-06-25 LAB — CULTURE, BLOOD (ROUTINE X 2)
CULTURE: NO GROWTH
Culture: NO GROWTH

## 2016-07-30 ENCOUNTER — Other Ambulatory Visit: Payer: Self-pay | Admitting: Oncology

## 2016-07-30 DIAGNOSIS — D72829 Elevated white blood cell count, unspecified: Secondary | ICD-10-CM

## 2016-07-31 ENCOUNTER — Telehealth: Payer: Self-pay | Admitting: Oncology

## 2016-07-31 NOTE — Telephone Encounter (Signed)
lvm to inform pt of 11/2 appt date/time per LOS

## 2016-08-13 ENCOUNTER — Other Ambulatory Visit (HOSPITAL_COMMUNITY)
Admission: RE | Admit: 2016-08-13 | Discharge: 2016-08-13 | Disposition: A | Discharge status: Home / Self Care | Payer: Medicare Other | Source: Ambulatory Visit | Attending: Oncology | Admitting: Oncology

## 2016-08-13 ENCOUNTER — Other Ambulatory Visit: Payer: Self-pay | Admitting: *Deleted

## 2016-08-13 ENCOUNTER — Telehealth: Payer: Self-pay | Admitting: Oncology

## 2016-08-13 ENCOUNTER — Other Ambulatory Visit (HOSPITAL_BASED_OUTPATIENT_CLINIC_OR_DEPARTMENT_OTHER): Payer: Medicare Other

## 2016-08-13 ENCOUNTER — Ambulatory Visit (HOSPITAL_BASED_OUTPATIENT_CLINIC_OR_DEPARTMENT_OTHER): Payer: Medicare Other | Admitting: Oncology

## 2016-08-13 VITALS — BP 159/79 | HR 64 | Temp 98.3°F | Resp 18 | Ht 69.0 in | Wt 182.3 lb

## 2016-08-13 DIAGNOSIS — D72820 Lymphocytosis (symptomatic): Secondary | ICD-10-CM

## 2016-08-13 DIAGNOSIS — D72829 Elevated white blood cell count, unspecified: Secondary | ICD-10-CM

## 2016-08-13 DIAGNOSIS — D751 Secondary polycythemia: Secondary | ICD-10-CM | POA: Diagnosis not present

## 2016-08-13 LAB — COMPREHENSIVE METABOLIC PANEL
ALT: 45 U/L (ref 0–55)
AST: 45 U/L — AB (ref 5–34)
Albumin: 3.6 g/dL (ref 3.5–5.0)
Alkaline Phosphatase: 66 U/L (ref 40–150)
Anion Gap: 11 mEq/L (ref 3–11)
BILIRUBIN TOTAL: 1.14 mg/dL (ref 0.20–1.20)
BUN: 5.9 mg/dL — ABNORMAL LOW (ref 7.0–26.0)
CO2: 26 meq/L (ref 22–29)
CREATININE: 1 mg/dL (ref 0.7–1.3)
Calcium: 9 mg/dL (ref 8.4–10.4)
Chloride: 105 mEq/L (ref 98–109)
EGFR: 72 mL/min/{1.73_m2} — ABNORMAL LOW (ref >90)
GLUCOSE: 117 mg/dL (ref 70–140)
Potassium: 3.5 mEq/L (ref 3.5–5.1)
SODIUM: 142 meq/L (ref 136–145)
TOTAL PROTEIN: 6.6 g/dL (ref 6.4–8.3)

## 2016-08-13 LAB — CBC WITH DIFFERENTIAL/PLATELET
BASO%: 0.2 % (ref 0.0–2.0)
Basophils Absolute: 0.1 10*3/uL (ref 0.0–0.1)
EOS ABS: 0.1 10*3/uL (ref 0.0–0.5)
EOS%: 0.3 % (ref 0.0–7.0)
HCT: 55.5 % — ABNORMAL HIGH (ref 38.4–49.9)
HGB: 19.2 g/dL — ABNORMAL HIGH (ref 13.0–17.1)
LYMPH%: 83.5 % — ABNORMAL HIGH (ref 14.0–49.0)
MCH: 32.4 pg (ref 27.2–33.4)
MCHC: 34.6 g/dL (ref 32.0–36.0)
MCV: 93.6 fL (ref 79.3–98.0)
MONO#: 0.6 10*3/uL (ref 0.1–0.9)
MONO%: 2.3 % (ref 0.0–14.0)
NEUT%: 13.7 % — ABNORMAL LOW (ref 39.0–75.0)
NEUTROS ABS: 3.4 10*3/uL (ref 1.5–6.5)
NRBC: 0 % (ref 0–0)
Platelets: 136 10*3/uL — ABNORMAL LOW (ref 140–400)
RBC: 5.93 10*6/uL — AB (ref 4.20–5.82)
RDW: 13.2 % (ref 11.0–14.6)
WBC: 25 10*3/uL — AB (ref 4.0–10.3)
lymph#: 20.9 10*3/uL — ABNORMAL HIGH (ref 0.9–3.3)

## 2016-08-13 LAB — TECHNOLOGIST REVIEW

## 2016-08-13 NOTE — Telephone Encounter (Signed)
Appointments scheduled per 11/20 LOS. Patient given AVS report and calendars with future scheduled appointments. °

## 2016-08-13 NOTE — Progress Notes (Signed)
Hematology and Oncology Follow Up Visit  Corey [name] [id number] [birth year] 75 y.o. 08/13/2016 9:06 AM Corey [name], MDBurnett, Corey Santee, MD   Principle Diagnosis: 75 year old gentleman with leukocytosis. The differential diagnosis including reactive finding versus lymphoproliferative disorder or myeloproliferative disorder. These findings diagnosed in 2016.  Current therapy: Observation and surveillance.  Interim History:  Corey [name] presents today for [name] follow-up visit. He is [name] gentleman I saw in consultation back in 2016 for leukocytosis after an episode of diverticulitis. 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 His white cell counts have ranged between 25,000 but also drifted down to 17,000 after his bout resolves. Most recently, he had [name] CBC done and showed persistent elevation in his white cell count around 24,000 with hemoglobin around 19. He did have some lymphocytosis on his differential. Clinically, he is asymptomatic at this time. He does not report any abdominal pain, fevers or weight loss. His appetite remains excellent and gaining more weight. He denied any lymphadenopathy or gross satiety. His CT scan in September 2016 mention any evidence of malignancy.   He does not report any headaches, blurry vision, double vision, syncope or seizure. He does not report any fevers, chills, sweats. He does not report any night sweats or fevers or chills. He does not report any chest pain, palpitation, orthopnea or leg edema. He does not report any cough, hemoptysis or hematemesis. He does not report any nausea, vomiting does report abdominal pain but no hematochezia or melena. He does not report any frequency, urgency, hesitancy. He does not report any skeletal complaints. He does not report any lymphadenopathy or petechiae. Remaining review of systems unremarkable  Medications: I have reviewed the patient's current medications.  Current  Outpatient Prescriptions  Medication Sig Dispense Refill  . omeprazole (PRILOSEC) 40 MG capsule Take 40 mg by mouth daily.    Marland Kitchen allopurinol (ZYLOPRIM) 300 MG tablet Take 300 mg by mouth daily.    Marland Kitchen amLODipine (NORVASC) 5 MG tablet Take 5 mg by mouth daily.    . pravastatin (PRAVACHOL) 40 MG tablet Take 40 mg by mouth daily.    . Tamsulosin HCl (FLOMAX) 0.4 MG CAPS Take 0.4 mg by mouth daily after supper.      No current facility-administered medications for this visit.      Allergies: No Known Allergies  Past Medical History, Surgical history, Social history, and Family History were reviewed and updated.  Marland Kitchen Physical Exam: Blood pressure (!) 159/79, pulse 64, temperature 98.3 F (36.8 C), temperature source Oral, resp. rate 18, height 5\' 9"  (1.753 m), weight 182 lb 4.8 oz (82.7 kg), SpO2 97 %. ECOG: 0 General appearance: alert and cooperative Head: Normocephalic, without obvious abnormality Neck: no adenopathy Lymph nodes: Cervical, supraclavicular, and axillary nodes normal. Heart:regular rate and rhythm, S1, S2 normal, no murmur, click, rub or gallop Lung:chest clear, no wheezing, rales, normal symmetric air entry.  Abdomin: soft, non-tender, without masses or organomegaly EXT:no erythema, induration, or nodules   Lab Results: Lab Results  Component Value Date   WBC 25.0 (H) 08/13/2016   HGB 19.2 (H) 08/13/2016   HCT 55.5 (H) 08/13/2016   MCV 93.6 08/13/2016   PLT 136 (L) 08/13/2016     Chemistry      Component Value Date/Time   NA 142 08/13/2016 0812   K 3.5 08/13/2016 0812   CL 108 06/22/2015 0450   CO2 26 08/13/2016 0812   BUN 5.9 (L) 08/13/2016 [id number]  CREATININE 1.0 08/13/2016 0812      Component Value Date/Time   CALCIUM 9.0 08/13/2016 0812   ALKPHOS 66 08/13/2016 0812   AST 45 (H) 08/13/2016 0812   ALT 45 08/13/2016 0812   BILITOT 1.14 08/13/2016 0812     EXAM: CT ABDOMEN AND PELVIS WITH CONTRAST  TECHNIQUE: Multidetector CT imaging of the abdomen  and pelvis was performed using the standard protocol following bolus administration of intravenous contrast.  CONTRAST:  182mL OMNIPAQUE IOHEXOL 300 MG/ML SOLN, 59mL OMNIPAQUE IOHEXOL 300 MG/ML SOLN  COMPARISON:  06/14/2015  FINDINGS: Lower chest: Lung bases are clear. Normal heart size. Coronary artery atherosclerosis in the RCA.  Hepatobiliary: Diffuse low attenuation of the liver consistent with hepatic steatosis. No intrahepatic or extrahepatic biliary ductal dilatation. No focal mass. Normal gallbladder.  Pancreas: Normal.  Spleen: Normal.  Adrenals/Urinary Tract: Normal adrenal glands. No urolithiasis or obstructive uropathy. 10 mm hypodense, fluid attenuating posterior right interpolar renal mass most consistent with [name] cyst. 2 cm hypodense, fluid attenuating left anterior interpolar renal mass most consistent with [name] cyst. 15 mm hypodense, fluid attenuating left posterior interpolar renal mass most consistent with [name] cyst. 210 mm inferior pole left renal masses most consistent with cysts. Normal bladder.  Stomach/Bowel: No bowel dilatation to suggest obstruction. Severe sigmoid diverticulosis with mild bowel wall thickening and inflammatory changes. No pneumatosis, pneumoperitoneum or portal venous gas. No peridiverticular fluid collection. No abdominal or pelvic free fluid.  Vascular/Lymphatic: Normal caliber abdominal aorta with mild atherosclerosis. No abdominal or pelvic lymphadenopathy.  Other: No fluid collection or hematoma.  Musculoskeletal: No acute osseous abnormality. No lytic or sclerotic osseous lesion.  IMPRESSION: 1. Mild acute diverticulitis of the sigmoid colon. No focal fluid collection to suggest an abscess. No significant interval change compared with 06/14/2015. 2. Hepatic steatosis. 3. Coronary artery atherosclerosis. 4. Bilateral renal cysts.  Impression and Plan:  75 year old gentleman with the following issues:  1.  Leukocytosis with lymphocytosis: The differential diagnosis was discussed again. Reactive findings related to his recurrent diverticulitis is [name] possibility but appears to be less likely now given the fact that he has recovered from this recurrent episode with persistent elevation in his white cell count.   His peripheral smear was personally reviewed today and showed smudge cells and lymphocytosis which is concerning for CLL. Lymphoproliferative disorder such as mantle cell lymphoma among others are also [name] consideration. Myeloproliferative disorder is also [name] possibility given his elevation in his hemoglobin. Condition such as CML, polycythemia vera and also [name] consideration.  From [name] management standpoint, I will obtain peripheral blood flow cytometry to determine whether he has lymphoproliferative disorder. BCR-ABL will also be obtained to rule out chronic myelogenous leukemia. JAK2 also be obtained to rule out myeloproliferative disorder.  Further management standpoint, not of these conditions require acute treatment at this point and observation surveillance will be recommended. CML will require treatment but I think it's less likely the case.  I will communicate the results of this testing to him as soon as becomes available.  2. Erythrocytosis: Evaluation for polycythemia vera already started. We have discussed management options including phlebotomy in the future it is indeed [name] part of [name] myeloproliferative disorder.  3. Follow-up: Will be in 3 months to repeat his laboratory testing sooner if [name] blood disorder been discovered that needs more recent attention.  All his questions were answered today to his satisfaction.     [facility], MD 11/20/20179:06 AM

## 2016-08-15 LAB — FLOW CYTOMETRY

## 2016-11-13 ENCOUNTER — Telehealth: Payer: Self-pay | Admitting: Oncology

## 2016-11-13 ENCOUNTER — Ambulatory Visit (HOSPITAL_BASED_OUTPATIENT_CLINIC_OR_DEPARTMENT_OTHER): Payer: Medicare Other | Admitting: Oncology

## 2016-11-13 ENCOUNTER — Other Ambulatory Visit (HOSPITAL_BASED_OUTPATIENT_CLINIC_OR_DEPARTMENT_OTHER): Payer: Medicare Other

## 2016-11-13 VITALS — BP 167/82 | HR 68 | Temp 98.2°F | Resp 18 | Ht 69.0 in | Wt 181.1 lb

## 2016-11-13 DIAGNOSIS — D751 Secondary polycythemia: Secondary | ICD-10-CM

## 2016-11-13 DIAGNOSIS — C9110 Chronic lymphocytic leukemia of B-cell type not having achieved remission: Secondary | ICD-10-CM

## 2016-11-13 DIAGNOSIS — D72820 Lymphocytosis (symptomatic): Secondary | ICD-10-CM

## 2016-11-13 LAB — CBC WITH DIFFERENTIAL/PLATELET
BASO%: 0.3 % (ref 0.0–2.0)
BASOS ABS: 0.1 10*3/uL (ref 0.0–0.1)
EOS ABS: 0.1 10*3/uL (ref 0.0–0.5)
EOS%: 0.2 % (ref 0.0–7.0)
HEMATOCRIT: 54.8 % — AB (ref 38.4–49.9)
HGB: 18.5 g/dL — ABNORMAL HIGH (ref 13.0–17.1)
LYMPH#: 20.2 10*3/uL — AB (ref 0.9–3.3)
LYMPH%: 79.5 % — ABNORMAL HIGH (ref 14.0–49.0)
MCH: 32.4 pg (ref 27.2–33.4)
MCHC: 33.8 g/dL (ref 32.0–36.0)
MCV: 95.6 fL (ref 79.3–98.0)
MONO#: 0.8 10*3/uL (ref 0.1–0.9)
MONO%: 3 % (ref 0.0–14.0)
NEUT#: 4.3 10*3/uL (ref 1.5–6.5)
NEUT%: 17 % — AB (ref 39.0–75.0)
PLATELETS: 148 10*3/uL (ref 140–400)
RBC: 5.73 10*6/uL (ref 4.20–5.82)
RDW: 13.8 % (ref 11.0–14.6)
WBC: 25.3 10*3/uL — ABNORMAL HIGH (ref 4.0–10.3)

## 2016-11-13 LAB — TECHNOLOGIST REVIEW

## 2016-11-13 NOTE — Progress Notes (Signed)
Hematology and Oncology Follow Up Visit  Corey [name] [id number] 01-16-1941 76 y.o. 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 He was found to have lymphocytosis and peripheral blood flow cytometry confirmed the diagnosis at that time.  Current therapy: Observation and surveillance.  Interim History:  [name] presents today for [name] follow-up visit. Since the last visit, he continues to be asymptomatic at this time. He does have few complaints better been chronic and not dramatically changed. He does report periodic back pain and constipation at times. He denied any hematochezia or melena.  He does not report any abdominal pain, fevers or weight loss. His appetite remains excellent and he is trying to lose weight.. He denied any lymphadenopathy or gross satiety.   He does not report any headaches, blurry vision, double vision, syncope or seizure. He does not report any fevers, chills, sweats. He does not report any night sweats or fevers or chills. He does not report any chest pain, palpitation, orthopnea or leg edema. He does not report any cough, hemoptysis or hematemesis. He does not report any nausea, vomiting does report abdominal pain but no hematochezia or melena. He does not report any frequency, urgency, hesitancy. He does not report any skeletal complaints. He does not report any lymphadenopathy or petechiae. Remaining review of systems unremarkable  Medications: I have reviewed the patient's current medications.  Current Outpatient Prescriptions  Medication Sig Dispense Refill  . allopurinol (ZYLOPRIM) 300 MG tablet Take 300 mg by mouth daily.    Marland Kitchen amLODipine (NORVASC) 5 MG tablet Take 5 mg by mouth daily.    Marland Kitchen omeprazole (PRILOSEC) 40 MG capsule Take 40 mg by mouth daily.    . pravastatin (PRAVACHOL) 40 MG tablet Take 20 mg by mouth daily.     . Tamsulosin HCl (FLOMAX) 0.4 MG CAPS  Take 0.4 mg by mouth daily after supper.      No current facility-administered medications for this visit.      Allergies: No Known Allergies  Past Medical History, Surgical history, Social history, and Family History were reviewed and updated.  Marland Kitchen Physical Exam: Blood pressure (!) 167/82, pulse 68, temperature 98.2 F (36.8 C), temperature source Oral, resp. rate 18, height 5\' 9"  (1.753 m), weight 181 lb 1.6 oz (82.1 kg), SpO2 97 %. ECOG: 0 General appearance: alert and cooperative appeared without distress. Head: Normocephalic, without obvious abnormality no oral thrush noted. Neck: no adenopathy Lymph nodes: Cervical, supraclavicular, and axillary nodes normal. Heart:regular rate and rhythm, S1, S2 normal, no murmur, click, rub or gallop Lung:chest clear, no wheezing, rales, normal symmetric air entry.  Abdomin: soft, non-tender, without masses or organomegaly no rebound or guarding. EXT:no erythema, induration, or nodules   Lab Results: Lab Results  Component Value Date   WBC 25.3 (H) 11/13/2016   HGB 18.5 (H) 11/13/2016   HCT 54.8 (H) 11/13/2016   MCV 95.6 11/13/2016   PLT 148 11/13/2016     Chemistry      Component Value Date/Time   NA 142 08/13/2016 0812   K 3.5 08/13/2016 0812   CL 108 06/22/2015 0450   CO2 26 08/13/2016 0812   BUN 5.9 (L) 08/13/2016 0812   CREATININE 1.0 08/13/2016 0812      Component Value Date/Time   CALCIUM 9.0 08/13/2016 0812   ALKPHOS 66 08/13/2016 0812   AST 45 (H) 08/13/2016 0812   ALT 45 08/13/2016 0812   BILITOT  1.14 08/13/2016 0812        Impression and Plan:  76 year old gentleman with the following issues:  1. Leukocytosis with lymphocytosis: Flow cytometry obtained in November 2017 confirmed the diagnosis of CLL.   The natural course of this disease was discussed with the patient today as well as indications for treatment. He is completely asymptomatic at this time and his disease does not warrant treatment.  Indication for treatment would be rapidly increased white cell count, cytopenias as well as constitutional symptoms.   His CBC was reviewed again today and his white cell count not any different than what it was 3 months ago. I have recommended continued observation and surveillance.  2. Erythrocytosis: Could be secondary in etiology. It is possible that he has [name] myeloproliferative disorder as well and this will be evaluated by [name] JAK2 mutation. His hemoglobin is declining at this time and does not require intervention. We will continue to monitor this moving forward.  3. Follow-up: He'll have [name] clinical visit in 6 months. He'll have repeat laboratory testing with his primary care physician in the next 3 months.     Zola Button, MD 2/20/20182:37 PM

## 2016-11-13 NOTE — Telephone Encounter (Signed)
No los per 11/13/16 los

## 2017-04-23 ENCOUNTER — Ambulatory Visit
Admission: RE | Admit: 2017-04-23 | Discharge: 2017-04-23 | Disposition: A | Discharge status: Home / Self Care | Payer: Medicare Other | Source: Ambulatory Visit | Attending: Family Medicine | Admitting: Family Medicine

## 2017-04-23 ENCOUNTER — Other Ambulatory Visit: Payer: Self-pay | Admitting: Family Medicine

## 2017-04-23 DIAGNOSIS — R109 Unspecified abdominal pain: Secondary | ICD-10-CM

## 2017-04-23 DIAGNOSIS — K5900 Constipation, unspecified: Secondary | ICD-10-CM

## 2017-04-26 ENCOUNTER — Other Ambulatory Visit: Payer: Self-pay | Admitting: Family Medicine

## 2017-04-26 DIAGNOSIS — R160 Hepatomegaly, not elsewhere classified: Secondary | ICD-10-CM

## 2017-05-10 ENCOUNTER — Ambulatory Visit
Admission: RE | Admit: 2017-05-10 | Discharge: 2017-05-10 | Disposition: A | Discharge status: Home / Self Care | Payer: Medicare Other | Source: Ambulatory Visit | Attending: Family Medicine | Admitting: Family Medicine

## 2017-05-10 DIAGNOSIS — R160 Hepatomegaly, not elsewhere classified: Secondary | ICD-10-CM

## 2017-05-10 MED ORDER — GADOBENATE DIMEGLUMINE 529 MG/ML IV SOLN
17.0000 mL | Freq: Once | INTRAVENOUS | Status: AC | PRN
  Administered 2017-05-10: 17 mL via INTRAVENOUS

## 2018-02-25 ENCOUNTER — Other Ambulatory Visit: Payer: Self-pay | Admitting: Family Medicine

## 2018-02-25 ENCOUNTER — Ambulatory Visit
Admission: RE | Admit: 2018-02-25 | Discharge: 2018-02-25 | Disposition: A | Discharge status: Home / Self Care | Payer: Medicare Other | Source: Ambulatory Visit | Attending: Family Medicine | Admitting: Family Medicine

## 2018-02-25 DIAGNOSIS — R0602 Shortness of breath: Secondary | ICD-10-CM

## 2018-03-06 ENCOUNTER — Telehealth: Payer: Self-pay | Admitting: Oncology

## 2018-03-06 NOTE — Telephone Encounter (Signed)
Called pt re adding appt per 6/12 sch msg - spoke w/ pt and they wanted to move it to the afternoon

## 2018-03-10 ENCOUNTER — Ambulatory Visit: Payer: Medicare Other | Admitting: Oncology

## 2018-03-13 ENCOUNTER — Telehealth: Payer: Self-pay | Admitting: Oncology

## 2018-03-13 ENCOUNTER — Inpatient Hospital Stay: Payer: Medicare Other | Attending: Oncology | Admitting: Oncology

## 2018-03-13 VITALS — BP 154/84 | HR 68 | Temp 97.5°F | Resp 18 | Ht 69.0 in | Wt 178.6 lb

## 2018-03-13 DIAGNOSIS — C9190 Lymphoid leukemia, unspecified not having achieved remission: Secondary | ICD-10-CM

## 2018-03-13 DIAGNOSIS — D751 Secondary polycythemia: Secondary | ICD-10-CM | POA: Insufficient documentation

## 2018-03-13 DIAGNOSIS — C9110 Chronic lymphocytic leukemia of B-cell type not having achieved remission: Secondary | ICD-10-CM | POA: Insufficient documentation

## 2018-03-13 NOTE — Telephone Encounter (Signed)
Scheduled per 6/20 los - gave patient avs and calender  -

## 2018-03-13 NOTE — Progress Notes (Signed)
Hematology and Oncology Follow Up Visit  Corey Corey [name] [id number] October 26, 1940 77 y.o. 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  He has not required active treatment at this time.  Current therapy: Active surveillance.  Interim History:  Corey Corey [name] is here for a follow-up visit.  Since the last visit, he reports no major changes in his health.  He remains active and lives independently.  He still able to drive and reports no changes in his performance status or activity level.  He denies any painful adenopathy, weight loss or appetite changes.  He denies any excessive fatigue or tiredness.  He denies any early satiety or change in his bowel habits.  He does not report any headaches, blurry vision, double vision, syncope or seizure.  He denies any alteration in mental status.  He does not report any fevers, chills, sweats. He does not report any night sweats or fevers or chills. He does not report any chest pain, palpitation, orthopnea or leg edema. He does not report any cough, hemoptysis or hematemesis. He does not report any nausea, vomiting.  He denies any hematochezia or melena.  He does not report any frequency, urgency, hesitancy. He does not report any paralysis or myalgias.  He does not report any lymphadenopathy or petechiae. Remaining review of systems unremarkable  Medications: I have reviewed the patient's current medications.  Current Outpatient Medications  Medication Sig Dispense Refill  . allopurinol (ZYLOPRIM) 300 MG tablet Take 300 mg by mouth daily.    Marland Kitchen amLODipine (NORVASC) 5 MG tablet Take 5 mg by mouth daily.    Marland Kitchen omeprazole (PRILOSEC) 40 MG capsule Take 40 mg by mouth daily.    . pravastatin (PRAVACHOL) 40 MG tablet Take 20 mg by mouth daily.     . Tamsulosin HCl (FLOMAX) 0.4 MG CAPS Take 0.4 mg by mouth daily after supper.      No  current facility-administered medications for this visit.      Allergies: No Known Allergies  Past Medical History, Surgical history, Social history, and Family History were reviewed and updated.  Marland Kitchen Physical Exam: Blood pressure (!) 154/84, pulse Corey, temperature (!) 97.5 F (36.4 C), temperature source Oral, resp. rate 18, height 5\' 9"  (1.753 m), weight 178 lb 9.6 oz (81 kg), SpO2 98 %. ECOG: 0 General appearance: Well-appearing Corey [name] without distress. Head: Atraumatic without abnormalities Oropharynx: Without any thrush or ulcers Eyes: No scleral icterus. Lymph nodes: No lymphadenopathy noted in the cervical, supraclavicular, or axillary lymph nodes. Heart: Regular rate without any murmurs or gallops. Lung: clear, without any rhonchi, wheezes or dullness to percussion. Abdomin: Soft, without any rebound or guarding.  No shifting dullness or ascites. Musculoskeletal: No joint deformity or effusion.   Lab Results: Lab Results  Component Value Date   WBC 25.3 (H) 11/13/2016   HGB 18.5 (H) 11/13/2016   HCT 54.8 (H) 11/13/2016   MCV 95.6 11/13/2016   PLT 148 11/13/2016     Chemistry      Component Value Date/Time   NA 142 08/13/2016 0812   K 3.5 08/13/2016 0812   CL 108 06/22/2015 0450   CO2 26 08/13/2016 0812   BUN 5.9 (L) 08/13/2016 0812   CREATININE 1.0 08/13/2016 0812      Component Value Date/Time   CALCIUM 9.0 08/13/2016 0812   ALKPHOS Corey 08/13/2016 0812   AST 45 (H) 08/13/2016  5830   ALT 45 08/13/2016 0812   BILITOT 1.14 08/13/2016 0812        Impression and Plan:  Corey Corey [name] with the following issues:  1.  Stage 0 CLL diagnosed in 2017.  He presented with lymphocytosis and is asymptomatic.  He is a repeat laboratory testing obtained in June 2019 at his primary care physician was reviewed today and discussed with him.  His white cell count remains around 22,000 with platelet count of around 140.  At this time, I see no indication to start  active treatment for CLL.  Indication to start treatment were reviewed which include painful adenopathy, rapid rise in his white cell count, autoimmune cytopenias among others.  At this time, I see no indication to start treatment and I have recommended continued observation and surveillance.  2. Erythrocytosis: JAK2 mutation was not detected based on his labs in February 2018.  This appears to be reactive in nature with hemoglobin back to close to normal range.  3. Follow-up: We have discussed follow-up strategy that is feasible for him at this time.  He reports that he has laboratory testing every 3 months by his primary care physician which is more than adequate for this disease surveillance.  I have recommended at least annual follow-up from a hematology standpoint to ensure stability of this condition.  He is agreeable to this arrangement for the time being.  15  minutes was spent with the patient face-to-face today.  More than 50% of time was dedicated to patient counseling, education and coordination of  care.      Zola Button, MD 6/20/20192:59 PM

## 2019-02-12 ENCOUNTER — Telehealth: Payer: Self-pay | Admitting: Oncology

## 2019-02-12 NOTE — Telephone Encounter (Signed)
Call day 6/19 moved f/u to 6/24. Not able to reach patient by phone or leave message. Schedule mailed.

## 2019-03-13 ENCOUNTER — Ambulatory Visit: Payer: Medicare Other | Admitting: Oncology

## 2019-03-17 ENCOUNTER — Telehealth: Payer: Self-pay

## 2019-03-17 NOTE — Telephone Encounter (Signed)
Called patient to go over pre-screening questions for upcoming appt. Pt has no voicemail. Could not leave message.  

## 2019-03-18 ENCOUNTER — Inpatient Hospital Stay: Payer: Medicare Other | Attending: Oncology | Admitting: Oncology

## 2019-06-30 ENCOUNTER — Other Ambulatory Visit: Payer: Self-pay

## 2019-06-30 ENCOUNTER — Emergency Department (HOSPITAL_BASED_OUTPATIENT_CLINIC_OR_DEPARTMENT_OTHER)
Admission: EM | Admit: 2019-06-30 | Discharge: 2019-06-30 | Disposition: A | Discharge status: Home / Self Care | Payer: Medicare Other | Attending: Emergency Medicine | Admitting: Emergency Medicine

## 2019-06-30 ENCOUNTER — Encounter (HOSPITAL_BASED_OUTPATIENT_CLINIC_OR_DEPARTMENT_OTHER): Payer: Self-pay | Admitting: Emergency Medicine

## 2019-06-30 DIAGNOSIS — Z79899 Other long term (current) drug therapy: Secondary | ICD-10-CM | POA: Insufficient documentation

## 2019-06-30 DIAGNOSIS — E782 Mixed hyperlipidemia: Secondary | ICD-10-CM | POA: Diagnosis not present

## 2019-06-30 DIAGNOSIS — M545 Low back pain, unspecified: Secondary | ICD-10-CM

## 2019-06-30 DIAGNOSIS — I10 Essential (primary) hypertension: Secondary | ICD-10-CM | POA: Diagnosis not present

## 2019-06-30 MED ORDER — HYDROCODONE-ACETAMINOPHEN 5-325 MG PO TABS: 1.0000 | ORAL_TABLET | Freq: Four times a day (QID) | ORAL | 0 refills | Status: DC | PRN

## 2019-06-30 MED ORDER — METHYLPREDNISOLONE 4 MG PO TBPK: ORAL_TABLET | ORAL | 0 refills | Status: DC

## 2019-06-30 MED ORDER — TIZANIDINE HCL 2 MG PO TABS: 2.0000 mg | ORAL_TABLET | Freq: Four times a day (QID) | ORAL | 0 refills | Status: DC | PRN

## 2019-06-30 NOTE — Discharge Instructions (Signed)
1.  Start your Medrol Dosepak today.  This is a steroid therapy. It should start helping in 12-24 hours. 2.  You may take 1-2 Vicodin tablets for additional pain control as needed every 6 hours.  If you choose not to take the Vicodin, you may try extra strength Tylenol every 6 hours instead.  You could also alternate between Vicodin and Tylenol every 6 hours if this adequately controls pain. Do not combine Vicodin and Tylenol at the same time because they both contain acetaminophen and you could end up taking more than the recommended daily dose of acetaminophen (4 grams in 24 hours). 3.  If you feel that you are having muscle spasms, you may try the tizanidine as a muscle relaxer. 4.  Return to the emergency department if you develop weakness in the legs, numbness, loss of control of your bladder or bowel, pain not improved by your medications or other concerning symptoms. 5.  Follow-up with your family doctor to discuss further diagnostic evaluation including an MRI and referral to spine specialist.

## 2019-06-30 NOTE — ED Provider Notes (Signed)
[facility] EMERGENCY DEPARTMENT Provider Note   CSN: [id number] Arrival date & time: 06/30/19  1013     History   Chief Complaint Chief Complaint  Patient presents with  . Back Pain    HPI Corey [name] is a 78 y.o. male.     HPI Patient reports that he gets occasional flares of back pain but he has gotten a flare in the past 2 days it is severe.  He denies any activity that he thinks precipitated it.  He reports his pain is in his central back he indicates the lower to mid lumbar area.  He denies that it is radiating up or down.  He reports that it is improved by standing and walking.  He reports if he lies down or sits for a while, it is excruciatingly painful to get back up again.  He reports if he turns or bends the wrong way he will get a severe pain.  He reports he hardly got any sleep last night due to pain.  He is not having any abdominal pain.  No bladder or bowel pattern changes.  Patient reports he did have a flare this spring and saw his doctor.  He reports at that time he thought maybe he needed an MRI but symptoms did improve until this recent flareup.  Patient reports he is been trying variations between hot and cold compresses for pain control.  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  He reports he has never had a flare of gout since he is been on the allopurinol. Past Medical History:  Diagnosis Date  . Diverticulitis   . Gout   . Hypercholesteremia   . Hypertension     Patient Active Problem List   Diagnosis Date Noted  . Diverticulitis 06/20/2015  . Abdominal pain, epigastric 06/20/2015  . Sepsis ([place]) 06/20/2015  . Hypokalemia 06/20/2015  . Acute renal injury ([place]) 06/20/2015  . Benign essential HTN 06/20/2015  . Failure of outpatient treatment     Past Surgical History:  Procedure Laterality Date  . APPENDECTOMY          Home Medications    Prior to Admission medications   Medication Sig Start  Date End Date Taking? Authorizing Provider  allopurinol (ZYLOPRIM) 300 MG tablet Take 300 mg by mouth daily.    [provider]  amLODipine (NORVASC) 5 MG tablet Take 5 mg by mouth daily.    [provider]  HYDROcodone-acetaminophen (NORCO/VICODIN) 5-325 MG tablet Take 1-2 tablets by mouth every 6 (six) hours as needed for moderate pain or severe pain. 06/30/19   Charlesetta Shanks, MD  methylPREDNISolone (MEDROL DOSEPAK) 4 MG TBPK tablet Per pack instructions 06/30/19   Charlesetta Shanks, MD  omeprazole (PRILOSEC) 40 MG capsule Take 40 mg by mouth daily.    [provider]  pravastatin (PRAVACHOL) 40 MG tablet Take 20 mg by mouth daily.     [provider]  Tamsulosin HCl (FLOMAX) 0.4 MG CAPS Take 0.4 mg by mouth daily after supper.     [provider]  tiZANidine (ZANAFLEX) 2 MG tablet Take 1 tablet (2 mg total) by mouth every 6 (six) hours as needed for muscle spasms. 06/30/19   Charlesetta Shanks, MD    Family History No family history on file.  Social History Social History   Tobacco Use  . Smoking status: Never Smoker  . Smokeless tobacco: Never Used  Substance Use Topics  . Alcohol use:  Yes    Comment: occasional  . Drug use: No     Allergies   Patient has no known allergies.   Review of Systems Review of Systems 10 Systems reviewed and are negative for acute change except as noted in the HPI.   Physical Exam Updated Vital Signs BP 128/81 (BP Location: Right Arm)   Pulse 72   Temp 98.2 F (36.8 C) (Oral)   Resp 20   Ht 5\' 8"  (1.727 m)   Wt 81.6 kg   SpO2 99%   BMI 27.37 kg/m   Physical Exam Constitutional:      Comments: Patient is up and ambulating about the room as I come in.  He has a steady even gait.  Nontoxic.  Alert and clinically well in appearance.  HENT:     Head: Normocephalic and atraumatic.  Neck:     Musculoskeletal: Neck supple.  Cardiovascular:     Rate and Rhythm: Normal rate and regular rhythm.   Pulmonary:     Effort: Pulmonary effort is normal.     Breath sounds: Normal breath sounds.  Abdominal:     General: There is no distension.     Palpations: Abdomen is soft.     Tenderness: There is no abdominal tenderness. There is no guarding.  Musculoskeletal:     Comments: No reproducible pain to palpation over the bony prominences of the spine.  Patient can actually twist both legs relatively well without eliciting severe pain at this time.  He can sit and stand and ambulate in a coordinated fashion.  Lower extremities are in very good condition.  There is no peripheral edema present.  Skin condition is very good.  Normal hair growth on the lower legs.  Dorsalis pedis pulses are 2+ and strong.  Neurological:     General: No focal deficit present.     Mental Status: He is oriented to person, place, and time.     Coordination: Coordination normal.     Comments: Patient has 5\5 motor strength for flexion and extension of the lower extremities.  Sensation intact to light touch throughout the entirety of the lower extremities.  2+ and symmetric patellar reflexes.  Psychiatric:        Mood and Affect: Mood normal.      ED Treatments / Results  Labs (all labs ordered are listed, but only abnormal results are displayed) Labs Reviewed - No data to display  EKG None  Radiology No results found.  Procedures Procedures (including critical care time)  Medications Ordered in ED Medications - No data to display   Initial Impression / Assessment and Plan / ED Course  I have reviewed the triage vital signs and the nursing notes.  Pertinent labs & imaging results that were available during my care of the patient were reviewed by me and considered in my medical decision making (see chart for details).       Patient gets intermittent flares of back pain but typically manageable with conservative treatment.  Reports this is been a very severe episode.  Is keeping him awake at night.   Patient is fully functional in terms of neurologic function.  He has no associated abdominal pain or other symptoms to suggest intra-abdominal etiology.  He describes it clearly as being much more painful after he has been sitting or lying down for a while and having to transition positions.  At this time patient is appropriate for outpatient management with Medrol Dosepak, Vicodin as needed and  muscle relaxer as needed.  He is counseled on use of these medications.  He is counseled on close follow-up with his PCP.  Final Clinical Impressions(s) / ED Diagnoses   Final diagnoses:  Acute midline low back pain without sciatica    ED Discharge Orders         Ordered    methylPREDNISolone (MEDROL DOSEPAK) 4 MG TBPK tablet     06/30/19 1320    tiZANidine (ZANAFLEX) 2 MG tablet  Every 6 hours PRN     06/30/19 1320    HYDROcodone-acetaminophen (NORCO/VICODIN) 5-325 MG tablet  Every 6 hours PRN     06/30/19 1320           Charlesetta Shanks, MD 06/30/19 1333

## 2019-06-30 NOTE — ED Notes (Signed)
ED Provider at bedside. 

## 2019-06-30 NOTE — ED Notes (Signed)
Pt voices he would rather keep standing versus laying down because that aggravates his pain.

## 2019-06-30 NOTE — ED Triage Notes (Signed)
Low back pain x 3 days. Denies injury.

## 2019-07-21 ENCOUNTER — Other Ambulatory Visit: Payer: Self-pay

## 2019-07-21 ENCOUNTER — Ambulatory Visit
Admission: RE | Admit: 2019-07-21 | Discharge: 2019-07-21 | Disposition: A | Discharge status: Home / Self Care | Payer: Medicare Other | Source: Ambulatory Visit | Attending: Family Medicine | Admitting: Family Medicine

## 2019-07-21 ENCOUNTER — Other Ambulatory Visit: Payer: Self-pay | Admitting: Family Medicine

## 2019-07-21 DIAGNOSIS — M545 Low back pain, unspecified: Secondary | ICD-10-CM

## 2019-07-28 ENCOUNTER — Other Ambulatory Visit: Payer: Self-pay | Admitting: Family Medicine

## 2019-07-28 DIAGNOSIS — M5410 Radiculopathy, site unspecified: Secondary | ICD-10-CM

## 2019-07-28 DIAGNOSIS — M4316 Spondylolisthesis, lumbar region: Secondary | ICD-10-CM

## 2019-08-22 ENCOUNTER — Inpatient Hospital Stay: Admission: RE | Admit: 2019-08-22 | Payer: Medicare Other | Source: Ambulatory Visit

## 2019-08-29 ENCOUNTER — Ambulatory Visit
Admission: RE | Admit: 2019-08-29 | Discharge: 2019-08-29 | Disposition: A | Discharge status: Home / Self Care | Payer: Medicare Other | Source: Ambulatory Visit | Attending: Family Medicine | Admitting: Family Medicine

## 2019-08-29 ENCOUNTER — Other Ambulatory Visit: Payer: Self-pay

## 2019-08-29 DIAGNOSIS — M5410 Radiculopathy, site unspecified: Secondary | ICD-10-CM

## 2019-08-29 DIAGNOSIS — M4316 Spondylolisthesis, lumbar region: Secondary | ICD-10-CM

## 2019-09-06 ENCOUNTER — Emergency Department (HOSPITAL_BASED_OUTPATIENT_CLINIC_OR_DEPARTMENT_OTHER): Payer: Medicare Other

## 2019-09-06 ENCOUNTER — Emergency Department (HOSPITAL_BASED_OUTPATIENT_CLINIC_OR_DEPARTMENT_OTHER)
Admission: EM | Admit: 2019-09-06 | Discharge: 2019-09-06 | Disposition: A | Discharge status: Home / Self Care | Payer: Medicare Other | Attending: Emergency Medicine | Admitting: Emergency Medicine

## 2019-09-06 ENCOUNTER — Encounter (HOSPITAL_BASED_OUTPATIENT_CLINIC_OR_DEPARTMENT_OTHER): Payer: Self-pay | Admitting: Emergency Medicine

## 2019-09-06 ENCOUNTER — Other Ambulatory Visit: Payer: Self-pay

## 2019-09-06 DIAGNOSIS — Y999 Unspecified external cause status: Secondary | ICD-10-CM | POA: Diagnosis not present

## 2019-09-06 DIAGNOSIS — Z79899 Other long term (current) drug therapy: Secondary | ICD-10-CM | POA: Diagnosis not present

## 2019-09-06 DIAGNOSIS — E782 Mixed hyperlipidemia: Secondary | ICD-10-CM | POA: Insufficient documentation

## 2019-09-06 DIAGNOSIS — I10 Essential (primary) hypertension: Secondary | ICD-10-CM | POA: Diagnosis not present

## 2019-09-06 DIAGNOSIS — S22068A Other fracture of T7-T8 thoracic vertebra, initial encounter for closed fracture: Secondary | ICD-10-CM | POA: Insufficient documentation

## 2019-09-06 DIAGNOSIS — Y9301 Activity, walking, marching and hiking: Secondary | ICD-10-CM | POA: Diagnosis not present

## 2019-09-06 DIAGNOSIS — S22008A Other fracture of unspecified thoracic vertebra, initial encounter for closed fracture: Secondary | ICD-10-CM

## 2019-09-06 DIAGNOSIS — Y92008 Other place in unspecified non-institutional (private) residence as the place of occurrence of the external cause: Secondary | ICD-10-CM | POA: Insufficient documentation

## 2019-09-06 DIAGNOSIS — S22058A Other fracture of T5-T6 vertebra, initial encounter for closed fracture: Secondary | ICD-10-CM | POA: Diagnosis not present

## 2019-09-06 DIAGNOSIS — W01198A Fall on same level from slipping, tripping and stumbling with subsequent striking against other object, initial encounter: Secondary | ICD-10-CM | POA: Insufficient documentation

## 2019-09-06 DIAGNOSIS — S299XXA Unspecified injury of thorax, initial encounter: Secondary | ICD-10-CM | POA: Diagnosis present

## 2019-09-06 HISTORY — DX: Disorder of prostate, unspecified: N42.9

## 2019-09-06 MED ORDER — OXYCODONE-ACETAMINOPHEN 5-325 MG PO TABS: 1.0000 | ORAL_TABLET | Freq: Three times a day (TID) | ORAL | 0 refills | Status: AC | PRN

## 2019-09-06 MED ORDER — KETOROLAC TROMETHAMINE 60 MG/2ML IM SOLN
30.0000 mg | Freq: Once | INTRAMUSCULAR | Status: AC
Start: 2019-09-06 — End: 2019-09-06
  Administered 2019-09-06: 30 mg via INTRAMUSCULAR
  Filled 2019-09-06: qty 2

## 2019-09-06 MED ORDER — CYCLOBENZAPRINE HCL 5 MG PO TABS: 5.0000 mg | ORAL_TABLET | Freq: Two times a day (BID) | ORAL | 0 refills | Status: DC | PRN

## 2019-09-06 MED ORDER — ACETAMINOPHEN 325 MG PO TABS
650.0000 mg | ORAL_TABLET | Freq: Once | ORAL | Status: AC
  Administered 2019-09-06: 650 mg via ORAL
  Filled 2019-09-06: qty 2

## 2019-09-06 NOTE — Discharge Instructions (Signed)
Recommend taking prescribed pain medicine as needed for pain control.  Can also take the prescribed muscle relaxer as needed for symptom control.  Note these medicines can make you drowsy and should not be taken while driving or operating heavy machinery.  Recommend following up with your neurosurgeon.  You can also call the number provided.  You should be evaluated within the next week for your injuries.  If you develop any numbness, weakness, worsening pain, please return to ER.

## 2019-09-06 NOTE — ED Provider Notes (Signed)
[facility] EMERGENCY DEPARTMENT Provider Note   CSN: [id number] Arrival date & time: 09/06/19  1415     History Chief Complaint  Patient presents with  . Back Pain    Corey [name] is a 78 y.o. male.  Presented our chief complaint back pain.  Patient states he was going down his hall to go to the bathroom when he slipped and fell onto door frame, causing trauma to his mid back.  States he has been having moderate to severe back pain ever since.  Pain worse with certain movements, has had some pain with ambulation but is able to walk.  No numbness or weakness in his extremities, no bladder or bowel incontinence.  Patient states a week ago, prior to this happening he had an MRI and was referred to a neurosurgeon for lumbar low spine pain.  States this is completely unrelated to the pain that he experienced after the fall.  Has not had any imaging of his back since the fall.  No head trauma, no LOC, no chest pain, no abdominal pain.  HPI     Past Medical History:  Diagnosis Date  . Diverticulitis   . Gout   . Hypercholesteremia   . Hypertension   . Prostate disorder     Patient Active Problem List   Diagnosis Date Noted  . Diverticulitis 06/20/2015  . Abdominal pain, epigastric 06/20/2015  . Sepsis ([place]) 06/20/2015  . Hypokalemia 06/20/2015  . Acute renal injury ([place]) 06/20/2015  . Benign essential HTN 06/20/2015  . Failure of outpatient treatment     Past Surgical History:  Procedure Laterality Date  . APPENDECTOMY         No family history on file.  Social History   Tobacco Use  . Smoking status: Never Smoker  . Smokeless tobacco: Never Used  Substance Use Topics  . Alcohol use: Yes    Comment: occasional  . Drug use: No    Home Medications Prior to Admission medications   Medication Sig Start Date End Date Taking? Authorizing Provider  allopurinol (ZYLOPRIM) 300 MG tablet Take 300 mg by mouth daily.    [provider]  amLODipine  (NORVASC) 5 MG tablet Take 5 mg by mouth daily.    [provider]  cyclobenzaprine (FLEXERIL) 5 MG tablet Take 1 tablet (5 mg total) by mouth 2 (two) times daily as needed for muscle spasms. 09/06/19   Lucrezia Starch, MD  HYDROcodone-acetaminophen (NORCO/VICODIN) 5-325 MG tablet Take 1-2 tablets by mouth every 6 (six) hours as needed for moderate pain or severe pain. 06/30/19   Charlesetta Shanks, MD  methylPREDNISolone (MEDROL DOSEPAK) 4 MG TBPK tablet Per pack instructions 06/30/19   Charlesetta Shanks, MD  omeprazole (PRILOSEC) 40 MG capsule Take 40 mg by mouth daily.    [provider]  oxyCODONE-acetaminophen (PERCOCET) 5-325 MG tablet Take 1 tablet by mouth every 8 (eight) hours as needed for up to 3 days for severe pain. 09/06/19 09/09/19  Lucrezia Starch, MD  pravastatin (PRAVACHOL) 40 MG tablet Take 20 mg by mouth daily.     [provider]  Tamsulosin HCl (FLOMAX) 0.4 MG CAPS Take 0.4 mg by mouth daily after supper.     [provider]  tiZANidine (ZANAFLEX) 2 MG tablet Take 1 tablet (2 mg total) by mouth every 6 (six) hours as needed for muscle spasms. 06/30/19   Charlesetta Shanks, MD    Allergies    Patient has no known allergies.  Review of Systems   Review of Systems  Constitutional: Negative for chills and fever.  HENT: Negative for ear pain and sore throat.   Eyes: Negative for pain and visual disturbance.  Respiratory: Negative for cough and shortness of breath.   Cardiovascular: Negative for chest pain and palpitations.  Gastrointestinal: Negative for abdominal pain and vomiting.  Genitourinary: Negative for dysuria and hematuria.  Musculoskeletal: Positive for back pain. Negative for arthralgias.  Skin: Negative for color change and rash.  Neurological: Negative for seizures and syncope.  All other systems reviewed and are negative.   Physical Exam Updated Vital Signs BP (!) 154/82 (BP Location: Right Arm)   Pulse (!) 58   Temp 98.1  F (36.7 C) (Oral)   Resp 16   Ht 5\' 9"  (1.753 m)   Wt 73 kg   SpO2 97%   BMI 23.78 kg/m   Physical Exam Vitals and nursing note reviewed.  Constitutional:      Appearance: He is well-developed.  HENT:     Head: Normocephalic and atraumatic.  Eyes:     Conjunctiva/sclera: Conjunctivae normal.  Cardiovascular:     Rate and Rhythm: Normal rate and regular rhythm.     Heart sounds: No murmur.  Pulmonary:     Effort: Pulmonary effort is normal. No respiratory distress.     Breath sounds: Normal breath sounds.  Abdominal:     Palpations: Abdomen is soft.     Tenderness: There is no abdominal tenderness.  Musculoskeletal:     Cervical back: Neck supple.     Comments: Back: Tenderness palpation over mid to lower T-spine, upper L-spine, no obvious deformity, no step-offs Extremities: No tenderness palpation throughout all 4 extremities, normal joint range of motion in all 4 extremities  Skin:    General: Skin is warm and dry.  Neurological:     General: No focal deficit present.     Mental Status: He is alert and oriented to person, place, and time.     Comments: 5 out of 5 strength in bilateral lower extremities, sensation to light touch intact in lower extremities  Psychiatric:        Mood and Affect: Mood normal.        Behavior: Behavior normal.     ED Results / Procedures / Treatments   Labs (all labs ordered are listed, but only abnormal results are displayed) Labs Reviewed - No data to display  EKG None  Radiology DG Ribs Unilateral W/Chest Right  Result Date: 09/06/2019 CLINICAL DATA:  Right rib pain after fall. EXAM: RIGHT RIBS AND CHEST - 3+ VIEW COMPARISON:  April 15, 2019. FINDINGS: No fracture or other bone lesions are seen involving the ribs. There is no evidence of pneumothorax or pleural effusion. Both lungs are clear. Heart size and mediastinal contours are within normal limits. IMPRESSION: Negative. Electronically Signed   By: Marijo Conception M.D.   On:  09/06/2019 16:36   CT Thoracic Spine Wo Contrast  Result Date: 09/06/2019 CLINICAL DATA:  Back pain secondary to a fall 5 days ago. EXAM: CT THORACIC AND LUMBAR SPINE WITHOUT CONTRAST TECHNIQUE: Multidetector CT imaging of the thoracic and lumbar spine was performed without contrast. Multiplanar CT image reconstructions were also generated. COMPARISON:  Lumbar MRI dated 08/29/2019 and lumbar radiographs dated 07/21/2019 FINDINGS: CT THORACIC SPINE FINDINGS Alignment: Normal. Vertebrae: There are fractures of the spinous processes of T6, T7, and T8. The other bones of the thoracic spine are intact. Osteophytes fuse the thoracic  spine from T4 to T7 and at T8-9. Paraspinal and other soft tissues: There is a 3 mm nodule in the left upper lobe adjacent to the major fissure on image 36 of series 4. aortic atherosclerosis. Coronary artery calcifications. Disc levels: No appreciable disc bulges or protrusions. No significant foraminal or spinal stenoses. CT LUMBAR SPINE FINDINGS Segmentation: 5 lumbar type vertebrae. Alignment: Normal. Vertebrae: No fractures. Prominent Schmorl's node in the inferior endplate of L1. Paraspinal and other soft tissues: Aortic atherosclerosis. Sigmoid diverticulosis. Disc levels: L1-2: Spirals node in the inferior endplate of L1. Tiny broad-based disc bulge with no neural impingement. L2-3: Tiny broad-based disc bulge with no neural impingement. L3-4: Tiny broad-based disc bulge with no neural impingement. L4-5: Small broad-based disc bulge slightly compressing the thecal sac symmetrically, unchanged since the prior MRI. L5-S1: No significant abnormality of the disc. Slight bilateral facet arthritis, right greater than left. IMPRESSION: CT THORACIC SPINE IMPRESSION Fractures of the spinous processes of T6, T7 and T8. Otherwise negative CT scan of the thoracic spine. CT LUMBAR SPINE IMPRESSION No acute abnormalities of the lumbar spine. Multilevel slight degenerative disc disease as  described, unchanged since 08/29/2019. Electronically Signed   By: Lorriane Shire M.D.   On: 09/06/2019 17:07   CT Lumbar Spine Wo Contrast  Result Date: 09/06/2019 CLINICAL DATA:  Back pain secondary to a fall 5 days ago. EXAM: CT THORACIC AND LUMBAR SPINE WITHOUT CONTRAST TECHNIQUE: Multidetector CT imaging of the thoracic and lumbar spine was performed without contrast. Multiplanar CT image reconstructions were also generated. COMPARISON:  Lumbar MRI dated 08/29/2019 and lumbar radiographs dated 07/21/2019 FINDINGS: CT THORACIC SPINE FINDINGS Alignment: Normal. Vertebrae: There are fractures of the spinous processes of T6, T7, and T8. The other bones of the thoracic spine are intact. Osteophytes fuse the thoracic spine from T4 to T7 and at T8-9. Paraspinal and other soft tissues: There is a 3 mm nodule in the left upper lobe adjacent to the major fissure on image 36 of series 4. aortic atherosclerosis. Coronary artery calcifications. Disc levels: No appreciable disc bulges or protrusions. No significant foraminal or spinal stenoses. CT LUMBAR SPINE FINDINGS Segmentation: 5 lumbar type vertebrae. Alignment: Normal. Vertebrae: No fractures. Prominent Schmorl's node in the inferior endplate of L1. Paraspinal and other soft tissues: Aortic atherosclerosis. Sigmoid diverticulosis. Disc levels: L1-2: Spirals node in the inferior endplate of L1. Tiny broad-based disc bulge with no neural impingement. L2-3: Tiny broad-based disc bulge with no neural impingement. L3-4: Tiny broad-based disc bulge with no neural impingement. L4-5: Small broad-based disc bulge slightly compressing the thecal sac symmetrically, unchanged since the prior MRI. L5-S1: No significant abnormality of the disc. Slight bilateral facet arthritis, right greater than left. IMPRESSION: CT THORACIC SPINE IMPRESSION Fractures of the spinous processes of T6, T7 and T8. Otherwise negative CT scan of the thoracic spine. CT LUMBAR SPINE IMPRESSION No  acute abnormalities of the lumbar spine. Multilevel slight degenerative disc disease as described, unchanged since 08/29/2019. Electronically Signed   By: Lorriane Shire M.D.   On: 09/06/2019 17:07    Procedures Procedures (including critical care time)  Medications Ordered in ED Medications  acetaminophen (TYLENOL) tablet 650 mg (650 mg Oral Given 09/06/19 1603)  ketorolac (TORADOL) injection 30 mg (30 mg Intramuscular Given 09/06/19 1603)    ED Course  I have reviewed the triage vital signs and the nursing notes.  Pertinent labs & imaging results that were available during my care of the patient were reviewed by me and considered in  my medical decision making (see chart for details).    MDM Rules/Calculators/A&P                      78 year old male presents to ER with mid back pain after reported injury 5 days ago.  Here patient noted to be well-appearing, stable vital signs, physical exam concerning for some tenderness over his lower T, upper L-spine.  No other traumatic findings on exam or complaints.  CT thoracic and lumbar spine completed, concerning for spinous process fracture T6-T8.  Otherwise negative.  Pain well controlled, patient ambulate without difficulty.  No neuro complaint or deficit.  Provided information for spine surgery follow-up.  Will DC home.  Provided Rx for muscle relaxer, short Rx for Percocet for pain control.    After the discussed management above, the patient was determined to be safe for discharge.  The patient was in agreement with this plan and all questions regarding their care were answered.  ED return precautions were discussed and the patient will return to the ED with any significant worsening of condition.      Final Clinical Impression(s) / ED Diagnoses Final diagnoses:  Closed fracture of spinous process of thoracic vertebra, initial encounter [facility])    Rx / DC Orders ED Discharge Orders         Ordered    cyclobenzaprine (FLEXERIL) 5  MG tablet  2 times daily PRN     09/06/19 1814    oxyCODONE-acetaminophen (PERCOCET) 5-325 MG tablet  Every 8 hours PRN     09/06/19 1814           Lucrezia Starch, MD 09/07/19 1338

## 2019-09-06 NOTE — ED Triage Notes (Signed)
Pt c/o mid back injury x 5 days. Pt was running down the hall to get to the bathroom. Slipped and fell into the door frame.

## 2019-11-19 ENCOUNTER — Ambulatory Visit: Payer: Medicare Other | Attending: Internal Medicine

## 2019-11-19 DIAGNOSIS — Z23 Encounter for immunization: Secondary | ICD-10-CM | POA: Insufficient documentation

## 2019-11-19 NOTE — Progress Notes (Signed)
   Covid-19 Vaccination Clinic  Name:  Corey [name]    MRN: [id number] DOB: 1940-12-28  11/19/2019  Corey [name] was observed post Covid-19 immunization for 15 minutes without incidence. He was provided with Vaccine Information Sheet and instruction to access the V-Safe system.   Corey [name] was instructed to call 911 with any severe reactions post vaccine: Marland Kitchen Difficulty breathing  . Swelling of your face and throat  . A fast heartbeat  . A bad rash all over your body  . Dizziness and weakness    Immunizations Administered    Name Date Dose VIS Date Route   Pfizer COVID-19 Vaccine 11/19/2019  1:34 PM 0.3 mL 09/04/2019 Intramuscular   Manufacturer: [place]   Lot: [id number]   [place]: [id number]

## 2019-12-15 ENCOUNTER — Ambulatory Visit: Payer: Medicare Other | Attending: Internal Medicine

## 2019-12-15 DIAGNOSIS — Z23 Encounter for immunization: Secondary | ICD-10-CM

## 2019-12-15 NOTE — Progress Notes (Signed)
   Covid-19 Vaccination Clinic  Name:  Corey [name]    MRN: [id number] DOB: June 20, 1941  12/15/2019  [name] was observed post Covid-19 immunization for 15 minutes without incident. He was provided with Vaccine Information Sheet and instruction to access the V-Safe system.   [name] was instructed to call 911 with any severe reactions post vaccine: Marland Kitchen Difficulty breathing  . Swelling of face and throat  . A fast heartbeat  . A bad rash all over body  . Dizziness and weakness   Immunizations Administered    Name Date Dose VIS Date Route   Pfizer COVID-19 Vaccine 12/15/2019  1:15 PM 0.3 mL 09/04/2019 Intramuscular   Manufacturer: [place]   Lot: [id number]   [place]: [id number]      Covid-19 Vaccination Clinic  Name:  Corey [name]    MRN: [id number] DOB: 1941/07/11  12/15/2019  [name] was observed post Covid-19 immunization for 15 minutes without incident. He was provided with Vaccine Information Sheet and instruction to access the V-Safe system.   [name] was instructed to call 911 with any severe reactions post vaccine: Marland Kitchen Difficulty breathing  . Swelling of face and throat  . A fast heartbeat  . A bad rash all over body  . Dizziness and weakness   Immunizations Administered    Name Date Dose VIS Date Route   Pfizer COVID-19 Vaccine 12/15/2019  1:15 PM 0.3 mL 09/04/2019 Intramuscular   Manufacturer: [place]   Lot: [id number]   [place]: [id number]

## 2020-03-17 ENCOUNTER — Telehealth: Payer: Self-pay | Admitting: Hematology

## 2020-03-17 NOTE — Telephone Encounter (Signed)
Received a new pt referral from [facility] for [facility]. Corey [name] is an established pt of Dr. Alen Blew, but wanted to change providers. He has been cld and scheduled to see Corey [name] on 7/6 at 1pm. Pt aware to arrive 15 minutes early.

## 2020-03-25 ENCOUNTER — Telehealth: Payer: Self-pay | Admitting: Hematology

## 2020-03-25 NOTE — Telephone Encounter (Signed)
Corey [name] has been cld and rescheduled to see Dr. Irene Limbo to 7/13 at 2:40pm. Pt aware to arrive 15 minutes early.

## 2020-03-29 ENCOUNTER — Inpatient Hospital Stay: Payer: Medicare Other | Admitting: Hematology

## 2020-03-29 ENCOUNTER — Inpatient Hospital Stay: Payer: Medicare Other

## 2020-04-05 ENCOUNTER — Telehealth: Payer: Self-pay | Admitting: Hematology

## 2020-04-05 ENCOUNTER — Inpatient Hospital Stay: Payer: Medicare Other

## 2020-04-05 ENCOUNTER — Inpatient Hospital Stay: Payer: Medicare Other | Attending: Hematology | Admitting: Hematology

## 2020-04-05 ENCOUNTER — Other Ambulatory Visit: Payer: Self-pay

## 2020-04-05 VITALS — BP 159/80 | HR 65 | Temp 97.9°F | Resp 17 | Ht 69.0 in | Wt 162.7 lb

## 2020-04-05 DIAGNOSIS — C9110 Chronic lymphocytic leukemia of B-cell type not having achieved remission: Secondary | ICD-10-CM

## 2020-04-05 DIAGNOSIS — E538 Deficiency of other specified B group vitamins: Secondary | ICD-10-CM | POA: Insufficient documentation

## 2020-04-05 DIAGNOSIS — Z79899 Other long term (current) drug therapy: Secondary | ICD-10-CM | POA: Insufficient documentation

## 2020-04-05 DIAGNOSIS — D751 Secondary polycythemia: Secondary | ICD-10-CM

## 2020-04-05 LAB — CMP (CANCER CENTER ONLY)
ALT: 15 U/L (ref 0–44)
AST: 17 U/L (ref 15–41)
Albumin: 4.1 g/dL (ref 3.5–5.0)
Alkaline Phosphatase: 65 U/L (ref 38–126)
Anion gap: 12 (ref 5–15)
BUN: 8 mg/dL (ref 8–23)
CO2: 27 mmol/L (ref 22–32)
Calcium: 9.3 mg/dL (ref 8.9–10.3)
Chloride: 105 mmol/L (ref 98–111)
Creatinine: 1.03 mg/dL (ref 0.61–1.24)
GFR, Est AFR Am: >60 mL/min (ref >60)
GFR, Estimated: >60 mL/min (ref >60)
Glucose, Bld: 96 mg/dL (ref 70–99)
Potassium: 3.5 mmol/L (ref 3.5–5.1)
Sodium: 144 mmol/L (ref 135–145)
Total Bilirubin: 1.2 mg/dL (ref 0.3–1.2)
Total Protein: 7.1 g/dL (ref 6.5–8.1)

## 2020-04-05 LAB — CBC WITH DIFFERENTIAL/PLATELET
Abs Immature Granulocytes: 0.05 10*3/uL (ref 0.00–0.07)
Basophils Absolute: 0.1 10*3/uL (ref 0.0–0.1)
Basophils Relative: 0 %
Eosinophils Absolute: 0 10*3/uL (ref 0.0–0.5)
Eosinophils Relative: 0 %
HCT: 56.6 % — ABNORMAL HIGH (ref 39.0–52.0)
Hemoglobin: 18.5 g/dL — ABNORMAL HIGH (ref 13.0–17.0)
Immature Granulocytes: 0 %
Lymphocytes Relative: 88 %
Lymphs Abs: 26.7 10*3/uL — ABNORMAL HIGH (ref 0.7–4.0)
MCH: 30.7 pg (ref 26.0–34.0)
MCHC: 32.7 g/dL (ref 30.0–36.0)
MCV: 94 fL (ref 80.0–100.0)
Monocytes Absolute: 0.6 10*3/uL (ref 0.1–1.0)
Monocytes Relative: 2 %
Neutro Abs: 3.2 10*3/uL (ref 1.7–7.7)
Neutrophils Relative %: 10 %
Platelets: 144 10*3/uL — ABNORMAL LOW (ref 150–400)
RBC: 6.02 MIL/uL — ABNORMAL HIGH (ref 4.22–5.81)
RDW: 14.4 % (ref 11.5–15.5)
WBC: 30.7 10*3/uL — ABNORMAL HIGH (ref 4.0–10.5)
nRBC: 0 % (ref 0.0–0.2)

## 2020-04-05 LAB — VITAMIN B12: Vitamin B-12: 390 pg/mL (ref 180–914)

## 2020-04-05 LAB — LACTATE DEHYDROGENASE: LDH: 149 U/L (ref 98–192)

## 2020-04-05 NOTE — Telephone Encounter (Signed)
Scheduled appt per 7/13 los - gave patient AVS and calender per los.,

## 2020-04-05 NOTE — Progress Notes (Signed)
HEMATOLOGY/ONCOLOGY CONSULTATION NOTE  Date of Service: 04/05/2020  Patient Care Team: Christain Sacramento, MD as PCP - General (Family Medicine)  CHIEF COMPLAINTS/PURPOSE OF CONSULTATION:  CLL  HISTORY OF PRESENTING ILLNESS:   Corey [name] is a wonderful 79 y.o. 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 The pt reports that he is doing well overall.   The pt reports that he has recently had prostatitis and his blood pressure has been abnormally high. Pt has previously been Vitamin B12 deficient, but began taking a Vitamin B12 supplement at the recommendation of Bing Matter PA-C. He also takes Centrum daily multivitamin.  Pt has been on Omeprazole for 10+ years. Pt notes that he has lost nearly 20 lbs over the course of the last year or so due to eating less because of the pandemic. Pt lives alone and is able to take care of his needs without issue. He lost his mother within the last month. This has contributed greatly to his stress. Pt denies any consitutional symptoms or any new concerns.   Most recent lab results (02/17/2020) of CBC w/diff is as follows: all values are WNL except for WBC at 30.2K, HCT at 52.1, PLT at 124K, Lymphs Abs at 26.0K. 02/17/2020 Vitamin B12 at 315  On review of systems, pt reports stress and denies fatigue, rash, fevers, chills, night sweats, rapid weight loss, new lumps/bumps, abnormal bruising/bleeding and any other symptoms.   On PMHx the pt reports Prostatitis, HTN, HLD, Appendectomy, CLL On Social Hx the pt reports that he is a non-smoker.    MEDICAL HISTORY:  Past Medical History:  Diagnosis Date  . Diverticulitis   . Gout   . Hypercholesteremia   . Hypertension   . Prostate disorder   . Acute renal injury ([place]) 06/20/2015  . CLL (chronic lymphocytic leukemia) ([place])   SURGICAL HISTORY: Past Surgical History:  Procedure Laterality Date  . APPENDECTOMY      SOCIAL HISTORY: Social  History   Socioeconomic History  . Marital status: Divorced    Spouse name: Not on file  . Number of children: Not on file  . Years of education: Not on file  . Highest education level: Not on file  Occupational History  . Not on file  Tobacco Use  . Smoking status: Never Smoker  . Smokeless tobacco: Never Used  Vaping Use  . Vaping Use: Never used  Substance and Sexual Activity  . Alcohol use: Yes    Comment: occasional  . Drug use: No  . Sexual activity: Not on file  Other Topics Concern  . Not on file  Social History Narrative  . Not on file   Social Determinants of Health   Financial Resource Strain:   . Difficulty of Paying Living Expenses:   Food Insecurity:   . Worried About Charity fundraiser in the Last Year:   . Arboriculturist in the Last Year:   Transportation Needs:   . Film/video editor (Medical):   Marland Kitchen Lack of Transportation (Non-Medical):   Physical Activity:   . Days of Exercise per Week:   . Minutes of Exercise per Session:   Stress:   . Feeling of Stress :   Social Connections:   . Frequency of Communication with Friends and Family:   . Frequency of Social Gatherings with Friends and Family:   . Attends Religious Services:   . Active Member of Clubs  or Organizations:   . Attends Archivist Meetings:   Marland Kitchen Marital Status:   Intimate Partner Violence:   . Fear of Current or Ex-Partner:   . Emotionally Abused:   Marland Kitchen Physically Abused:   . Sexually Abused:     FAMILY HISTORY: No family history on file.  ALLERGIES:  has No Known Allergies.  MEDICATIONS:  Current Outpatient Medications  Medication Sig Dispense Refill  . allopurinol (ZYLOPRIM) 300 MG tablet Take 300 mg by mouth daily.    Marland Kitchen amLODipine (NORVASC) 5 MG tablet Take 5 mg by mouth daily.    . cyclobenzaprine (FLEXERIL) 5 MG tablet Take 1 tablet (5 mg total) by mouth 2 (two) times daily as needed for muscle spasms. 30 tablet 0  . HYDROcodone-acetaminophen (NORCO/VICODIN)  5-325 MG tablet Take 1-2 tablets by mouth every 6 (six) hours as needed for moderate pain or severe pain. 20 tablet 0  . methylPREDNISolone (MEDROL DOSEPAK) 4 MG TBPK tablet Per pack instructions 21 tablet 0  . omeprazole (PRILOSEC) 40 MG capsule Take 40 mg by mouth daily.    . pravastatin (PRAVACHOL) 40 MG tablet Take 20 mg by mouth daily.     . Tamsulosin HCl (FLOMAX) 0.4 MG CAPS Take 0.4 mg by mouth daily after supper.     Marland Kitchen tiZANidine (ZANAFLEX) 2 MG tablet Take 1 tablet (2 mg total) by mouth every 6 (six) hours as needed for muscle spasms. 30 tablet 0   No current facility-administered medications for this visit.    REVIEW OF SYSTEMS:    10 Point review of Systems was done is negative except as noted above.  PHYSICAL EXAMINATION: ECOG PERFORMANCE STATUS: 0 - Asymptomatic  . Vitals:   04/05/20 1437  BP: (!) 159/80  Pulse: 65  Resp: 17  Temp: 97.9 F (36.6 C)  SpO2: 97%   Filed Weights   04/05/20 1437  Weight: 162 lb 11.2 oz (73.8 kg)   .Body mass index is 24.03 kg/m.   GENERAL:alert, in no acute distress and comfortable SKIN: no acute rashes, no significant lesions EYES: conjunctiva are pink and non-injected, sclera anicteric OROPHARYNX: MMM, no exudates, no oropharyngeal erythema or ulceration NECK: supple, no JVD LYMPH:  no palpable lymphadenopathy in the axillary or inguinal regions. Few, small cervical lymph nodes. LUNGS: clear to auscultation b/l with normal respiratory effort HEART: regular rate & rhythm ABDOMEN:  normoactive bowel sounds , non tender, not distended. Extremity: no pedal edema PSYCH: alert & oriented x 3 with fluent speech NEURO: no focal motor/sensory deficits  LABORATORY DATA:  I have reviewed the data as listed  . CBC Latest Ref Rng & Units 04/05/2020 11/13/2016 08/13/2016  WBC 4.0 - 10.5 K/uL 30.7(H) 25.3(H) 25.0(H)  Hemoglobin 13.0 - 17.0 g/dL 18.5(H) 18.5(H) 19.2(H)  Hematocrit 39 - 52 % 56.6(H) 54.8(H) 55.5(H)  Platelets 150 - 400  K/uL 144(L) 148 136(L)    . CMP Latest Ref Rng & Units 04/05/2020 08/13/2016 06/22/2015  Glucose 70 - 99 mg/dL 96 117 87  BUN 8 - 23 mg/dL 8 5.9(L) 11  Creatinine 0.61 - 1.24 mg/dL 1.03 1.0 1.14  Sodium 135 - 145 mmol/L 144 142 141  Potassium 3.5 - 5.1 mmol/L 3.5 3.5 4.0  Chloride 98 - 111 mmol/L 105 - 108  CO2 22 - 32 mmol/L 27 26 25   Calcium 8.9 - 10.3 mg/dL 9.3 9.0 8.6(L)  Total Protein 6.5 - 8.1 g/dL 7.1 6.6 -  Total Bilirubin 0.3 - 1.2 mg/dL 1.2 1.14 -  Alkaline  Phos 38 - 126 U/L 65 66 -  AST 15 - 41 U/L 17 45(H) -  ALT 0 - 44 U/L 15 45 -           RADIOGRAPHIC STUDIES: I have personally reviewed the radiological images as listed and agreed with the findings in the report. No results found.  ASSESSMENT & PLAN:   79 yo with   1) Stage 0 Chronic lymphocytic Leukemia  No overt signs of symptomatic disease progression at this time. PLAN: -Discussed patient's most recent labs from 02/17/2020, all values are WNL except for WBC at 30.2K, HCT at 52.1, PLT at 124K, Lymphs Abs at 26.0K. -Discussed 02/17/2020 Vitamin B12 at 315 -Discussed that pt's WBC has been stable over the last 5 years. -Advised pt that CLL is considered an slow moving disease and often takes years to become bothersome. -Advised pt that CLL can often cause symptoms including: fatigue, excessive bruising or bleeding, night sweats, loss of appetite, and unexpected weight loss.  -Advised pt that we do not treat CLL unless causing: cytopenias, bulky disease, threatened end organs, or constitutional symptoms.  -Advised pt that studies have shown that treating CLL prematurely does not lead to better health outcomes.  -Plan to watch pt with labs and clinic visits every 12 months, provided no high-risk genetics. Will alternate visits with PCP.   -Advised pt that CLL can increase risk of non-melanoma skin cancers. Recommend pt complete regular screenings with a Dermatologist.  -Advised pt that CLL or chronic  Omeprazole use could be causing to his Vitamin B12 deficiency.  -Recommend pt stay up-to-date with age-appropriate vaccinations. -Continue SL Vitamin B12  -Will get labs today - including CLL FISH mutation profile. -Will see back in 12 months with labs per patients preference to monitor labs with PCP  3) B12 deficiency -continue SL B12 -will check anti IF and anti Parietal cell antibiotics to r/o pernicious anemia.  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 Plan -encouraged patient to drink atleast 48-60oz of water daily -consider w/u for secondary causes of polycythemia with PCP -- would recommend PFTs to r/o chronic lung disease and sleep study to r/o sleep apnea.   FOLLOW UP: Labs today RTC with Dr Irene Limbo with labs in 12 months   All of the patients questions were answered with apparent satisfaction. The patient knows to call the clinic with any problems, questions or concerns.  I spent 31mins counseling the patient face to face. The total time spent in the appointment was 40 minutes and more than 50% was on counseling and direct patient cares.    Sullivan Lone MD [place] AAHIVMS [facility] Hematology/Oncology Physician [facility]  (Office):       [phone] (Work cell):  [phone] (Fax):           [phone]  04/05/2020 3:42 PM  I, Yevette Edwards, am acting as a scribe for Dr. Sullivan Lone.   .I have reviewed the above documentation for accuracy and completeness, and I agree with the above.  Brunetta Genera MD

## 2020-04-06 LAB — INTRINSIC FACTOR ANTIBODIES: Intrinsic Factor: 1 AU/mL (ref 0.0–1.1)

## 2020-04-06 LAB — IGG: IgG (Immunoglobin G), Serum: 666 mg/dL (ref 603–1613)

## 2020-04-06 LAB — ANTI-PARIETAL ANTIBODY: Parietal Cell Antibody-IgG: 0.9 Units (ref 0.0–20.0)

## 2020-04-11 ENCOUNTER — Telehealth: Payer: Self-pay | Admitting: *Deleted

## 2020-04-11 NOTE — Telephone Encounter (Signed)
Patient called with several questions after 7/13 appt with Dr. Irene Limbo: 1)Does he need blood work every month? Per Dr. Grier Mitts OV note: "Plan to watch pt with labs and clinic visits every 12 months, provided no high-risk genetics. Will alternate visits with PCP". Patient states he has appt with Dr. Redmond Pulling in November. 2) When is next appt with Dr. Irene Limbo? Appointment with Dr. Irene Limbo for 04/05/2021 has been scheduled for patient. 3)Will Dr. Irene Limbo send copy of office visit notes and all results to Dr. Redmond Pulling? Informed him that Dr. Irene Limbo will be given his request. Patient verbalized understanding of all information. Encouraged him to contact office for further questions.

## 2020-04-13 LAB — FISH,CLL PROGNOSTIC PANEL

## 2020-05-23 ENCOUNTER — Other Ambulatory Visit: Payer: Self-pay

## 2020-05-23 ENCOUNTER — Emergency Department (HOSPITAL_BASED_OUTPATIENT_CLINIC_OR_DEPARTMENT_OTHER)
Admission: EM | Admit: 2020-05-23 | Discharge: 2020-05-23 | Disposition: A | Discharge status: Home / Self Care | Payer: Medicare Other | Attending: Emergency Medicine | Admitting: Emergency Medicine

## 2020-05-23 ENCOUNTER — Emergency Department (HOSPITAL_BASED_OUTPATIENT_CLINIC_OR_DEPARTMENT_OTHER): Payer: Medicare Other

## 2020-05-23 ENCOUNTER — Encounter (HOSPITAL_BASED_OUTPATIENT_CLINIC_OR_DEPARTMENT_OTHER): Payer: Self-pay | Admitting: *Deleted

## 2020-05-23 DIAGNOSIS — Z79899 Other long term (current) drug therapy: Secondary | ICD-10-CM | POA: Insufficient documentation

## 2020-05-23 DIAGNOSIS — K5732 Diverticulitis of large intestine without perforation or abscess without bleeding: Secondary | ICD-10-CM | POA: Diagnosis not present

## 2020-05-23 DIAGNOSIS — K5900 Constipation, unspecified: Secondary | ICD-10-CM | POA: Diagnosis not present

## 2020-05-23 DIAGNOSIS — I10 Essential (primary) hypertension: Secondary | ICD-10-CM | POA: Insufficient documentation

## 2020-05-23 DIAGNOSIS — R1031 Right lower quadrant pain: Secondary | ICD-10-CM | POA: Diagnosis present

## 2020-05-23 DIAGNOSIS — K5792 Diverticulitis of intestine, part unspecified, without perforation or abscess without bleeding: Secondary | ICD-10-CM

## 2020-05-23 HISTORY — DX: Chronic lymphocytic leukemia of B-cell type not having achieved remission: C91.10

## 2020-05-23 LAB — CBC WITH DIFFERENTIAL/PLATELET
Abs Immature Granulocytes: 0.11 10*3/uL — ABNORMAL HIGH (ref 0.00–0.07)
Basophils Absolute: 0.1 10*3/uL (ref 0.0–0.1)
Basophils Relative: 0 %
Eosinophils Absolute: 0.1 10*3/uL (ref 0.0–0.5)
Eosinophils Relative: 0 %
HCT: 53 % — ABNORMAL HIGH (ref 39.0–52.0)
Hemoglobin: 17.8 g/dL — ABNORMAL HIGH (ref 13.0–17.0)
Immature Granulocytes: 0 %
Lymphocytes Relative: 77 %
Lymphs Abs: 22.3 10*3/uL — ABNORMAL HIGH (ref 0.7–4.0)
MCH: 31.3 pg (ref 26.0–34.0)
MCHC: 33.6 g/dL (ref 30.0–36.0)
MCV: 93.1 fL (ref 80.0–100.0)
Monocytes Absolute: 0.6 10*3/uL (ref 0.1–1.0)
Monocytes Relative: 2 %
Neutro Abs: 6.2 10*3/uL (ref 1.7–7.7)
Neutrophils Relative %: 21 %
Platelets: 124 10*3/uL — ABNORMAL LOW (ref 150–400)
RBC: 5.69 MIL/uL (ref 4.22–5.81)
RDW: 13.9 % (ref 11.5–15.5)
Smear Review: NORMAL
WBC: 29.4 10*3/uL — ABNORMAL HIGH (ref 4.0–10.5)
nRBC: 0 % (ref 0.0–0.2)

## 2020-05-23 LAB — COMPREHENSIVE METABOLIC PANEL
ALT: 8 U/L (ref 0–44)
AST: 18 U/L (ref 15–41)
Albumin: 4 g/dL (ref 3.5–5.0)
Alkaline Phosphatase: 51 U/L (ref 38–126)
Anion gap: 13 (ref 5–15)
BUN: 9 mg/dL (ref 8–23)
CO2: 25 mmol/L (ref 22–32)
Calcium: 8.9 mg/dL (ref 8.9–10.3)
Chloride: 103 mmol/L (ref 98–111)
Creatinine, Ser: 0.95 mg/dL (ref 0.61–1.24)
GFR calc Af Amer: >60 mL/min (ref >60)
GFR calc non Af Amer: >60 mL/min (ref >60)
Glucose, Bld: 91 mg/dL (ref 70–99)
Potassium: 3.1 mmol/L — ABNORMAL LOW (ref 3.5–5.1)
Sodium: 141 mmol/L (ref 135–145)
Total Bilirubin: 2 mg/dL — ABNORMAL HIGH (ref 0.3–1.2)
Total Protein: 6.6 g/dL (ref 6.5–8.1)

## 2020-05-23 LAB — LIPASE, BLOOD: Lipase: 20 U/L (ref 11–51)

## 2020-05-23 MED ORDER — IOHEXOL 300 MG/ML  SOLN
100.0000 mL | Freq: Once | INTRAMUSCULAR | Status: AC | PRN
  Administered 2020-05-23: 100 mL via INTRAVENOUS

## 2020-05-23 MED ORDER — AMOXICILLIN-POT CLAVULANATE 875-125 MG PO TABS: 1.0000 | ORAL_TABLET | Freq: Two times a day (BID) | ORAL | 0 refills | Status: DC

## 2020-05-23 MED ORDER — AMOXICILLIN-POT CLAVULANATE 875-125 MG PO TABS
1.0000 | ORAL_TABLET | Freq: Once | ORAL | Status: AC
  Administered 2020-05-23: 1 via ORAL
  Filled 2020-05-23: qty 1

## 2020-05-23 MED ORDER — ONDANSETRON 4 MG PO TBDP: 4.0000 mg | ORAL_TABLET | Freq: Three times a day (TID) | ORAL | 0 refills | Status: DC | PRN

## 2020-05-23 MED ORDER — ONDANSETRON HCL 4 MG/2ML IJ SOLN
4.0000 mg | Freq: Once | INTRAMUSCULAR | Status: AC
  Administered 2020-05-23: 4 mg via INTRAVENOUS
  Filled 2020-05-23: qty 2

## 2020-05-23 MED ORDER — MORPHINE SULFATE (PF) 4 MG/ML IV SOLN
4.0000 mg | Freq: Once | INTRAVENOUS | Status: AC
  Administered 2020-05-23: 4 mg via INTRAVENOUS
  Filled 2020-05-23: qty 1

## 2020-05-23 MED ORDER — HYDROCODONE-ACETAMINOPHEN 5-325 MG PO TABS: 1.0000 | ORAL_TABLET | Freq: Four times a day (QID) | ORAL | 0 refills | Status: DC | PRN

## 2020-05-23 NOTE — ED Triage Notes (Signed)
C/o general abd pain that started on Saturday. States pain is constant. States he felt constipated and took a laxative Sat with relief. Denies any n/v. Denies any urinary symptoms. Denies any fevers. Hx of diverticulitis. Has not taken anything for pain.

## 2020-05-23 NOTE — ED Notes (Signed)
EDP at bedside  

## 2020-05-23 NOTE — ED Provider Notes (Signed)
[facility] EMERGENCY DEPARTMENT Provider Note   CSN: [id number] Arrival date & time: 05/23/20  0340     History Chief Complaint  Patient presents with  . Abdominal Pain    Corey [name] is a 79 y.o. male.  HPI     This is a 79 year old male with a history of CLL, hypertension, hypercholesterolemia who presents with abdominal pain.  Onset of symptoms was Saturday.  He reports lower abdominal sharp pain that became diffuse.  It is nonradiating.  He felt like he was constipated and took a laxative.  He was able to have bowel movement without any significant improvement of symptoms.  Denies nausea or vomiting.  Denies urinary symptoms.  No fevers.  He has not taken anything additional for his pain.  He currently rates his pain 8 out of 10.  Nothing seems to make it better or worse.  Pain is consistent with his prior episode of diverticulitis.  Past Medical History:  Diagnosis Date  . CLL (chronic lymphocytic leukemia) ([place])   . Diverticulitis   . Gout   . Hypercholesteremia   . Hypertension   . Prostate disorder     Patient Active Problem List   Diagnosis Date Noted  . Diverticulitis 06/20/2015  . Abdominal pain, epigastric 06/20/2015  . Sepsis ([place]) 06/20/2015  . Hypokalemia 06/20/2015  . Acute renal injury ([place]) 06/20/2015  . Benign essential HTN 06/20/2015  . Failure of outpatient treatment     Past Surgical History:  Procedure Laterality Date  . APPENDECTOMY         No family history on file.  Social History   Tobacco Use  . Smoking status: Never Smoker  . Smokeless tobacco: Never Used  Vaping Use  . Vaping Use: Never used  Substance Use Topics  . Alcohol use: Yes    Comment: occasional  . Drug use: No    Home Medications Prior to Admission medications   Medication Sig Start Date End Date Taking? Authorizing Provider  omeprazole (PRILOSEC) 40 MG capsule Take 40 mg by mouth daily.   Yes [provider]  allopurinol (ZYLOPRIM)  300 MG tablet Take 300 mg by mouth daily.    [provider]  amLODipine (NORVASC) 5 MG tablet Take 5 mg by mouth daily.    [provider]  amoxicillin-clavulanate (AUGMENTIN) 875-125 MG tablet Take 1 tablet by mouth every 12 (twelve) hours. 05/23/20   [place], Barbette Hair, MD  Cyanocobalamin (VITAMIN B 12) 500 MCG TABS Take 1 tablet by mouth daily.    [provider]  cyclobenzaprine (FLEXERIL) 5 MG tablet Take 1 tablet (5 mg total) by mouth 2 (two) times daily as needed for muscle spasms. Patient not taking: Reported on 04/05/2020 09/06/19   Lucrezia Starch, MD  finasteride (PROSCAR) 5 MG tablet Take 5 mg by mouth daily.    [provider]  HYDROcodone-acetaminophen (NORCO/VICODIN) 5-325 MG tablet Take 1 tablet by mouth every 6 (six) hours as needed. 05/23/20   Anish Vana, Barbette Hair, MD  omeprazole (PRILOSEC) 40 MG capsule Take 40 mg by mouth daily.    [provider]  ondansetron (ZOFRAN ODT) 4 MG disintegrating tablet Take 1 tablet (4 mg total) by mouth every 8 (eight) hours as needed. 05/23/20   Maley Venezia, Barbette Hair, MD  pravastatin (PRAVACHOL) 40 MG tablet Take 20 mg by mouth daily.     [provider]  Tamsulosin HCl (FLOMAX) 0.4 MG CAPS Take 0.4 mg by mouth daily after supper.  [provider]  tiZANidine (ZANAFLEX) 2 MG tablet Take 1 tablet (2 mg total) by mouth every 6 (six) hours as needed for muscle spasms. Patient not taking: Reported on 04/05/2020 06/30/19   Charlesetta Shanks, MD    Allergies    Patient has no known allergies.  Review of Systems   Review of Systems  Constitutional: Negative for fever.  Respiratory: Negative for shortness of breath.   Cardiovascular: Negative for chest pain.  Gastrointestinal: Positive for abdominal pain and constipation. Negative for diarrhea, nausea and vomiting.  Genitourinary: Negative for dysuria.  All other systems reviewed and are negative.   Physical Exam Updated Vital Signs BP  (!) 141/93 (BP Location: Right Arm)   Pulse 96   Temp 98.3 F (36.8 C) (Oral)   Resp 20   Ht 1.753 m (5\' 9" )   Wt 73 kg   SpO2 100%   BMI 23.78 kg/m   Physical Exam Vitals and nursing note reviewed.  Constitutional:      Appearance: He is well-developed. He is not ill-appearing.  HENT:     Head: Normocephalic and atraumatic.  Eyes:     Pupils: Pupils are equal, round, and reactive to light.  Cardiovascular:     Rate and Rhythm: Normal rate and regular rhythm.     Heart sounds: Normal heart sounds. No murmur heard.   Pulmonary:     Effort: Pulmonary effort is normal. No respiratory distress.     Breath sounds: Normal breath sounds. No wheezing.  Abdominal:     General: Bowel sounds are normal.     Palpations: Abdomen is soft.     Tenderness: There is generalized abdominal tenderness. There is no guarding or rebound. Negative signs include Murphy's sign.  Musculoskeletal:     Cervical back: Neck supple.  Lymphadenopathy:     Cervical: No cervical adenopathy.  Skin:    General: Skin is warm and dry.  Neurological:     Mental Status: He is alert and oriented to person, place, and time.  Psychiatric:        Mood and Affect: Mood normal.     ED Results / Procedures / Treatments   Labs (all labs ordered are listed, but only abnormal results are displayed) Labs Reviewed  CBC WITH DIFFERENTIAL/PLATELET - Abnormal; Notable for the following components:      Result Value   WBC 29.4 (*)    Hemoglobin 17.8 (*)    HCT 53.0 (*)    Platelets 124 (*)    Lymphs Abs 22.3 (*)    Abs Immature Granulocytes 0.11 (*)    All other components within normal limits  COMPREHENSIVE METABOLIC PANEL - Abnormal; Notable for the following components:   Potassium 3.1 (*)    Total Bilirubin 2.0 (*)    All other components within normal limits  LIPASE, BLOOD  URINALYSIS, ROUTINE W REFLEX MICROSCOPIC    EKG None  Radiology CT ABDOMEN PELVIS W CONTRAST  Result Date: 05/23/2020 CLINICAL  DATA:  Acute nonlocalized abdominal pain EXAM: CT ABDOMEN AND PELVIS WITH CONTRAST TECHNIQUE: Multidetector CT imaging of the abdomen and pelvis was performed using the standard protocol following bolus administration of intravenous contrast. CONTRAST:  113mL OMNIPAQUE IOHEXOL 300 MG/ML  SOLN COMPARISON:  04/23/2017 FINDINGS: Lower chest: No acute finding. Coronary atherosclerotic calcifications seen along the RCA. Hepatobiliary: Resolved liver steatosis. Small low densities in the right liver that are too small for densitometry. No evidence of biliary obstruction or stone. Pancreas: 7 mm cyst along the  mid pancreatic body, unchanged when measured on prior. No acute finding. Spleen: Unremarkable. Adrenals/Urinary Tract: Negative adrenals. No hydronephrosis or stone. Bilateral renal cortical cystic densities. Unremarkable bladder. Stomach/Bowel: Numerous sigmoid diverticula with mild pelvic fat haziness and trace pelvic peritoneal fluid/thickening. Sigmoid mesenteric lymph nodes are also slightly more prominent than before. No pneumoperitoneum or abscess. No bowel obstruction. Vascular/Lymphatic: Atheromatous calcifications. No acute vascular abnormality. No mass or adenopathy. Reproductive:Enlarged nodular prostate projecting into the bladder base, non progressed. Other: No ascites or pneumoperitoneum. Musculoskeletal: Spinal degeneration without acute finding. IMPRESSION: 1. Suspect early sigmoid diverticulitis. 2. Atherosclerosis, including the coronary arteries. 3. 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 Electronically Signed   By: Monte Fantasia M.D.   On: 05/23/2020 05:48    Procedures Procedures (including critical care time)  Medications Ordered in ED Medications  morphine 4 MG/ML injection 4 mg (4 mg Intravenous Given 05/23/20 0431)  ondansetron (ZOFRAN) injection 4 mg (4 mg Intravenous Given 05/23/20 0431)  iohexol (OMNIPAQUE) 300 MG/ML solution 100 mL (100 mLs Intravenous Contrast Given  05/23/20 0530)  amoxicillin-clavulanate (AUGMENTIN) 875-125 MG per tablet 1 tablet (1 tablet Oral Given 05/23/20 0608)    ED Course  I have reviewed the triage vital signs and the nursing notes.  Pertinent labs & imaging results that were available during my care of the patient were reviewed by me and considered in my medical decision making (see chart for details).    MDM Rules/Calculators/A&P                           Patient presents with abdominal pain.  Overall nontoxic and vital signs are reassuring.  He is afebrile.  Reports pain is consistent with prior episodes of diverticulitis.  He has some diffuse tenderness without signs of peritonitis on exam.  Other considerations include but not limited to, SBO, gastritis or gastroenteritis, pancreatitis, cholecystitis.  Labs obtained.  White blood cell count 29 which is consistent with priors.  Likely consistent with his prior CLL.  No significant metabolic derangement.  Lipase is normal.  Doubt pancreatitis.  Repeat CT scan of the abdomen obtained and does show what appears to be likely early sigmoid diverticulitis.  No perforation or abscess noted.  Patient was given a dose of Augmentin.  He reports his pain is under control.  We discussed outpatient management.  Patient is agreeable to plan.  He was given a short course of antibiotics and pain medication for symptom control at home.  After history, exam, and medical workup I feel the patient has been appropriately medically screened and is safe for discharge home. Pertinent diagnoses were discussed with the patient. Patient was given return precautions.   Final Clinical Impression(s) / ED Diagnoses Final diagnoses:  Diverticulitis    Rx / DC Orders ED Discharge Orders         Ordered    amoxicillin-clavulanate (AUGMENTIN) 875-125 MG tablet  Every 12 hours        05/23/20 0613    HYDROcodone-acetaminophen (NORCO/VICODIN) 5-325 MG tablet  Every 6 hours PRN        05/23/20 0613     ondansetron (ZOFRAN ODT) 4 MG disintegrating tablet  Every 8 hours PRN        05/23/20 0613           Merryl Hacker, MD 05/23/20 [phone]

## 2020-08-18 LAB — EXTERNAL GENERIC LAB PROCEDURE: COLOGUARD: NEGATIVE

## 2020-08-18 LAB — COLOGUARD: COLOGUARD: NEGATIVE

## 2021-04-05 ENCOUNTER — Ambulatory Visit: Payer: Medicare Other | Admitting: Hematology

## 2021-04-05 ENCOUNTER — Other Ambulatory Visit: Payer: Medicare Other

## 2021-04-11 NOTE — Progress Notes (Signed)
HEMATOLOGY/ONCOLOGY CONSULTATION NOTE  Date of Service: 04/11/2021  Patient Care Team: Christain Sacramento, MD as PCP - General (Family Medicine)  CHIEF COMPLAINTS/PURPOSE OF CONSULTATION:  CLL  HISTORY OF PRESENTING ILLNESS:   Corey [name] is a wonderful 80 y.o. 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 The pt reports that he is doing well overall.   The pt reports that he has recently had prostatitis and his blood pressure has been abnormally high. Pt has previously been Vitamin B12 deficient, but began taking a Vitamin B12 supplement at the recommendation of Bing Matter PA-C. He also takes Centrum daily multivitamin.  Pt has been on Omeprazole for 10+ years. Pt notes that he has lost nearly 20 lbs over the course of the last year or so due to eating less because of the pandemic. Pt lives alone and is able to take care of his needs without issue. He lost his mother within the last month. This has contributed greatly to his stress. Pt denies any consitutional symptoms or any new concerns.   Most recent lab results (02/17/2020) of CBC w/diff is as follows: all values are WNL except for WBC at 30.2K, HCT at 52.1, PLT at 124K, Lymphs Abs at 26.0K. 02/17/2020 Vitamin B12 at 315  On review of systems, pt reports stress and denies fatigue, rash, fevers, chills, night sweats, rapid weight loss, new lumps/bumps, abnormal bruising/bleeding and any other symptoms.   On PMHx the pt reports Prostatitis, HTN, HLD, Appendectomy, CLL On Social Hx the pt reports that he is a non-smoker.   INTERVAL HISTORY Corey [name] is a wonderful 80 y.o. male who is here for f/u of CLL. [name] last visit with [place] was on 04/05/2020. The pt reports that he is doing well overall.  The pt reports no acute new symptoms. No fevers/chills/nightsweats. No new lumps or bumps.  Lab results today 04/12/2021 reviewed with patient.  On review of systems, pt  reports no other acute symptoms.   MEDICAL HISTORY:  Past Medical History:  Diagnosis Date   CLL (chronic lymphocytic leukemia) ([place])    Diverticulitis    Gout    Hypercholesteremia    Hypertension    Prostate disorder    Acute renal injury ([place]) 06/20/2015   CLL (chronic lymphocytic leukemia) ([place])   SURGICAL HISTORY: Past Surgical History:  Procedure Laterality Date   APPENDECTOMY      SOCIAL HISTORY: Social History   Socioeconomic History   Marital status: Divorced    Spouse name: Not on file   Number of children: Not on file   Years of education: Not on file   Highest education level: Not on file  Occupational History   Not on file  Tobacco Use   Smoking status: Never   Smokeless tobacco: Never  Vaping Use   Vaping Use: Never used  Substance and Sexual Activity   Alcohol use: Yes    Comment: occasional   Drug use: No   Sexual activity: Not on file  Other Topics Concern   Not on file  Social History Narrative   Not on file   Social Determinants of Health   Financial Resource Strain: Not on file  Food Insecurity: Not on file  Transportation Needs: Not on file  Physical Activity: Not on file  Stress: Not on file  Social Connections: Not on file  Intimate Partner Violence: Not on file    FAMILY HISTORY: No  family history on file.  ALLERGIES:  has No Known Allergies.  MEDICATIONS:  Current Outpatient Medications  Medication Sig Dispense Refill   allopurinol (ZYLOPRIM) 300 MG tablet Take 300 mg by mouth daily.     amLODipine (NORVASC) 5 MG tablet Take 5 mg by mouth daily.     amoxicillin-clavulanate (AUGMENTIN) 875-125 MG tablet Take 1 tablet by mouth every 12 (twelve) hours. 20 tablet 0   Cyanocobalamin (VITAMIN B 12) 500 MCG TABS Take 1 tablet by mouth daily.     cyclobenzaprine (FLEXERIL) 5 MG tablet Take 1 tablet (5 mg total) by mouth 2 (two) times daily as needed for muscle spasms. (Patient not taking: Reported on 04/05/2020) 30 tablet 0    finasteride (PROSCAR) 5 MG tablet Take 5 mg by mouth daily.     HYDROcodone-acetaminophen (NORCO/VICODIN) 5-325 MG tablet Take 1 tablet by mouth every 6 (six) hours as needed. 10 tablet 0   omeprazole (PRILOSEC) 40 MG capsule Take 40 mg by mouth daily.     omeprazole (PRILOSEC) 40 MG capsule Take 40 mg by mouth daily.     ondansetron (ZOFRAN ODT) 4 MG disintegrating tablet Take 1 tablet (4 mg total) by mouth every 8 (eight) hours as needed. 20 tablet 0   pravastatin (PRAVACHOL) 40 MG tablet Take 20 mg by mouth daily.      Tamsulosin HCl (FLOMAX) 0.4 MG CAPS Take 0.4 mg by mouth daily after supper.      tiZANidine (ZANAFLEX) 2 MG tablet Take 1 tablet (2 mg total) by mouth every 6 (six) hours as needed for muscle spasms. (Patient not taking: Reported on 04/05/2020) 30 tablet 0   No current facility-administered medications for this visit.    REVIEW OF SYSTEMS:    10 Point review of Systems was done is negative except as noted above.  PHYSICAL EXAMINATION: ECOG PERFORMANCE STATUS: 0 - Asymptomatic  . There were no vitals filed for this visit.  There were no vitals filed for this visit.  .There is no height or weight on file to calculate BMI.   NAD GENERAL:alert, in no acute distress and comfortable SKIN: no acute rashes, no significant lesions EYES: conjunctiva are pink and non-injected, sclera anicteric OROPHARYNX: MMM, no exudates, no oropharyngeal erythema or ulceration NECK: supple, no JVD LYMPH:  no palpable lymphadenopathy in the axillary or inguinal regions. Few, small cervical lymph nodes. LUNGS: clear to auscultation b/l with normal respiratory effort HEART: regular rate & rhythm ABDOMEN:  normoactive bowel sounds , non tender, not distended. Extremity: no pedal edema PSYCH: alert & oriented x 3 with fluent speech NEURO: no focal motor/sensory deficits  LABORATORY DATA:  I have reviewed the data as listed  . CBC Latest Ref Rng & Units 04/12/2021 05/23/2020 04/05/2020   WBC 4.0 - 10.5 K/uL 36.0(H) 29.4(H) 30.7(H)  Hemoglobin 13.0 - 17.0 g/dL 17.1(H) 17.8(H) 18.5(H)  Hematocrit 39.0 - 52.0 % 50.2 53.0(H) 56.6(H)  Platelets 150 - 400 K/uL 135(L) 124(L) 144(L)    . CMP Latest Ref Rng & Units 04/12/2021 05/23/2020 04/05/2020  Glucose 70 - 99 mg/dL 97 91 96  BUN 8 - 23 mg/dL 12 9 8   Creatinine 0.61 - 1.24 mg/dL 1.19 0.95 1.03  Sodium 135 - 145 mmol/L 141 141 144  Potassium 3.5 - 5.1 mmol/L 3.7 3.1(L) 3.5  Chloride 98 - 111 mmol/L 103 103 105  CO2 22 - 32 mmol/L 28 25 27   Calcium 8.9 - 10.3 mg/dL 9.1 8.9 9.3  Total Protein 6.5 - 8.1 g/dL  7.1 6.6 7.1  Total Bilirubin 0.3 - 1.2 mg/dL 1.6(H) 2.0(H) 1.2  Alkaline Phos 38 - 126 U/L 48 51 65  AST 15 - 41 U/L 22 18 17   ALT 0 - 44 U/L 16 8 15              RADIOGRAPHIC STUDIES: I have personally reviewed the radiological images as listed and agreed with the findings in the report. No results found.  ASSESSMENT & PLAN:   80 yo with   1) Stage 0 Chronic lymphocytic Leukemia  No overt signs of symptomatic disease progression at this time. CLL FISH with mono and bialleleic 13q deletion.  PLAN: -Discussed pt labwork today, 04/12/2021; wbc count 36k slightly up from 29.4k No anemia , mild thrombocytopenia plt 135k - no significant CLL progression to warrant treatment consideration at this time. -no issues with significant infections. -Recommend pt stay up-to-date with age-appropriate vaccinations. 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 Plan -encouraged patient to drink atleast 48-60oz of water daily -consider w/u for secondary causes of polycythemia with PCP -- would recommend PFTs to r/o chronic lung disease and sleep study to r/o sleep apnea.   FOLLOW UP: RTC with Dr Irene Limbo with labs in 12 months   All of the patients questions were answered  with apparent satisfaction. The patient knows to call the clinic with any problems, questions or concerns.   The total time spent in the appointment was 20 minutes and more than 50% was on counseling and direct patient cares.    Sullivan Lone MD [place] AAHIVMS [facility] Hematology/Oncology Physician [facility]  (Office):       [phone] (Work cell):  [phone] (Fax):           [phone]  04/11/2021 4:18 PM  I, Reinaldo Raddle, am acting as scribe for Dr. Sullivan Lone, MD.    .I have reviewed the above documentation for accuracy and completeness, and I agree with the above. Brunetta Genera MD

## 2021-04-12 ENCOUNTER — Inpatient Hospital Stay: Payer: Medicare Other | Admitting: Hematology

## 2021-04-12 ENCOUNTER — Other Ambulatory Visit: Payer: Self-pay | Admitting: Hematology

## 2021-04-12 ENCOUNTER — Inpatient Hospital Stay: Payer: Medicare Other | Attending: Hematology

## 2021-04-12 ENCOUNTER — Other Ambulatory Visit: Payer: Self-pay

## 2021-04-12 VITALS — BP 135/81 | HR 75 | Temp 97.8°F | Resp 17 | Ht 69.0 in | Wt 166.8 lb

## 2021-04-12 DIAGNOSIS — C9110 Chronic lymphocytic leukemia of B-cell type not having achieved remission: Secondary | ICD-10-CM | POA: Diagnosis present

## 2021-04-12 DIAGNOSIS — E538 Deficiency of other specified B group vitamins: Secondary | ICD-10-CM | POA: Diagnosis not present

## 2021-04-12 LAB — CBC WITH DIFFERENTIAL (CANCER CENTER ONLY)
Abs Immature Granulocytes: 0.04 10*3/uL (ref 0.00–0.07)
Basophils Absolute: 0.1 10*3/uL (ref 0.0–0.1)
Basophils Relative: 0 %
Eosinophils Absolute: 0.1 10*3/uL (ref 0.0–0.5)
Eosinophils Relative: 0 %
HCT: 50.2 % (ref 39.0–52.0)
Hemoglobin: 17.1 g/dL — ABNORMAL HIGH (ref 13.0–17.0)
Immature Granulocytes: 0 %
Lymphocytes Relative: 89 %
Lymphs Abs: 31.6 10*3/uL — ABNORMAL HIGH (ref 0.7–4.0)
MCH: 32.6 pg (ref 26.0–34.0)
MCHC: 34.1 g/dL (ref 30.0–36.0)
MCV: 95.6 fL (ref 80.0–100.0)
Monocytes Absolute: 0.5 10*3/uL (ref 0.1–1.0)
Monocytes Relative: 1 %
Neutro Abs: 3.7 10*3/uL (ref 1.7–7.7)
Neutrophils Relative %: 10 %
Platelet Count: 135 10*3/uL — ABNORMAL LOW (ref 150–400)
RBC: 5.25 MIL/uL (ref 4.22–5.81)
RDW: 13.5 % (ref 11.5–15.5)
WBC Count: 36 10*3/uL — ABNORMAL HIGH (ref 4.0–10.5)
nRBC: 0 % (ref 0.0–0.2)

## 2021-04-12 LAB — CMP (CANCER CENTER ONLY)
ALT: 16 U/L (ref 0–44)
AST: 22 U/L (ref 15–41)
Albumin: 4.4 g/dL (ref 3.5–5.0)
Alkaline Phosphatase: 48 U/L (ref 38–126)
Anion gap: 10 (ref 5–15)
BUN: 12 mg/dL (ref 8–23)
CO2: 28 mmol/L (ref 22–32)
Calcium: 9.1 mg/dL (ref 8.9–10.3)
Chloride: 103 mmol/L (ref 98–111)
Creatinine: 1.19 mg/dL (ref 0.61–1.24)
GFR, Estimated: >60 mL/min (ref >60)
Glucose, Bld: 97 mg/dL (ref 70–99)
Potassium: 3.7 mmol/L (ref 3.5–5.1)
Sodium: 141 mmol/L (ref 135–145)
Total Bilirubin: 1.6 mg/dL — ABNORMAL HIGH (ref 0.3–1.2)
Total Protein: 7.1 g/dL (ref 6.5–8.1)

## 2021-04-12 LAB — LACTATE DEHYDROGENASE: LDH: 155 U/L (ref 98–192)

## 2021-04-13 ENCOUNTER — Telehealth: Payer: Self-pay | Admitting: Hematology

## 2021-04-13 NOTE — Telephone Encounter (Signed)
Scheduled follow-up appointment per 7/20 los. Patient is aware. 

## 2022-04-02 ENCOUNTER — Telehealth: Payer: Self-pay | Admitting: Hematology

## 2022-04-02 NOTE — Telephone Encounter (Signed)
Rescheduled upcoming appointment due to provider's PAL. Patient is aware of changes. ?

## 2022-04-12 ENCOUNTER — Inpatient Hospital Stay: Payer: Medicare Other | Admitting: Hematology

## 2022-04-12 ENCOUNTER — Inpatient Hospital Stay: Payer: Medicare Other

## 2022-05-22 ENCOUNTER — Other Ambulatory Visit: Payer: Self-pay | Admitting: *Deleted

## 2022-05-22 DIAGNOSIS — C9110 Chronic lymphocytic leukemia of B-cell type not having achieved remission: Secondary | ICD-10-CM

## 2022-05-23 ENCOUNTER — Inpatient Hospital Stay: Payer: Medicare Other | Admitting: Hematology

## 2022-05-23 ENCOUNTER — Inpatient Hospital Stay: Payer: Medicare Other | Attending: Hematology

## 2022-05-23 ENCOUNTER — Other Ambulatory Visit: Payer: Self-pay

## 2022-05-23 VITALS — BP 155/83 | HR 64 | Temp 97.7°F | Resp 18 | Ht 69.0 in | Wt 163.2 lb

## 2022-05-23 DIAGNOSIS — C9110 Chronic lymphocytic leukemia of B-cell type not having achieved remission: Secondary | ICD-10-CM

## 2022-05-23 DIAGNOSIS — E538 Deficiency of other specified B group vitamins: Secondary | ICD-10-CM | POA: Diagnosis not present

## 2022-05-23 DIAGNOSIS — Z79899 Other long term (current) drug therapy: Secondary | ICD-10-CM | POA: Diagnosis not present

## 2022-05-23 LAB — CBC WITH DIFFERENTIAL (CANCER CENTER ONLY)
Abs Immature Granulocytes: 0.05 10*3/uL (ref 0.00–0.07)
Basophils Absolute: 0.1 10*3/uL (ref 0.0–0.1)
Basophils Relative: 0 %
Eosinophils Absolute: 0.1 10*3/uL (ref 0.0–0.5)
Eosinophils Relative: 0 %
HCT: 49 % (ref 39.0–52.0)
Hemoglobin: 16.8 g/dL (ref 13.0–17.0)
Immature Granulocytes: 0 %
Lymphocytes Relative: 87 %
Lymphs Abs: 30.7 10*3/uL — ABNORMAL HIGH (ref 0.7–4.0)
MCH: 32.9 pg (ref 26.0–34.0)
MCHC: 34.3 g/dL (ref 30.0–36.0)
MCV: 96.1 fL (ref 80.0–100.0)
Monocytes Absolute: 0.6 10*3/uL (ref 0.1–1.0)
Monocytes Relative: 2 %
Neutro Abs: 4 10*3/uL (ref 1.7–7.7)
Neutrophils Relative %: 11 %
Platelet Count: 123 10*3/uL — ABNORMAL LOW (ref 150–400)
RBC: 5.1 MIL/uL (ref 4.22–5.81)
RDW: 12.6 % (ref 11.5–15.5)
Smear Review: NORMAL
WBC Count: 35.5 10*3/uL — ABNORMAL HIGH (ref 4.0–10.5)
nRBC: 0 % (ref 0.0–0.2)

## 2022-05-23 LAB — CMP (CANCER CENTER ONLY)
ALT: 15 U/L (ref 0–44)
AST: 20 U/L (ref 15–41)
Albumin: 4.3 g/dL (ref 3.5–5.0)
Alkaline Phosphatase: 49 U/L (ref 38–126)
Anion gap: 5 (ref 5–15)
BUN: 9 mg/dL (ref 8–23)
CO2: 29 mmol/L (ref 22–32)
Calcium: 9.1 mg/dL (ref 8.9–10.3)
Chloride: 107 mmol/L (ref 98–111)
Creatinine: 1.11 mg/dL (ref 0.61–1.24)
GFR, Estimated: >60 mL/min (ref >60)
Glucose, Bld: 104 mg/dL — ABNORMAL HIGH (ref 70–99)
Potassium: 3.9 mmol/L (ref 3.5–5.1)
Sodium: 141 mmol/L (ref 135–145)
Total Bilirubin: 0.8 mg/dL (ref 0.3–1.2)
Total Protein: 6.4 g/dL — ABNORMAL LOW (ref 6.5–8.1)

## 2022-05-23 LAB — LACTATE DEHYDROGENASE: LDH: 113 U/L (ref 98–192)

## 2022-05-24 ENCOUNTER — Telehealth: Payer: Self-pay | Admitting: Hematology

## 2022-05-24 NOTE — Telephone Encounter (Signed)
Scheduled follow-up appointment per 8/30 los. Patient is aware. 

## 2022-05-28 NOTE — Progress Notes (Signed)
HEMATOLOGY/ONCOLOGY CLINIC NOTE  Date of Service: 05/23/2022  Patient Care Team: Christain Sacramento, MD as PCP - General (Family Medicine)  CHIEF COMPLAINTS/PURPOSE OF CONSULTATION:  CLL  HISTORY OF PRESENTING ILLNESS:  Corey [name] is a wonderful 81 y.o. 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 The pt reports that he is doing well overall.   The pt reports that he has recently had prostatitis and his blood pressure has been abnormally high. Pt has previously been Vitamin B12 deficient, but began taking a Vitamin B12 supplement at the recommendation of Bing Matter PA-C. He also takes Centrum daily multivitamin.  Pt has been on Omeprazole for 10+ years. Pt notes that he has lost nearly 20 lbs over the course of the last year or so due to eating less because of the pandemic. Pt lives alone and is able to take care of his needs without issue. He lost his mother within the last month. This has contributed greatly to his stress. Pt denies any consitutional symptoms or any new concerns.   Most recent lab results (02/17/2020) of CBC w/diff is as follows: all values are WNL except for WBC at 30.2K, HCT at 52.1, PLT at 124K, Lymphs Abs at 26.0K. 02/17/2020 Vitamin B12 at 315  On review of systems, pt reports stress and denies fatigue, rash, fevers, chills, night sweats, rapid weight loss, new lumps/bumps, abnormal bruising/bleeding and any other symptoms.   On PMHx the pt reports Prostatitis, HTN, HLD, Appendectomy, CLL On Social Hx the pt reports that he is a non-smoker.   INTERVAL HISTORY  Corey [name] is a wonderful 81 y.o. male who is here for f/u of CLL. [name] last visit with [place] was on 04/12/2021. He reports He is doing well with no new symptoms or concerns. No fever, chills, night sweats. No new lumps, bumps, or lesions/rashes. No other new or acute focal symptoms.  Labs done today were reviewed in detail.  MEDICAL  HISTORY:  Past Medical History:  Diagnosis Date   CLL (chronic lymphocytic leukemia) ([place])    Diverticulitis    Gout    Hypercholesteremia    Hypertension    Prostate disorder    Acute renal injury ([place]) 06/20/2015   CLL (chronic lymphocytic leukemia) ([place])   SURGICAL HISTORY: Past Surgical History:  Procedure Laterality Date   APPENDECTOMY      SOCIAL HISTORY: Social History   Socioeconomic History   Marital status: Divorced    Spouse name: Not on file   Number of children: Not on file   Years of education: Not on file   Highest education level: Not on file  Occupational History   Not on file  Tobacco Use   Smoking status: Never   Smokeless tobacco: Never  Vaping Use   Vaping Use: Never used  Substance and Sexual Activity   Alcohol use: Yes    Comment: occasional   Drug use: No   Sexual activity: Not on file  Other Topics Concern   Not on file  Social History Narrative   Not on file   Social Determinants of Health   Financial Resource Strain: Not on file  Food Insecurity: Not on file  Transportation Needs: Not on file  Physical Activity: Not on file  Stress: Not on file  Social Connections: Not on file  Intimate Partner Violence: Not on file    FAMILY HISTORY: No family history on file.  ALLERGIES:  has No Known Allergies.  MEDICATIONS:  Current Outpatient Medications  Medication Sig Dispense Refill   allopurinol (ZYLOPRIM) 300 MG tablet Take 300 mg by mouth daily.     amLODipine (NORVASC) 5 MG tablet Take 5 mg by mouth daily.     amoxicillin-clavulanate (AUGMENTIN) 875-125 MG tablet Take 1 tablet by mouth every 12 (twelve) hours. (Patient not taking: Reported on 04/12/2021) 20 tablet 0   Cyanocobalamin (VITAMIN B 12) 500 MCG TABS Take 1 tablet by mouth daily.     cyclobenzaprine (FLEXERIL) 5 MG tablet Take 1 tablet (5 mg total) by mouth 2 (two) times daily as needed for muscle spasms. (Patient not taking: No sig reported) 30 tablet 0   finasteride  (PROSCAR) 5 MG tablet Take 5 mg by mouth daily.     HYDROcodone-acetaminophen (NORCO/VICODIN) 5-325 MG tablet Take 1 tablet by mouth every 6 (six) hours as needed. (Patient not taking: No sig reported) 10 tablet 0   omeprazole (PRILOSEC) 40 MG capsule Take 40 mg by mouth daily.     omeprazole (PRILOSEC) 40 MG capsule Take 40 mg by mouth daily. (Patient not taking: Reported on 04/12/2021)     ondansetron (ZOFRAN ODT) 4 MG disintegrating tablet Take 1 tablet (4 mg total) by mouth every 8 (eight) hours as needed. (Patient not taking: Reported on 04/12/2021) 20 tablet 0   pravastatin (PRAVACHOL) 40 MG tablet Take 20 mg by mouth daily.      Tamsulosin HCl (FLOMAX) 0.4 MG CAPS Take 0.4 mg by mouth daily after supper.      tiZANidine (ZANAFLEX) 2 MG tablet Take 1 tablet (2 mg total) by mouth every 6 (six) hours as needed for muscle spasms. (Patient not taking: No sig reported) 30 tablet 0   No current facility-administered medications for this visit.    REVIEW OF SYSTEMS:    10 Point review of Systems was done is negative except as noted above.  PHYSICAL EXAMINATION: ECOG PERFORMANCE STATUS: 0 - Asymptomatic  . Vitals:   05/23/22 1301  BP: (!) 155/83  Pulse: 64  Resp: 18  Temp: 97.7 F (36.5 C)  SpO2: 97%    Filed Weights   05/23/22 1301  Weight: 163 lb 3.2 oz (74 kg)    .Body mass index is 24.1 kg/m.   NAD GENERAL:alert, in no acute distress and comfortable SKIN: no acute rashes, no significant lesions EYES: conjunctiva are pink and non-injected, sclera anicteric NECK: supple, no JVD LYMPH:  no palpable lymphadenopathy in the cervical, axillary or inguinal regions LUNGS: clear to auscultation b/l with normal respiratory effort HEART: regular rate & rhythm ABDOMEN:  normoactive bowel sounds , non tender, not distended. Extremity: no pedal edema PSYCH: alert & oriented x 3 with fluent speech NEURO: no focal motor/sensory deficits  LABORATORY DATA:  I have reviewed the data  as listed  .    Latest Ref Rng & Units 05/23/2022   12:08 PM 04/12/2021    2:00 PM 05/23/2020    4:37 AM  CBC  WBC 4.0 - 10.5 K/uL 35.5  36.0  29.4   Hemoglobin 13.0 - 17.0 g/dL 16.8  17.1  17.8   Hematocrit 39.0 - 52.0 % 49.0  50.2  53.0   Platelets 150 - 400 K/uL 123  135  124     .    Latest Ref Rng & Units 05/23/2022   12:08 PM 04/12/2021    2:00 PM 05/23/2020    4:37 AM  CMP  Glucose 70 - 99 mg/dL  104  97  91   BUN 8 - 23 mg/dL '9  12  9   '$ Creatinine 0.61 - 1.24 mg/dL 1.11  1.19  0.95   Sodium 135 - 145 mmol/L 141  141  141   Potassium 3.5 - 5.1 mmol/L 3.9  3.7  3.1   Chloride 98 - 111 mmol/L 107  103  103   CO2 22 - 32 mmol/L '29  28  25   '$ Calcium 8.9 - 10.3 mg/dL 9.1  9.1  8.9   Total Protein 6.5 - 8.1 g/dL 6.4  7.1  6.6   Total Bilirubin 0.3 - 1.2 mg/dL 0.8  1.6  2.0   Alkaline Phos 38 - 126 U/L 49  48  51   AST 15 - 41 U/L '20  22  18   '$ ALT 0 - 44 U/L '15  16  8              '$ RADIOGRAPHIC STUDIES: I have personally reviewed the radiological images as listed and agreed with the findings in the report. No results found.  ASSESSMENT & PLAN:   81 yo with   1) Stage 0 Chronic lymphocytic Leukemia  No overt signs of symptomatic disease progression at this time. CLL FISH with mono and bialleleic 13q deletion.  PLAN: -Discussed pt labwork today, 05/23/2022; wbc count 35.5k. No anemia , mild thrombocytopenia plt 123k - no significant CLL progression to warrant treatment consideration at this time. -no issues with significant infections. -Recommend pt stay up-to-date with age-appropriate vaccinations. 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 Plan -encouraged patient to drink atleast 48-60oz of water daily -consider w/u for secondary causes of polycythemia with PCP -- would recommend PFTs to r/o chronic lung  disease and sleep study to r/o sleep apnea.   FOLLOW UP: RTC with Dr Irene Limbo with labs in 12 months   All of the patients questions were answered with apparent satisfaction. The patient knows to call the clinic with any problems, questions or concerns.   The total time spent in the appointment was 20 minutes and more than 50% was on counseling and direct patient cares.    Sullivan Lone MD [place] AAHIVMS [facility] Hematology/Oncology Physician [facility]  (Office):       [phone] (Work cell):  [phone] (Fax):           [phone]  I, Melene Muller, am acting as scribe for Dr. Sullivan Lone, MD.  .I have reviewed the above documentation for accuracy and completeness, and I agree with the above.Brunetta Genera MD

## 2022-07-31 ENCOUNTER — Inpatient Hospital Stay (HOSPITAL_COMMUNITY): Payer: Medicare Other

## 2022-07-31 ENCOUNTER — Emergency Department (HOSPITAL_COMMUNITY): Admit: 2022-07-31 | Payer: Medicare Other | Admitting: Cardiology

## 2022-07-31 ENCOUNTER — Encounter (HOSPITAL_COMMUNITY): Payer: Self-pay | Admitting: Cardiology

## 2022-07-31 ENCOUNTER — Inpatient Hospital Stay (HOSPITAL_COMMUNITY)
Admission: EM | Disposition: A | Discharge status: Home / Self Care | Payer: Self-pay | Source: Home / Self Care | Attending: Thoracic Surgery (Cardiothoracic Vascular Surgery)

## 2022-07-31 ENCOUNTER — Encounter (HOSPITAL_COMMUNITY): Payer: Self-pay

## 2022-07-31 ENCOUNTER — Emergency Department (HOSPITAL_COMMUNITY): Payer: Medicare Other

## 2022-07-31 ENCOUNTER — Inpatient Hospital Stay (HOSPITAL_COMMUNITY)
Admission: EM | Admit: 2022-07-31 | Discharge: 2022-08-06 | DRG: 234 | Disposition: A | Discharge status: Home / Self Care | Payer: Medicare Other | Attending: Thoracic Surgery (Cardiothoracic Vascular Surgery) | Admitting: Thoracic Surgery (Cardiothoracic Vascular Surgery)

## 2022-07-31 DIAGNOSIS — I213 ST elevation (STEMI) myocardial infarction of unspecified site: Secondary | ICD-10-CM | POA: Diagnosis present

## 2022-07-31 DIAGNOSIS — I2510 Atherosclerotic heart disease of native coronary artery without angina pectoris: Secondary | ICD-10-CM

## 2022-07-31 DIAGNOSIS — J9811 Atelectasis: Secondary | ICD-10-CM | POA: Diagnosis not present

## 2022-07-31 DIAGNOSIS — R451 Restlessness and agitation: Secondary | ICD-10-CM | POA: Diagnosis not present

## 2022-07-31 DIAGNOSIS — E7800 Pure hypercholesterolemia, unspecified: Secondary | ICD-10-CM | POA: Diagnosis present

## 2022-07-31 DIAGNOSIS — N400 Enlarged prostate without lower urinary tract symptoms: Secondary | ICD-10-CM | POA: Diagnosis present

## 2022-07-31 DIAGNOSIS — E8770 Fluid overload, unspecified: Secondary | ICD-10-CM | POA: Diagnosis present

## 2022-07-31 DIAGNOSIS — Z1152 Encounter for screening for COVID-19: Secondary | ICD-10-CM

## 2022-07-31 DIAGNOSIS — Z8249 Family history of ischemic heart disease and other diseases of the circulatory system: Secondary | ICD-10-CM

## 2022-07-31 DIAGNOSIS — I700 Atherosclerosis of aorta: Secondary | ICD-10-CM | POA: Diagnosis present

## 2022-07-31 DIAGNOSIS — R410 Disorientation, unspecified: Secondary | ICD-10-CM | POA: Diagnosis not present

## 2022-07-31 DIAGNOSIS — I110 Hypertensive heart disease with heart failure: Secondary | ICD-10-CM | POA: Diagnosis not present

## 2022-07-31 DIAGNOSIS — I509 Heart failure, unspecified: Secondary | ICD-10-CM | POA: Diagnosis not present

## 2022-07-31 DIAGNOSIS — Z01810 Encounter for preprocedural cardiovascular examination: Secondary | ICD-10-CM | POA: Diagnosis not present

## 2022-07-31 DIAGNOSIS — I10 Essential (primary) hypertension: Secondary | ICD-10-CM | POA: Diagnosis present

## 2022-07-31 DIAGNOSIS — Z951 Presence of aortocoronary bypass graft: Secondary | ICD-10-CM

## 2022-07-31 DIAGNOSIS — Z79899 Other long term (current) drug therapy: Secondary | ICD-10-CM | POA: Diagnosis not present

## 2022-07-31 DIAGNOSIS — C9110 Chronic lymphocytic leukemia of B-cell type not having achieved remission: Secondary | ICD-10-CM | POA: Diagnosis present

## 2022-07-31 DIAGNOSIS — I2121 ST elevation (STEMI) myocardial infarction involving left circumflex coronary artery: Secondary | ICD-10-CM | POA: Diagnosis not present

## 2022-07-31 DIAGNOSIS — I25110 Atherosclerotic heart disease of native coronary artery with unstable angina pectoris: Secondary | ICD-10-CM | POA: Diagnosis not present

## 2022-07-31 DIAGNOSIS — I252 Old myocardial infarction: Secondary | ICD-10-CM | POA: Diagnosis not present

## 2022-07-31 DIAGNOSIS — I219 Acute myocardial infarction, unspecified: Secondary | ICD-10-CM | POA: Diagnosis not present

## 2022-07-31 DIAGNOSIS — I214 Non-ST elevation (NSTEMI) myocardial infarction: Secondary | ICD-10-CM | POA: Diagnosis not present

## 2022-07-31 HISTORY — PX: LEFT HEART CATH AND CORONARY ANGIOGRAPHY: CATH118249

## 2022-07-31 LAB — ECHOCARDIOGRAM COMPLETE
Area-P 1/2: 3.96 cm2
Height: 69 in
S' Lateral: 2.8 cm
Weight: 2560 oz

## 2022-07-31 LAB — COMPREHENSIVE METABOLIC PANEL
ALT: 22 U/L (ref 0–44)
AST: 29 U/L (ref 15–41)
Albumin: 3.4 g/dL — ABNORMAL LOW (ref 3.5–5.0)
Alkaline Phosphatase: 47 U/L (ref 38–126)
Anion gap: 10 (ref 5–15)
BUN: 9 mg/dL (ref 8–23)
CO2: 24 mmol/L (ref 22–32)
Calcium: 8.8 mg/dL — ABNORMAL LOW (ref 8.9–10.3)
Chloride: 106 mmol/L (ref 98–111)
Creatinine, Ser: 1.04 mg/dL (ref 0.61–1.24)
GFR, Estimated: >60 mL/min (ref >60)
Glucose, Bld: 125 mg/dL — ABNORMAL HIGH (ref 70–99)
Potassium: 4.9 mmol/L (ref 3.5–5.1)
Sodium: 140 mmol/L (ref 135–145)
Total Bilirubin: 1.2 mg/dL (ref 0.3–1.2)
Total Protein: 5.8 g/dL — ABNORMAL LOW (ref 6.5–8.1)

## 2022-07-31 LAB — CBC WITH DIFFERENTIAL/PLATELET
Abs Immature Granulocytes: 0.05 10*3/uL (ref 0.00–0.07)
Basophils Absolute: 0.1 10*3/uL (ref 0.0–0.1)
Basophils Relative: 0 %
Eosinophils Absolute: 0.1 10*3/uL (ref 0.0–0.5)
Eosinophils Relative: 0 %
HCT: 45.2 % (ref 39.0–52.0)
Hemoglobin: 15.5 g/dL (ref 13.0–17.0)
Immature Granulocytes: 0 %
Lymphocytes Relative: 89 %
Lymphs Abs: 33.6 10*3/uL — ABNORMAL HIGH (ref 0.7–4.0)
MCH: 32.2 pg (ref 26.0–34.0)
MCHC: 34.3 g/dL (ref 30.0–36.0)
MCV: 93.8 fL (ref 80.0–100.0)
Monocytes Absolute: 0.4 10*3/uL (ref 0.1–1.0)
Monocytes Relative: 1 %
Neutro Abs: 3.7 10*3/uL (ref 1.7–7.7)
Neutrophils Relative %: 10 %
Platelets: 123 10*3/uL — ABNORMAL LOW (ref 150–400)
RBC: 4.82 MIL/uL (ref 4.22–5.81)
RDW: 13.1 % (ref 11.5–15.5)
WBC Morphology: >10
WBC: 38 10*3/uL — ABNORMAL HIGH (ref 4.0–10.5)
nRBC: 0 % (ref 0.0–0.2)

## 2022-07-31 LAB — TROPONIN I (HIGH SENSITIVITY): Troponin I (High Sensitivity): 80 ng/L — ABNORMAL HIGH (ref <18)

## 2022-07-31 LAB — LIPID PANEL
Cholesterol: 166 mg/dL (ref 0–200)
HDL: 45 mg/dL (ref >40)
LDL Cholesterol: 99 mg/dL (ref 0–99)
Total CHOL/HDL Ratio: 3.7 RATIO
Triglycerides: 109 mg/dL (ref <150)
VLDL: 22 mg/dL (ref 0–40)

## 2022-07-31 LAB — HEPARIN LEVEL (UNFRACTIONATED): Heparin Unfractionated: 0.18 IU/mL — ABNORMAL LOW (ref 0.30–0.70)

## 2022-07-31 LAB — POCT ACTIVATED CLOTTING TIME: Activated Clotting Time: 287 seconds

## 2022-07-31 LAB — HEMOGLOBIN A1C
Hgb A1c MFr Bld: 5.7 % — ABNORMAL HIGH (ref 4.8–5.6)
Mean Plasma Glucose: 116.89 mg/dL

## 2022-07-31 LAB — PROTIME-INR
INR: 1.1 (ref 0.8–1.2)
Prothrombin Time: 14.4 seconds (ref 11.4–15.2)

## 2022-07-31 LAB — SURGICAL PCR SCREEN
MRSA, PCR: POSITIVE — AB
Staphylococcus aureus: POSITIVE — AB

## 2022-07-31 LAB — ABO/RH: ABO/RH(D): A POS

## 2022-07-31 LAB — TYPE AND SCREEN
ABO/RH(D): A POS
Antibody Screen: NEGATIVE

## 2022-07-31 LAB — APTT: aPTT: 25 seconds (ref 24–36)

## 2022-07-31 LAB — SARS CORONAVIRUS 2 BY RT PCR: SARS Coronavirus 2 by RT PCR: NEGATIVE

## 2022-07-31 SURGERY — LEFT HEART CATH AND CORONARY ANGIOGRAPHY
Anesthesia: LOCAL

## 2022-07-31 SURGERY — CORONARY/GRAFT ACUTE MI REVASCULARIZATION
Anesthesia: LOCAL

## 2022-07-31 MED ORDER — LABETALOL HCL 5 MG/ML IV SOLN: 10.0000 mg | INTRAVENOUS | Status: AC | PRN

## 2022-07-31 MED ORDER — TRANEXAMIC ACID (OHS) BOLUS VIA INFUSION
15.0000 mg/kg | INTRAVENOUS | Status: DC
  Filled 2022-07-31: qty 1089

## 2022-07-31 MED ORDER — FENTANYL CITRATE (PF) 100 MCG/2ML IJ SOLN
INTRAMUSCULAR | Status: AC
  Filled 2022-07-31: qty 2

## 2022-07-31 MED ORDER — MUPIROCIN 2 % EX OINT
1.0000 | TOPICAL_OINTMENT | Freq: Two times a day (BID) | CUTANEOUS | Status: AC
  Administered 2022-07-31 – 2022-08-05 (×9): 1 via NASAL
  Filled 2022-07-31 (×3): qty 22

## 2022-07-31 MED ORDER — VERAPAMIL HCL 2.5 MG/ML IV SOLN
INTRAVENOUS | Status: DC | PRN
  Administered 2022-07-31: 10 mL via INTRA_ARTERIAL

## 2022-07-31 MED ORDER — CHLORHEXIDINE GLUCONATE CLOTH 2 % EX PADS
6.0000 | MEDICATED_PAD | Freq: Once | CUTANEOUS | Status: AC
  Administered 2022-07-31: 6 via TOPICAL

## 2022-07-31 MED ORDER — PLASMA-LYTE A IV SOLN
INTRAVENOUS | Status: DC
  Filled 2022-07-31 (×2): qty 2.5

## 2022-07-31 MED ORDER — VANCOMYCIN HCL 1250 MG/250ML IV SOLN
1250.0000 mg | INTRAVENOUS | Status: DC
  Filled 2022-07-31 (×2): qty 250

## 2022-07-31 MED ORDER — MIDAZOLAM HCL 2 MG/2ML IJ SOLN
INTRAMUSCULAR | Status: AC
  Filled 2022-07-31: qty 2

## 2022-07-31 MED ORDER — TRANEXAMIC ACID 1000 MG/10ML IV SOLN
1.5000 mg/kg/h | INTRAVENOUS | Status: DC
  Filled 2022-07-31 (×2): qty 25

## 2022-07-31 MED ORDER — INSULIN REGULAR(HUMAN) IN NACL 100-0.9 UT/100ML-% IV SOLN
INTRAVENOUS | Status: DC
  Filled 2022-07-31 (×2): qty 100

## 2022-07-31 MED ORDER — ATORVASTATIN CALCIUM 80 MG PO TABS
80.0000 mg | ORAL_TABLET | Freq: Every day | ORAL | Status: DC
  Administered 2022-07-31 – 2022-08-06 (×6): 80 mg via ORAL
  Filled 2022-07-31 (×6): qty 1

## 2022-07-31 MED ORDER — SODIUM CHLORIDE 0.9 % IV SOLN: INTRAVENOUS | Status: DC

## 2022-07-31 MED ORDER — SODIUM CHLORIDE 0.9 % IV SOLN: 250.0000 mL | INTRAVENOUS | Status: DC | PRN

## 2022-07-31 MED ORDER — LIDOCAINE HCL (PF) 1 % IJ SOLN
INTRAMUSCULAR | Status: AC
  Filled 2022-07-31: qty 30

## 2022-07-31 MED ORDER — POTASSIUM CHLORIDE 2 MEQ/ML IV SOLN
80.0000 meq | INTRAVENOUS | Status: DC
  Filled 2022-07-31 (×2): qty 40

## 2022-07-31 MED ORDER — MORPHINE SULFATE (PF) 2 MG/ML IV SOLN: 2.0000 mg | INTRAVENOUS | Status: DC | PRN

## 2022-07-31 MED ORDER — ACETAMINOPHEN 325 MG PO TABS: 650.0000 mg | ORAL_TABLET | ORAL | Status: DC | PRN

## 2022-07-31 MED ORDER — ASPIRIN 81 MG PO CHEW
81.0000 mg | CHEWABLE_TABLET | Freq: Every day | ORAL | Status: DC
  Administered 2022-07-31: 81 mg via ORAL
  Filled 2022-07-31: qty 1

## 2022-07-31 MED ORDER — CEFAZOLIN SODIUM-DEXTROSE 2-4 GM/100ML-% IV SOLN
2.0000 g | INTRAVENOUS | Status: DC
  Filled 2022-07-31 (×2): qty 100

## 2022-07-31 MED ORDER — EPINEPHRINE HCL 5 MG/250ML IV SOLN IN NS
0.0000 ug/min | INTRAVENOUS | Status: DC
  Filled 2022-07-31 (×2): qty 250

## 2022-07-31 MED ORDER — LIDOCAINE HCL (PF) 1 % IJ SOLN
INTRAMUSCULAR | Status: DC | PRN
  Administered 2022-07-31: 2 mL

## 2022-07-31 MED ORDER — MANNITOL 20 % IV SOLN
INTRAVENOUS | Status: DC
  Filled 2022-07-31 (×2): qty 13

## 2022-07-31 MED ORDER — ONDANSETRON HCL 4 MG/2ML IJ SOLN: 4.0000 mg | Freq: Four times a day (QID) | INTRAMUSCULAR | Status: DC | PRN

## 2022-07-31 MED ORDER — TAMSULOSIN HCL 0.4 MG PO CAPS
0.4000 mg | ORAL_CAPSULE | Freq: Every day | ORAL | Status: DC
  Administered 2022-07-31 – 2022-08-05 (×6): 0.4 mg via ORAL
  Filled 2022-07-31 (×6): qty 1

## 2022-07-31 MED ORDER — HEPARIN 30,000 UNITS/1000 ML (OHS) CELLSAVER SOLUTION
Status: DC
  Filled 2022-07-31 (×2): qty 1000

## 2022-07-31 MED ORDER — SODIUM CHLORIDE 0.9% FLUSH
3.0000 mL | Freq: Two times a day (BID) | INTRAVENOUS | Status: DC
  Administered 2022-07-31: 3 mL via INTRAVENOUS

## 2022-07-31 MED ORDER — MILRINONE LACTATE IN DEXTROSE 20-5 MG/100ML-% IV SOLN
0.3000 ug/kg/min | INTRAVENOUS | Status: DC
  Filled 2022-07-31 (×2): qty 100

## 2022-07-31 MED ORDER — HYDRALAZINE HCL 20 MG/ML IJ SOLN: 10.0000 mg | INTRAMUSCULAR | Status: AC | PRN

## 2022-07-31 MED ORDER — DIAZEPAM 2 MG PO TABS
2.0000 mg | ORAL_TABLET | Freq: Once | ORAL | Status: AC
  Administered 2022-08-01: 2 mg via ORAL
  Filled 2022-07-31: qty 1

## 2022-07-31 MED ORDER — MIDAZOLAM HCL 2 MG/2ML IJ SOLN
INTRAMUSCULAR | Status: DC | PRN
  Administered 2022-07-31 (×2): 1 mg via INTRAVENOUS

## 2022-07-31 MED ORDER — METOPROLOL TARTRATE 12.5 MG HALF TABLET: 12.5000 mg | ORAL_TABLET | Freq: Once | ORAL | Status: DC

## 2022-07-31 MED ORDER — FENTANYL CITRATE (PF) 100 MCG/2ML IJ SOLN
INTRAMUSCULAR | Status: DC | PRN
  Administered 2022-07-31: 25 ug via INTRAVENOUS

## 2022-07-31 MED ORDER — NITROGLYCERIN 1 MG/10 ML FOR IR/CATH LAB
INTRA_ARTERIAL | Status: AC
  Filled 2022-07-31: qty 10

## 2022-07-31 MED ORDER — HEPARIN (PORCINE) IN NACL 1000-0.9 UT/500ML-% IV SOLN
INTRAVENOUS | Status: DC | PRN
  Administered 2022-07-31 (×2): 500 mL

## 2022-07-31 MED ORDER — CHLORHEXIDINE GLUCONATE 0.12 % MT SOLN
15.0000 mL | Freq: Once | OROMUCOSAL | Status: AC
  Administered 2022-08-01: 15 mL via OROMUCOSAL
  Filled 2022-07-31: qty 15

## 2022-07-31 MED ORDER — METOPROLOL TARTRATE 12.5 MG HALF TABLET
12.5000 mg | ORAL_TABLET | Freq: Two times a day (BID) | ORAL | Status: DC
  Administered 2022-07-31: 12.5 mg via ORAL
  Filled 2022-07-31: qty 1

## 2022-07-31 MED ORDER — HEPARIN SODIUM (PORCINE) 1000 UNIT/ML IJ SOLN
INTRAMUSCULAR | Status: DC | PRN
  Administered 2022-07-31: 4000 [IU] via INTRAVENOUS

## 2022-07-31 MED ORDER — NITROGLYCERIN IN D5W 200-5 MCG/ML-% IV SOLN
INTRAVENOUS | Status: AC | PRN
  Administered 2022-07-31: 5 ug/min via INTRAVENOUS

## 2022-07-31 MED ORDER — DEXMEDETOMIDINE HCL IN NACL 400 MCG/100ML IV SOLN
0.1000 ug/kg/h | INTRAVENOUS | Status: DC
  Filled 2022-07-31 (×2): qty 100

## 2022-07-31 MED ORDER — PHENYLEPHRINE HCL-NACL 20-0.9 MG/250ML-% IV SOLN
30.0000 ug/min | INTRAVENOUS | Status: DC
  Filled 2022-07-31 (×2): qty 250

## 2022-07-31 MED ORDER — SODIUM CHLORIDE 0.9 % IV SOLN: INTRAVENOUS | Status: AC

## 2022-07-31 MED ORDER — PANTOPRAZOLE SODIUM 40 MG PO TBEC: 40.0000 mg | DELAYED_RELEASE_TABLET | Freq: Every day | ORAL | Status: DC

## 2022-07-31 MED ORDER — NOREPINEPHRINE 4 MG/250ML-% IV SOLN
0.0000 ug/min | INTRAVENOUS | Status: DC
  Filled 2022-07-31 (×2): qty 250

## 2022-07-31 MED ORDER — HEPARIN (PORCINE) IN NACL 1000-0.9 UT/500ML-% IV SOLN
INTRAVENOUS | Status: AC
  Filled 2022-07-31: qty 1000

## 2022-07-31 MED ORDER — IOHEXOL 350 MG/ML SOLN
INTRAVENOUS | Status: DC | PRN
  Administered 2022-07-31: 70 mL

## 2022-07-31 MED ORDER — NITROGLYCERIN IN D5W 200-5 MCG/ML-% IV SOLN
INTRAVENOUS | Status: AC
  Filled 2022-07-31: qty 250

## 2022-07-31 MED ORDER — HEPARIN SODIUM (PORCINE) 5000 UNIT/ML IJ SOLN
4000.0000 [IU] | Freq: Once | INTRAMUSCULAR | Status: AC
  Administered 2022-07-31: 4000 [IU] via INTRAVENOUS
  Filled 2022-07-31: qty 1

## 2022-07-31 MED ORDER — HEPARIN SODIUM (PORCINE) 1000 UNIT/ML IJ SOLN
INTRAMUSCULAR | Status: AC
  Filled 2022-07-31: qty 10

## 2022-07-31 MED ORDER — FINASTERIDE 5 MG PO TABS
5.0000 mg | ORAL_TABLET | Freq: Every day | ORAL | Status: DC
  Administered 2022-08-02 – 2022-08-06 (×5): 5 mg via ORAL
  Filled 2022-07-31 (×5): qty 1

## 2022-07-31 MED ORDER — BISACODYL 5 MG PO TBEC: 5.0000 mg | DELAYED_RELEASE_TABLET | Freq: Once | ORAL | Status: DC

## 2022-07-31 MED ORDER — OXYCODONE HCL 5 MG PO TABS: 5.0000 mg | ORAL_TABLET | ORAL | Status: DC | PRN

## 2022-07-31 MED ORDER — TRANEXAMIC ACID (OHS) PUMP PRIME SOLUTION
2.0000 mg/kg | INTRAVENOUS | Status: DC
  Filled 2022-07-31 (×2): qty 1.45

## 2022-07-31 MED ORDER — HEPARIN (PORCINE) 25000 UT/250ML-% IV SOLN
1050.0000 [IU]/h | INTRAVENOUS | Status: DC
  Administered 2022-07-31: 900 [IU]/h via INTRAVENOUS
  Filled 2022-07-31: qty 250

## 2022-07-31 MED ORDER — CHLORHEXIDINE GLUCONATE CLOTH 2 % EX PADS
6.0000 | MEDICATED_PAD | Freq: Once | CUTANEOUS | Status: AC
  Administered 2022-08-01: 6 via TOPICAL

## 2022-07-31 MED ORDER — SODIUM CHLORIDE 0.9% FLUSH: 3.0000 mL | INTRAVENOUS | Status: DC | PRN

## 2022-07-31 SURGICAL SUPPLY — 15 items
BAND ZEPHYR COMPRESS 30 LONG (HEMOSTASIS) IMPLANT
CATH EXPO 5FR ANG PIGTAIL 145 (CATHETERS) IMPLANT
CATH INFINITI JR4 5F (CATHETERS) IMPLANT
CATH VISTA GUIDE 6FR XB3.5 (CATHETERS) IMPLANT
ELECT DEFIB PAD ADLT CADENCE (PAD) IMPLANT
GLIDESHEATH SLEND SS 6F .021 (SHEATH) IMPLANT
GUIDEWIRE INQWIRE 1.5J.035X260 (WIRE) IMPLANT
INQWIRE 1.5J .035X260CM (WIRE) ×1
KIT ENCORE 26 ADVANTAGE (KITS) IMPLANT
KIT HEART LEFT (KITS) ×1 IMPLANT
KIT HEMO VALVE WATCHDOG (MISCELLANEOUS) IMPLANT
PACK CARDIAC CATHETERIZATION (CUSTOM PROCEDURE TRAY) ×1 IMPLANT
TRANSDUCER W/STOPCOCK (MISCELLANEOUS) ×1 IMPLANT
TUBING CIL FLEX 10 FLL-RA (TUBING) ×1 IMPLANT
WIRE COUGAR XT STRL 190CM (WIRE) IMPLANT

## 2022-07-31 NOTE — Progress Notes (Signed)
Echocardiogram 2D Echocardiogram has been performed.  Oneal Deputy Avamarie Crossley RDCS 07/31/2022, 2:38 PM

## 2022-07-31 NOTE — Progress Notes (Signed)
[facility] for heparin Indication: chest pain/ACS  No Known Allergies  Patient Measurements: Height: '5\' 9"'$  (175.3 cm) Weight: 72.6 kg (160 lb) IBW/kg (Calculated) : 70.7 Heparin Dosing Weight: 72kg  Vital Signs: BP: 117/67 (11/07 2248) Pulse Rate: 54 (11/07 2300)  Labs: Recent Labs    07/31/22 0759 07/31/22 2157  HGB 15.5  --   HCT 45.2  --   PLT 123*  --   APTT 25  --   LABPROT 14.4  --   INR 1.1  --   HEPARINUNFRC  --  0.18*  CREATININE 1.04  --   TROPONINIHS 80*  --      Estimated Creatinine Clearance: 55.7 mL/min (by C-G formula based on SCr of 1.04 mg/dL).   Medical History: Past Medical History:  Diagnosis Date   CLL (chronic lymphocytic leukemia) ([place])    Diverticulitis    Gout    Hypercholesteremia    Hypertension    Prostate disorder      Assessment: 46 yoM admitted as a code STEMI with mvCAD. Heparin to begin 2h after radial band removed. Discussed with RN - will be removed ~1230. CBC wnl on admit, no AC prior to admission.  11/7 PM update:  Heparin level low  CABG tomorrow   Goal of Therapy:  Heparin level 0.3-0.7 units/ml Monitor platelets by anticoagulation protocol: Yes   Plan:  Inc heparin to 1050 units/hr F/U post-CABG  Narda Bonds, PharmD, BCPS Clinical Pharmacist Phone: [phone]

## 2022-07-31 NOTE — H&P (Addendum)
Cardiology Admission History and Physical   Patient ID: Corey [name] MRN: [id number]; DOB: December 19, 1940   Admission date: 07/31/2022  PCP:  Christain Sacramento, MD   [facility] Prov[facility] Cardiologist:  New Dr. Burt Knack  Chief Complaint:  Chest pain  Patient Profile:   Corey [name] is a 81 y.o. male with PMH of CLL, HTN, HLD who is being seen 07/31/2022 for the evaluation of chest pain/STEMI.  History of Present Illness:   [name] is an 81 yo male with PMH noted above. He has been followed by Dr. Irene Limbo through the cancer center at [facility] for CLL. Has been stage 0 with no significant signs of progression at his last office visit on 05/23/22. Hx of mild thrombocytopenia but stable counts. He has never been seen by a cardiologist in the past. Does report family hx of CAD with father needing CABG in his 30s but passed before the surgery could be done.   Reports having an episode of chest pain several days prior to presenting to the ED while outside walking his dog. This was brief in nature but associated shortness of breath.   This morning he was outside with his dog walking when he developed centralized chest pain with radiation into bilateral arms with shortness of breath. Symptoms were quite severe and prompted him to call EMS. EKG in the field showed diffuse ST depression with ST elevation in aVR. Code STEMI was called in the field. He was given '324mg'$  ASA, SL nitro via EMS. On arrival his pain had mostly resolved. EKG showed slight improvement in ST depression. Of note there was a simultaneous STEMI called which was transported to the cath lab emergently given ongoing chest pain. Patient was seen by Dr. Burt Knack in the ED and transported to the cath lab for further management. Given 4000 units/heparin in the ED.    Past Medical History:  Diagnosis Date   CLL (chronic lymphocytic leukemia) ([place])    Diverticulitis    Gout    Hypercholesteremia    Hypertension    Prostate disorder      Past Surgical History:  Procedure Laterality Date   APPENDECTOMY       Medications Prior to Admission: Prior to Admission medications   Medication Sig Start Date End Date Taking? Authorizing Provider  allopurinol (ZYLOPRIM) 300 MG tablet Take 300 mg by mouth daily.    [provider]  amLODipine (NORVASC) 5 MG tablet Take 5 mg by mouth daily.    [provider]  amoxicillin-clavulanate (AUGMENTIN) 875-125 MG tablet Take 1 tablet by mouth every 12 (twelve) hours. Patient not taking: Reported on 04/12/2021 05/23/20   Horton, Barbette Hair, MD  Cyanocobalamin (VITAMIN B 12) 500 MCG TABS Take 1 tablet by mouth daily.    [provider]  cyclobenzaprine (FLEXERIL) 5 MG tablet Take 1 tablet (5 mg total) by mouth 2 (two) times daily as needed for muscle spasms. Patient not taking: No sig reported 09/06/19   Lucrezia Starch, MD  finasteride (PROSCAR) 5 MG tablet Take 5 mg by mouth daily.    [provider]  HYDROcodone-acetaminophen (NORCO/VICODIN) 5-325 MG tablet Take 1 tablet by mouth every 6 (six) hours as needed. Patient not taking: No sig reported 05/23/20   Horton, Barbette Hair, MD  omeprazole (PRILOSEC) 40 MG capsule Take 40 mg by mouth daily.    [provider]  omeprazole (PRILOSEC) 40 MG capsule Take 40 mg by mouth daily. Patient not taking: Reported on  04/12/2021    [provider]  ondansetron (ZOFRAN ODT) 4 MG disintegrating tablet Take 1 tablet (4 mg total) by mouth every 8 (eight) hours as needed. Patient not taking: Reported on 04/12/2021 05/23/20   Horton, Barbette Hair, MD  pravastatin (PRAVACHOL) 40 MG tablet Take 20 mg by mouth daily.     [provider]  Tamsulosin HCl (FLOMAX) 0.4 MG CAPS Take 0.4 mg by mouth daily after supper.     [provider]  tiZANidine (ZANAFLEX) 2 MG tablet Take 1 tablet (2 mg total) by mouth every 6 (six) hours as needed for muscle spasms. Patient not taking: No sig reported 06/30/19    Charlesetta Shanks, MD     Allergies:   No Known Allergies  Social History:   Social History   Socioeconomic History   Marital status: Divorced    Spouse name: Not on file   Number of children: Not on file   Years of education: Not on file   Highest education level: Not on file  Occupational History   Not on file  Tobacco Use   Smoking status: Never   Smokeless tobacco: Never  Vaping Use   Vaping Use: Never used  Substance and Sexual Activity   Alcohol use: Yes    Comment: occasional   Drug use: No   Sexual activity: Not on file  Other Topics Concern   Not on file  Social History Narrative   Not on file   Social Determinants of Health   Financial Resource Strain: Not on file  Food Insecurity: Not on file  Transportation Needs: Not on file  Physical Activity: Not on file  Stress: Not on file  Social Connections: Not on file  Intimate Partner Violence: Not on file    Family History:   [name] family history includes Heart attack in his father; Hypertension in his father.    ROS:  Please see the history of present illness.  All other ROS reviewed and negative.     Physical Exam/Data:   Vitals:   07/31/22 0747 07/31/22 0807 07/31/22 0821  BP: (!) 147/84    Pulse: 60    Resp: 14    Temp: 97.8 F (36.6 C)    TempSrc: Oral    SpO2: 96%  99%  Weight:  72.6 kg   Height:  '5\' 9"'$  (1.753 m)    No intake or output data in the 24 hours ending 07/31/22 0847    07/31/2022    8:07 AM 05/23/2022    1:01 PM 04/12/2021    2:17 PM  Last 3 Weights  Weight (lbs) 160 lb 163 lb 3.2 oz 166 lb 12.8 oz  Weight (kg) 72.576 kg 74.027 kg 75.66 kg     Body mass index is 23.63 kg/m.  General:  Well nourished, well developed, in no acute distress HEENT: normal Neck: no JVD Vascular: No carotid bruits; Distal pulses 2+ bilaterally   Cardiac:  normal S1, S2; RRR; no murmur  Lungs:  clear to auscultation bilaterally, no wheezing, rhonchi or rales  Abd: soft, nontender, no  hepatomegaly  Ext: no edema Musculoskeletal:  No deformities, BUE and BLE strength normal and equal Skin: warm and dry  Neuro:  CNs 2-12 intact, no focal abnormalities noted Psych:  Normal affect    EKG:  The ECG that was done 11/7 was personally reviewed and demonstrates Sinus bradycardia with diffuse ST depression, ST elevation in aVR  Relevant CV Studies:  N/a   Laboratory Data:  High Sensitivity Troponin:  No results for input(s): "TROPONINIHS" in the last 720 hours.    ChemistryNo results for input(s): "NA", "K", "CL", "CO2", "GLUCOSE", "BUN", "CREATININE", "CALCIUM", "MG", "GFRNONAA", "GFRAA", "ANIONGAP" in the last 168 hours.  No results for input(s): "PROT", "ALBUMIN", "AST", "ALT", "ALKPHOS", "BILITOT" in the last 168 hours. Lipids No results for input(s): "CHOL", "TRIG", "HDL", "LABVLDL", "LDLCALC", "CHOLHDL" in the last 168 hours. Hematology Recent Labs  Lab 07/31/22 0759  WBC 38.0*  RBC 4.82  HGB 15.5  HCT 45.2  MCV 93.8  MCH 32.2  MCHC 34.3  RDW 13.1  PLT 123*   Thyroid No results for input(s): "TSH", "FREET4" in the last 168 hours. BNPNo results for input(s): "BNP", "PROBNP" in the last 168 hours.  DDimer No results for input(s): "DDIMER" in the last 168 hours.   Radiology/Studies:  No results found.   Assessment and Plan:   Corey [name] is a 81 y.o. male with PMH of CLL, HTN, HLD who is being seen 07/31/2022 for the evaluation of chest pain/STEMI.  Chest pain Abnormal EKG (STEMI equivalent)  -- presented with chest pain that started around 6am while walking his dog. EKG with diffuse ST depression and ST elevation in aVR. Given 324 ASA, SL nitro and 4000 units of IV heparin. Overall pain free at the time of interview.  -- brought to the cath lab for emergent cardiac cath -- further recommendations pending cath -- echo  HTN -- blood pressures stable in the ED -- PTA meds include norvasc '5mg'$  daily  -- further adjustments post cath  HLD -- on  pravastatin PTA -- check lipids/Hgb A1c -- start atorvastatin '80mg'$  daily  CLL -- has been followed by Dr. Irene Limbo  -- overall stable, no significant disease progression at last office visit -- last WBC 35, platelet 123  Risk Assessment/Risk Scores:   TIMI Risk Score for ST  Elevation MI:   [name] TIMI risk score is 3, which indicates a 4.4% risk of all cause mortality at 30 days.  Severity of Illness: The appropriate patient status for this patient is INPATIENT. Inpatient status is judged to be reasonable and necessary in order to provide the required intensity of service to ensure the patient's safety. [name] presenting symptoms, physical exam findings, and initial radiographic and laboratory data in the context of their chronic comorbidities is felt to place them at high risk for further clinical deterioration. Furthermore, it is not anticipated that the patient will be medically stable for discharge from the hospital within 2 midnights of admission.   * I certify that at the point of admission it is my clinical judgment that the patient will require inpatient hospital care spanning beyond 2 midnights from the point of admission due to high intensity of service, high risk for further deterioration and high frequency of surveillance required.*   For questions or updates, please contact [facility] [place] Please consult [email or web address] for contact info under     Signed, Reino Bellis, NP  07/31/2022 8:47 AM   Patient seen, examined. Available data reviewed. Agree with findings, assessment, and plan as outlined by Reino Bellis, NP.  The patient is examined in the emergency department.  As outlined above, he developed severe substernal chest pain this morning while walking his dog.  EMS activated a code STEMI because he had deep ST segment depression inferiorly and anterolaterally.  His pain has eased up after receiving IV heparin and his EKG changes have not fully resolved  but they  have improved.  On examination, he is alert, oriented, in no distress.  HEENT is normal, JVP is normal, carotid upstrokes are normal without bruits, lungs are clear bilaterally, heart sounds are distant but regular rate and rhythm with no murmur or gallop, abdomen is soft and nontender with no obvious masses, lower extremities have no edema.  Skin is warm and dry with no rash.  EKG shows sinus rhythm with marked ST depression consider acute posterior infarction versus severe subendocardial ischemia.  Emergency cardiac catheterization and possible PCI are indicated.  I have reviewed the risks, indications, and alternatives to cardiac catheterization and possible PCI with the patient who provides full informed consent for the procedure.  He has received IV heparin.  Will adjust his medical therapy based on findings of cardiac catheterization.  Of note, the patient has CLL and he has followed regularly at the cancer center with no significant abnormalities of his other blood lines.  He has not had any specific treatment for CLL other than observation at this point.  He has a good functional capacity and lives independently.  Sherren Mocha, M.D. 07/31/2022 9:24 AM

## 2022-07-31 NOTE — ED Triage Notes (Signed)
Pt arrives via [facility] for CODE STEMI, called by Dr. Maylon Peppers. Initially pt had diffuse ST depression. EMS reports pt progressed into ST elevation in leads V1 and V2. Given 324  mg ASA and 0.4 mg SL NTG PTA.

## 2022-07-31 NOTE — Progress Notes (Signed)
ANTICOAGULATION CONSULT NOTE  Pharmacy Consult for heparin Indication: chest pain/ACS  No Known Allergies  Patient Measurements: Height: '5\' 9"'$  (175.3 cm) Weight: 72.6 kg (160 lb) IBW/kg (Calculated) : 70.7 Heparin Dosing Weight: 72kg  Vital Signs: Temp: 97.8 F (36.6 C) (11/07 0747) Temp Source: Oral (11/07 0747) BP: 141/79 (11/07 1145) Pulse Rate: 68 (11/07 1145)  Labs: Recent Labs    07/31/22 0759  HGB 15.5  HCT 45.2  PLT 123*  APTT 25  LABPROT 14.4  INR 1.1  CREATININE 1.04  TROPONINIHS 80*    Estimated Creatinine Clearance: 55.7 mL/min (by C-G formula based on SCr of 1.04 mg/dL).   Medical History: Past Medical History:  Diagnosis Date   CLL (chronic lymphocytic leukemia) ([place])    Diverticulitis    Gout    Hypercholesteremia    Hypertension    Prostate disorder      Assessment: 12 yoM admitted as a code STEMI with mvCAD. Heparin to begin 2h after radial band removed. Discussed with RN - will be removed ~1230. CBC wnl on admit, no AC prior to admission.  Goal of Therapy:  Heparin level 0.3-0.7 units/ml Monitor platelets by anticoagulation protocol: Yes   Plan:  Start heparin 900 units/h no bolus at 1430 Check heparin level 8h after starting  Arrie Senate, PharmD, BCPS, [facility] Clinical Pharmacist [phone] Please check AMION for all [place] numbers 07/31/2022

## 2022-07-31 NOTE — ED Provider Notes (Signed)
[facility] EMERGENCY DEPARTMENT Provider Note   CSN: [id number] Arrival date & time: 07/31/22  5537     History  Chief Complaint  Patient presents with   Code STEMI    Corey [name] is a 81 y.o. male.  Patient is an 81 year old male with a past medical history of hypertension, CLL not currently on treatment, gout presenting to the emergency department with chest pain as a code STEMI.  The patient reports that this morning he got up and took his dog outside to use the bathroom when he started to develop midsternal pressure type of pain that radiated to his bilateral armpits.  He states that he felt short of breath and got mildly diaphoretic and nauseous but denies any vomiting.  He states that he always feels lightheaded and did not feel more lightheaded than usual.  He states that something similar happened about 2 days ago but he states that pain went away on its own after a few seconds whereas today the pain persisted.  On medics arrival, the patient was hemodynamically stable but did have concern for inferior septal ST depressions with mild elevation in aVR and V1 and a code STEMI was called.  Medics did give the patient 324 mg of aspirin and nitro with improvement of his pain.  The history is provided by the patient and the EMS personnel.       Home Medications Prior to Admission medications   Medication Sig Start Date End Date Taking? Authorizing Provider  allopurinol (ZYLOPRIM) 300 MG tablet Take 300 mg by mouth daily.    [provider]  amLODipine (NORVASC) 5 MG tablet Take 5 mg by mouth daily.    [provider]  amoxicillin-clavulanate (AUGMENTIN) 875-125 MG tablet Take 1 tablet by mouth every 12 (twelve) hours. Patient not taking: Reported on 04/12/2021 05/23/20   Horton, Barbette Hair, MD  Cyanocobalamin (VITAMIN B 12) 500 MCG TABS Take 1 tablet by mouth daily.    [provider]  cyclobenzaprine (FLEXERIL) 5 MG tablet Take 1  tablet (5 mg total) by mouth 2 (two) times daily as needed for muscle spasms. Patient not taking: No sig reported 09/06/19   Lucrezia Starch, MD  finasteride (PROSCAR) 5 MG tablet Take 5 mg by mouth daily.    [provider]  HYDROcodone-acetaminophen (NORCO/VICODIN) 5-325 MG tablet Take 1 tablet by mouth every 6 (six) hours as needed. Patient not taking: No sig reported 05/23/20   Horton, Barbette Hair, MD  omeprazole (PRILOSEC) 40 MG capsule Take 40 mg by mouth daily.    [provider]  omeprazole (PRILOSEC) 40 MG capsule Take 40 mg by mouth daily. Patient not taking: Reported on 04/12/2021    [provider]  ondansetron (ZOFRAN ODT) 4 MG disintegrating tablet Take 1 tablet (4 mg total) by mouth every 8 (eight) hours as needed. Patient not taking: Reported on 04/12/2021 05/23/20   Horton, Barbette Hair, MD  pravastatin (PRAVACHOL) 40 MG tablet Take 20 mg by mouth daily.     [provider]  Tamsulosin HCl (FLOMAX) 0.4 MG CAPS Take 0.4 mg by mouth daily after supper.     [provider]  tiZANidine (ZANAFLEX) 2 MG tablet Take 1 tablet (2 mg total) by mouth every 6 (six) hours as needed for muscle spasms. Patient not taking: No sig reported 06/30/19   Charlesetta Shanks, MD      Allergies    Patient has no known allergies.    Review  of Systems   Review of Systems  Physical Exam Updated Vital Signs BP (!) 147/84 (BP Location: Left Arm)   Pulse 60   Temp 97.8 F (36.6 C) (Oral)   Resp 14   Ht '5\' 9"'$  (1.753 m)   Wt 72.6 kg   SpO2 99%   BMI 23.63 kg/m  Physical Exam Vitals and nursing note reviewed.  Constitutional:      General: He is not in acute distress.    Appearance: Normal appearance. He is diaphoretic (Mildly).  HENT:     Head: Normocephalic and atraumatic.     Nose: Nose normal.     Mouth/Throat:     Mouth: Mucous membranes are moist.     Pharynx: Oropharynx is clear.  Eyes:     Extraocular Movements: Extraocular movements intact.      Conjunctiva/sclera: Conjunctivae normal.  Cardiovascular:     Rate and Rhythm: Normal rate and regular rhythm.     Pulses: Normal pulses.     Heart sounds: Normal heart sounds.  Pulmonary:     Effort: Pulmonary effort is normal.     Breath sounds: Normal breath sounds.  Abdominal:     General: Abdomen is flat.     Palpations: Abdomen is soft.     Tenderness: There is abdominal tenderness (Minimal left lower quadrant). There is no guarding or rebound.  Musculoskeletal:        General: Normal range of motion.     Cervical back: Normal range of motion and neck supple.     Right lower leg: No edema.     Left lower leg: No edema.  Skin:    General: Skin is warm.  Neurological:     General: No focal deficit present.     Mental Status: He is alert and oriented to person, place, and time.  Psychiatric:        Mood and Affect: Mood normal.        Behavior: Behavior normal.     ED Results / Procedures / Treatments   Labs (all labs ordered are listed, but only abnormal results are displayed) Labs Reviewed  HEMOGLOBIN A1C  CBC WITH DIFFERENTIAL/PLATELET  PROTIME-INR  APTT  COMPREHENSIVE METABOLIC PANEL  LIPID PANEL  TROPONIN I (HIGH SENSITIVITY)    EKG EKG Interpretation  Date/Time:  Tuesday July 31 2022 07:49:53 EST Ventricular Rate:  53 PR Interval:  219 QRS Duration: 113 QT Interval:  474 QTC Calculation: 445 R Axis:   -75 Text Interpretation: Sinus rhythm Borderline prolonged PR interval Left anterior fascicular block Repol abnrm suggests ischemia, diffuse leads No previous ECGs available Confirmed by Leanord Asal ([id number]) on 07/31/2022 8:15:06 AM  Radiology No results found.  Procedures .Critical Care  Performed by: Kemper Durie, DO Authorized by: Kemper Durie, DO   Critical care provider statement:    Critical care time (minutes):  40   Critical care time was exclusive of:  Separately billable procedures and treating other patients    Critical care was necessary to treat or prevent imminent or life-threatening deterioration of the following conditions:  Cardiac failure   Critical care was time spent personally by me on the following activities:  Development of treatment plan with patient or surrogate, discussions with consultants, evaluation of patient's response to treatment, examination of patient, obtaining history from patient or surrogate, ordering and performing treatments and interventions, ordering and review of laboratory studies, ordering and review of radiographic studies, pulse oximetry, re-evaluation of patient's condition and review of  old charts   I assumed direction of critical care for this patient from another provider in my specialty: no     Care discussed with: admitting provider       Medications Ordered in ED Medications  0.9 %  sodium chloride infusion ( Intravenous New Bag/Given 07/31/22 0803)  midazolam (VERSED) injection (1 mg Intravenous Given 07/31/22 0825)  fentaNYL (SUBLIMAZE) injection (25 mcg Intravenous Given 07/31/22 0825)  lidocaine (PF) (XYLOCAINE) 1 % injection (2 mLs  Given 07/31/22 0825)  Radial Cocktail/Verapamil only (10 mLs Intra-arterial Given 07/31/22 0826)  Heparin (Porcine) in NaCl 1000-0.9 UT/500ML-% SOLN (500 mLs  Given 07/31/22 0827)  heparin sodium (porcine) injection (4,000 Units Intravenous Given 07/31/22 0827)  heparin injection 4,000 Units (4,000 Units Intravenous Given 07/31/22 0803)    ED Course/ Medical Decision Making/ A&P                           Medical Decision Making This patient presents to the ED with chief complaint(s) of chest pain with pertinent past medical history of hypertension, CLL, gout which further complicates the presenting complaint. The complaint involves an extensive differential diagnosis and also carries with it a high risk of complications and morbidity.    The differential diagnosis includes ACS, arrhythmia, anemia, pneumonia, pneumothorax,  pulmonary edema, pleural effusion, dissection less likely as patient has equal pulses in all 4 extremities and no neurologic deficits and is hemodynamically stable  Additional history obtained: Additional history obtained from EMS  Records reviewed outpatient oncology records  ED Course and Reassessment: Patient was called as a prehospital arrival STEMI alert.  He was given aspirin and nitro by medics in route.  On his arrival to the emergency department I was immediately present at bedside.  Patient was awake and alert in no acute distress and hemodynamically stable.  He was placed on the cardiac monitor and placed on ZOLL pads.  Repeat EKG was performed that did show some improvement of his ST depressions inferior septally.  Cardiology was immediately present at bedside and recommended admission to cardiac catheterization lab.  The patient was loaded with heparin bolus.  Independent labs interpretation:  The following labs were independently interpreted: pending  Independent visualization of imaging: pending  Consultation: - Consulted or discussed management/test interpretation w/ external professional: Cardiology  Consideration for admission or further workup: Patient requires emergent admission to the cardiac catheterization lab with cardiology for concern for STEMI Social Determinants of health: N/A    Amount and/or Complexity of Data Reviewed Labs: ordered. Radiology: ordered.  Risk Prescription drug management. Decision regarding hospitalization.          Final Clinical Impression(s) / ED Diagnoses Final diagnoses:  Acute ST elevation myocardial infarction (STEMI), unspecified artery [facility])    Rx / DC Orders ED Discharge Orders     None         Kemper Durie, DO 07/31/22 [phone]

## 2022-07-31 NOTE — Progress Notes (Signed)
Precabg has been completed.   Preliminary results in CV Proc.   Corey [name] 07/31/2022 5:09 PM

## 2022-07-31 NOTE — H&P (View-Only) (Signed)
Reason for Consult: Left main/two-vessel disease, acute non-STEMI  Referring Physician: Dr. Ilona Sorrel Corey [name] is an 81 y.o. male.  HPI: 81 year old man presented with chest pain rating to the arms.  Corey [name] is an 81 year old man with a history of hypertension, hyperlipidemia, CLL, gout, diverticulitis, and BPH.  He has no prior cardiac history.  He does have a family history of cardiac disease with his father dying while awaiting bypass surgery at age 32.  This morning he was walking his dog.  He developed a severe central chest pain with radiation to both arms associated with shortness of breath.  He called EMS.  EKG showed diffuse ST depression and ST elevation in aVR.  Code STEMI was called.  His pain was better by the time he arrived he was taken emergently to the catheterization laboratory by Dr. Burt Knack.  He was found to have 98- 70% left main stenosis, 70% LAD stenosis, and 95% ostial stenosis of the circumflex not amenable to percutaneous intervention.  No disease in a large dominant right coronary.  Echocardiogram showed an EF of 40 to 45% with aortic sclerosis but no stenosis.  No chest pain since catheterization.  Relatively active but does not exercise on a regular basis.  Had a couple of brief episodes of chest discomfort in the past several days prior to this morning's events.  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  Followed by Dr. Irene Limbo.  Not going under treatment.   Past Medical History:  Diagnosis Date   CLL (chronic lymphocytic leukemia) ([place])    Diverticulitis    Gout    Hypercholesteremia    Hypertension    Prostate disorder     Past Surgical History:  Procedure Laterality Date   APPENDECTOMY      Family History  Problem Relation Age of Onset   Hypertension Father    Heart attack Father     Social History:  reports that he has never smoked. He has never used smokeless tobacco. He reports current alcohol use. He reports that he does not use  drugs.  Allergies: No Known Allergies  Medications: Scheduled:  aspirin  81 mg Oral Daily   atorvastatin  80 mg Oral Daily   [START ON 08/01/2022] finasteride  5 mg Oral Daily   metoprolol tartrate  12.5 mg Oral BID   [START ON 08/01/2022] pantoprazole  40 mg Oral Daily   sodium chloride flush  3 mL Intravenous Q12H   tamsulosin  0.4 mg Oral QPC supper    Results for orders placed or performed during the hospital encounter of 07/31/22 (from the past 48 hour(s))  Hemoglobin A1c     Status: Abnormal   Collection Time: 07/31/22  7:59 AM  Result Value Ref Range   Hgb A1c MFr Bld 5.7 (H) 4.8 - 5.6 %    Comment: (NOTE) Pre diabetes:          5.7%-6.4%  Diabetes:              >6.4%  Glycemic control for   <7.0% adults with diabetes    Mean Plasma Glucose 116.89 mg/dL    Comment: Performed at [facility], [place] [address], [place], [place] [address]  CBC with Differential/Platelet     Status: Abnormal   Collection Time: 07/31/22  7:59 AM  Result Value Ref Range   WBC 38.0 (H) 4.0 - 10.5 K/uL   RBC 4.82 4.22 - 5.81 MIL/uL   Hemoglobin 15.5 13.0 -  17.0 g/dL   HCT 45.2 39.0 - 52.0 %   MCV 93.8 80.0 - 100.0 fL   MCH 32.2 26.0 - 34.0 pg   MCHC 34.3 30.0 - 36.0 g/dL   RDW 13.1 11.5 - 15.5 %   Platelets 123 (L) 150 - 400 K/uL   nRBC 0.0 0.0 - 0.2 %   Neutrophils Relative % 10 %   Neutro Abs 3.7 1.7 - 7.7 K/uL   Lymphocytes Relative 89 %   Lymphs Abs 33.6 (H) 0.7 - 4.0 K/uL   Monocytes Relative 1 %   Monocytes Absolute 0.4 0.1 - 1.0 K/uL   Eosinophils Relative 0 %   Eosinophils Absolute 0.1 0.0 - 0.5 K/uL   Basophils Relative 0 %   Basophils Absolute 0.1 0.0 - 0.1 K/uL   WBC Morphology >10% Reactive Benign Lymphoctyes    RBC Morphology MORPHOLOGY UNREMARKABLE    Immature Granulocytes 0 %   Abs Immature Granulocytes 0.05 0.00 - 0.07 K/uL    Comment: Performed at [facility] [address] [place], [place] [address]  Protime-INR     Status: None   Collection Time:  07/31/22  7:59 AM  Result Value Ref Range   Prothrombin Time 14.4 11.4 - 15.2 seconds   INR 1.1 0.8 - 1.2    Comment: (NOTE) INR goal varies based on device and disease states. Performed at [facility], [place] [address] [place], [place] [address]   APTT     Status: None   Collection Time: 07/31/22  7:59 AM  Result Value Ref Range   aPTT 25 24 - 36 seconds    Comment: Performed at [facility] [address], [place], [place] [address]  Comprehensive metabolic panel     Status: Abnormal   Collection Time: 07/31/22  7:59 AM  Result Value Ref Range   Sodium 140 135 - 145 mmol/L   Potassium 4.9 3.5 - 5.1 mmol/L   Chloride 106 98 - 111 mmol/L   CO2 24 22 - 32 mmol/L   Glucose, Bld 125 (H) 70 - 99 mg/dL    Comment: Glucose reference range applies only to samples taken after fasting for at least 8 hours.   BUN 9 8 - 23 mg/dL   Creatinine, Ser 1.04 0.61 - 1.24 mg/dL   Calcium 8.8 (L) 8.9 - 10.3 mg/dL   Total Protein 5.8 (L) 6.5 - 8.1 g/dL   Albumin 3.4 (L) 3.5 - 5.0 g/dL   AST 29 15 - 41 U/L   ALT 22 0 - 44 U/L   Alkaline Phosphatase 47 38 - 126 U/L   Total Bilirubin 1.2 0.3 - 1.2 mg/dL   GFR, Estimated >60 >60 mL/min    Comment: (NOTE) Calculated using the CKD-EPI Creatinine Equation (2021)    Anion gap 10 5 - 15    Comment: Performed at [facility], [place] [address] [place],  [address]  Troponin I (High Sensitivity)     Status: Abnormal   Collection Time: 07/31/22  7:59 AM  Result Value Ref Range   Troponin I (High Sensitivity) 80 (H) <18 ng/L    Comment: (NOTE) Elevated high sensitivity troponin I (hsTnI) values and significant  changes across serial measurements may suggest ACS but many other  chronic and acute conditions are known to elevate hsTnI results.  Refer to the "Links" section for chest pain algorithms and additional  guidance. Performed at [facility], [place],  [place] [address]   Lipid panel     Status: None    Collection Time: 07/31/22  7:59 AM  Result Value Ref Range   Cholesterol 166 0 - 200 mg/dL   Triglycerides 109 <150 mg/dL   HDL 45 >40 mg/dL   Total CHOL/HDL Ratio 3.7 RATIO   VLDL 22 0 - 40 mg/dL   LDL Cholesterol 99 0 - 99 mg/dL    Comment:        Total Cholesterol/HDL:CHD Risk Coronary Heart Disease Risk Table                     Men   Women  1/2 Average Risk   3.4   3.3  Average Risk       5.0   4.4  2 X Average Risk   9.6   7.1  3 X Average Risk  23.4   11.0        Use the calculated Patient Ratio above and the CHD Risk Table to determine the patient's CHD Risk.        ATP III CLASSIFICATION (LDL):  <100     mg/dL   Optimal  100-129  mg/dL   Near or Above                    Optimal  130-159  mg/dL   Borderline  160-189  mg/dL   High  >190     mg/dL   Very High Performed at [facility] [address], [place], [place] [address]   POCT Activated clotting time     Status: None   Collection Time: 07/31/22  8:33 AM  Result Value Ref Range   Activated Clotting Time 287 seconds    Comment: Reference range 74-137 seconds for patients not on anticoagulant therapy.    VAS US DOPPLER PRE CABG  Result Date: 07/31/2022 PREOPERATIVE VASCULAR EVALUATION Patient Name:  Corey [name] [facility]  Date of Exam:   07/31/2022 Medical Rec #: [id number]        Accession #:    [id number] Date of Birth: 11-06-1940        Patient Gender: M Patient Age:   47 years Exam Location:  [facility] Procedure:      VAS US DOPPLER PRE CABG Referring Phys: Remo Lipps Brandyn Lowrey --------------------------------------------------------------------------------  Indications:      Pre-CABG. Risk Factors:     Hypertension, hyperlipidemia, Diabetes. Comparison Study: no prior Performing Technologist: Archie Patten RVS  Examination Guidelines: A complete evaluation includes B-mode imaging, spectral Doppler, color Doppler, and power Doppler as needed of all accessible portions of each vessel. Bilateral testing  is considered an integral part of a complete examination. Limited examinations for reoccurring indications may be performed as noted.  Right Carotid Findings: +----------+--------+--------+--------+------------+--------+           PSV cm/sEDV cm/sStenosisDescribe    Comments +----------+--------+--------+--------+------------+--------+ CCA Prox  76      13              heterogenous         +----------+--------+--------+--------+------------+--------+ CCA Distal60      15              heterogenous         +----------+--------+--------+--------+------------+--------+ ICA Prox  59      17              heterogenous         +----------+--------+--------+--------+------------+--------+ ICA Distal49  9                                    +----------+--------+--------+--------+------------+--------+ ECA       72                                           +----------+--------+--------+--------+------------+--------+ +----------+--------+-------+--------+------------+           PSV cm/sEDV cmsDescribeArm Pressure +----------+--------+-------+--------+------------+ Subclavian88                                  +----------+--------+-------+--------+------------+ +---------+--------+--+--------+-+---------+ VertebralPSV cm/s25EDV cm/s8Antegrade +---------+--------+--+--------+-+---------+ Left Carotid Findings: +----------+--------+--------+--------+------------+--------+           PSV cm/sEDV cm/sStenosisDescribe    Comments +----------+--------+--------+--------+------------+--------+ CCA Prox  85      13              heterogenous         +----------+--------+--------+--------+------------+--------+ CCA Distal50      9               heterogenous         +----------+--------+--------+--------+------------+--------+ ICA Prox  51      18              heterogenous         +----------+--------+--------+--------+------------+--------+ ICA  Distal56      23                                   +----------+--------+--------+--------+------------+--------+ ECA       72                                           +----------+--------+--------+--------+------------+--------+ +----------+--------+--------+--------+------------+ SubclavianPSV cm/sEDV cm/sDescribeArm Pressure +----------+--------+--------+--------+------------+           85                                   +----------+--------+--------+--------+------------+ +---------+--------+--+--------+--+---------+ VertebralPSV cm/s27EDV cm/s11Antegrade +---------+--------+--+--------+--+---------+  ABI Findings: +--------+------------------+-----+---------+--------+ Right   Rt Pressure (mmHg)IndexWaveform Comment  +--------+------------------+-----+---------+--------+ Brachial                       triphasic         +--------+------------------+-----+---------+--------+ ATA                            triphasic         +--------+------------------+-----+---------+--------+ PTA                            triphasic         +--------+------------------+-----+---------+--------+ +--------+------------------+-----+---------+-------+ Left    Lt Pressure (mmHg)IndexWaveform Comment +--------+------------------+-----+---------+-------+ Brachial                       triphasic        +--------+------------------+-----+---------+-------+ ATA  triphasic        +--------+------------------+-----+---------+-------+ PTA                            triphasic        +--------+------------------+-----+---------+-------+  Right Doppler Findings: +--------+--------+-----+---------+--------+ Site    PressureIndexDoppler  Comments +--------+--------+-----+---------+--------+ Brachial             triphasic         +--------+--------+-----+---------+--------+ Radial               triphasic          +--------+--------+-----+---------+--------+ Ulnar                triphasic         +--------+--------+-----+---------+--------+  Left Doppler Findings: +--------+--------+-----+---------+--------+ Site    PressureIndexDoppler  Comments +--------+--------+-----+---------+--------+ Brachial             triphasic         +--------+--------+-----+---------+--------+ Radial               triphasic         +--------+--------+-----+---------+--------+ Ulnar                triphasic         +--------+--------+-----+---------+--------+   Summary: Right Carotid: The extracranial vessels were near-normal with only minimal wall                thickening or plaque. Left Carotid: The extracranial vessels were near-normal with only minimal wall               thickening or plaque. Vertebrals: Bilateral vertebral arteries demonstrate antegrade flow. Right Upper Extremity: Doppler waveforms remain within normal limits with right radial compression. Doppler waveforms decrease 50% with right ulnar compression. Left Upper Extremity: Doppler waveforms decrease 50% with left radial compression. Doppler waveforms remain within normal limits with left ulnar compression.  Electronically signed by Deitra Mayo MD on 07/31/2022 at 6:04:51 PM.    Final    DG Chest Port 1 View  Result Date: 07/31/2022 CLINICAL DATA:  Chest pain EXAM: PORTABLE CHEST 1 VIEW COMPARISON:  09/06/2019 FINDINGS: The heart size and mediastinal contours are within normal limits. Both lungs are clear. The visualized skeletal structures are unremarkable. IMPRESSION: No active disease. Electronically Signed   By: Elmer Picker M.D.   On: 07/31/2022 16:13   ECHOCARDIOGRAM COMPLETE  Result Date: 07/31/2022    ECHOCARDIOGRAM REPORT   Patient Name:   Corey [name] Date of Exam: 07/31/2022 Medical Rec #:  [id number]       Height:       69.0 in Accession #:    [id number]      Weight:       160.0 lb Date of Birth:  [birth year]        BSA:          1.879 m Patient Age:    22 years        BP:           147/71 mmHg Patient Gender: M               HR:           54 bpm. 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  STEMI; Risk Factors:Hypertension and Dyslipidemia.  Sonographer:    Raquel Sarna Senior RDCS Referring Phys: [place]  1. Left ventricular ejection fraction, by estimation, is 40 to 45%. The left ventricle has mildly decreased function. The left ventricle demonstrates regional wall motion abnormalities (see scoring diagram/findings for description). Left ventricular diastolic parameters are consistent with Grade I diastolic dysfunction (impaired relaxation). There is moderate hypokinesis of the left ventricular, entire anterolateral wall and inferolateral wall.  2. Right ventricular systolic function is normal. The right ventricular size is normal. Tricuspid regurgitation signal is inadequate for assessing PA pressure.  3. Left atrial size was mildly dilated.  4. A small pericardial effusion is present. The pericardial effusion is anterior to the right ventricle.  5. The mitral valve is normal in structure. No evidence of mitral valve regurgitation.  6. The aortic valve is tricuspid. There is mild calcification of the aortic valve. There is mild thickening of the aortic valve. Aortic valve regurgitation is not visualized. Aortic valve sclerosis/calcification is present, without any evidence of aortic stenosis.  7. The inferior vena cava is normal in size with greater than 50% respiratory variability, suggesting right atrial pressure of 3 mmHg. FINDINGS  Left Ventricle: Left ventricular ejection fraction, by estimation, is 40 to 45%. The left ventricle has mildly decreased function. The left ventricle demonstrates regional wall motion  abnormalities. Moderate hypokinesis of the left ventricular, entire anterolateral wall and inferolateral wall. The left ventricular internal cavity size was normal in size. There is no left ventricular hypertrophy. Left ventricular diastolic parameters are consistent with Grade I diastolic dysfunction (impaired relaxation). Normal left ventricular filling pressure. Right Ventricle: The right ventricular size is normal. No increase in right ventricular wall thickness. Right ventricular systolic function is normal. Tricuspid regurgitation signal is inadequate for assessing PA pressure. Left Atrium: Left atrial size was mildly dilated. Right Atrium: Right atrial size was normal in size. Pericardium: A small pericardial effusion is present. The pericardial effusion is anterior to the right ventricle. Mitral Valve: The mitral valve is normal in structure. No evidence of mitral valve regurgitation. Tricuspid Valve: The tricuspid valve is normal in structure. Tricuspid valve regurgitation is not demonstrated. Aortic Valve: The aortic valve is tricuspid. There is mild calcification of the aortic valve. There is mild thickening of the aortic valve. Aortic valve regurgitation is not visualized. Aortic valve sclerosis/calcification is present, without any evidence of aortic stenosis. Pulmonic Valve: The pulmonic valve was not well visualized. Pulmonic valve regurgitation is not visualized. Aorta: The aortic root and ascending aorta are structurally normal, with no evidence of dilitation. Venous: The inferior vena cava is normal in size with greater than 50% respiratory variability, suggesting right atrial pressure of 3 mmHg. IAS/Shunts: No atrial level shunt detected by color flow Doppler.  LEFT VENTRICLE PLAX 2D LVIDd:         4.20 cm   Diastology LVIDs:         2.80 cm   LV e' medial:    6.85 cm/s LV PW:         0.80 cm   LV E/e' medial:  10.4 LV IVS:        0.70 cm   LV e' lateral:   6.58 cm/s LVOT diam:     2.10 cm   LV  E/e' lateral: 10.8 LV SV:         69 LV SV Index:   36 LVOT Area:     3.46 cm  RIGHT VENTRICLE RV S  prime:     7.94 cm/s TAPSE (M-mode): 1.7 cm LEFT ATRIUM             Index        RIGHT ATRIUM           Index LA diam:        4.00 cm 2.13 cm/m   RA Area:     13.60 cm LA Vol (A2C):   37.6 ml 20.01 ml/m  RA Volume:   30.20 ml  16.07 ml/m LA Vol (A4C):   39.5 ml 21.02 ml/m LA Biplane Vol: 31.6 ml 16.82 ml/m  AORTIC VALVE LVOT Vmax:   94.00 cm/s LVOT Vmean:  60.800 cm/s LVOT VTI:    0.198 m  AORTA Ao Root diam: 3.70 cm Ao Asc diam:  3.50 cm MITRAL VALVE MV Area (PHT): 3.96 cm    SHUNTS MV Decel Time: 192 msec    Systemic VTI:  0.20 m MV E velocity: 70.90 cm/s  Systemic Diam: 2.10 cm MV A velocity: 95.15 cm/s MV E/A ratio:  0.75 Mihai Croitoru MD Electronically signed by Sanda Klein MD Signature Date/Time: 07/31/2022/4:01:37 PM    Final    CARDIAC CATHETERIZATION  Result Date: 07/31/2022   Ost LAD to Prox LAD lesion is 70% stenosed.   Mid LM to Ost LAD lesion is 70% stenosed.   Ost Cx to Prox Cx lesion is 95% stenosed.   The left ventricular systolic function is normal.   LV end diastolic pressure is normal.   The left ventricular ejection fraction is 55-65% by visual estimate. 1.  Thrombotic, ulcerated severe stenosis of the ostial/proximal circumflex (culprit lesion) 2.  Moderately severe calcific distal left main stenosis of 70% 3.  Moderately severe calcified proximal LAD stenosis of 70% 4.  Mild diffuse nonobstructive plaquing of a large, dominant RCA which supplies a large PDA branch and multiple posterolateral branches 5.  Normal LV systolic function with no regional wall motion abnormalities Recommendations: Difficult anatomy for PCI due to calcific distal left main and ostial LAD stenosis and severe ostial circumflex stenosis.  Cardiac surgery called and evaluated the patient's cath films with me while he was on the table.  We agree that he is best suited for coronary bypass surgery.  He will be  continued on heparin and will have formal cardiac surgical consultation.  At the completion of the procedure, the patient is chest pain-free and his ST segments have normalized.    I personally reviewed the cardiac catheterization images.  He has left main and two-vessel disease with tight ostial circumflex/intermediate lesion. Review of Systems Blood pressure (!) 152/72, pulse (!) 52, temperature 97.8 F (36.6 C), temperature source Oral, resp. rate 14, height '5\' 9"'$  (1.753 m), weight 72.6 kg, SpO2 94 %. Physical Exam Vitals reviewed.  Constitutional:      General: He is not in acute distress.    Appearance: Normal appearance.  HENT:     Head: Normocephalic and atraumatic.  Eyes:     General: No scleral icterus.    Extraocular Movements: Extraocular movements intact.  Neck:     Vascular: No carotid bruit.  Cardiovascular:     Rate and Rhythm: Normal rate and regular rhythm.     Pulses: Normal pulses.     Heart sounds: Normal heart sounds. No murmur heard.    No friction rub. No gallop.  Pulmonary:     Effort: Pulmonary effort is normal. No respiratory distress.     Breath sounds: Normal breath sounds.  No wheezing.  Abdominal:     General: There is no distension.     Palpations: Abdomen is soft.  Lymphadenopathy:     Cervical: No cervical adenopathy.  Skin:    General: Skin is warm and dry.  Neurological:     General: No focal deficit present.     Mental Status: He is alert and oriented to person, place, and time.     Cranial Nerves: No cranial nerve deficit.     Assessment/Plan: Corey [name] is an 81 year old man with a past history significant for hypertension, hyperlipidemia, CLL, gout, diverticulitis, and BPH.  He presented with unstable chest pain and ruled in for MI with a troponin of 80.  His pain improved with nitroglycerin and aspirin and heparin.  At catheterization he was found to have left main disease and a tight ostial circumflex stenosis not amenable to  percutaneous intervention.  Coronary artery bypass grafting is indicated for survival benefit and relief of symptoms.  I discussed with postoperative procedure with Corey [name].  We discussed the general nature of the procedure including the need for general anesthesia, the incisions to be used, the use of cardiopulmonary bypass, and the use of drainage tubes and temporary pacemaker wires postoperatively.  We discussed the expected hospital stay and overall recovery.  I informed him of the indications, risks, benefits, and alternatives.  He understands the risks include, but are not limited to death, MI, DVT, PE, bleeding, possible need for transfusion, infection, cardiac arrhythmias, respiratory or renal failure, as well as possibility of other unforeseeable complications.  He understands the palliative nature of the procedure and the potential for recurrence.  He understands and accepts the risk and agrees to proceed.  He lives alone.  Likely will need short-term SNF at discharge.  Melrose Nakayama 07/31/2022, 6:42 PM

## 2022-07-31 NOTE — Consult Note (Signed)
Reason for Consult: Left main/two-vessel disease, acute non-STEMI  Referring Physician: Dr. Ilona Sorrel Corey [name] is an 81 y.o. male.  HPI: 81 year old man presented with chest pain rating to the arms.  Corey [name] is an 81 year old man with a history of hypertension, hyperlipidemia, CLL, gout, diverticulitis, and BPH.  He has no prior cardiac history.  He does have a family history of cardiac disease with his father dying while awaiting bypass surgery at age 34.  This morning he was walking his dog.  He developed a severe central chest pain with radiation to both arms associated with shortness of breath.  He called EMS.  EKG showed diffuse ST depression and ST elevation in aVR.  Code STEMI was called.  His pain was better by the time he arrived he was taken emergently to the catheterization laboratory by Dr. Burt Knack.  He was found to have 75- 70% left main stenosis, 70% LAD stenosis, and 95% ostial stenosis of the circumflex not amenable to percutaneous intervention.  No disease in a large dominant right coronary.  Echocardiogram showed an EF of 40 to 45% with aortic sclerosis but no stenosis.  No chest pain since catheterization.  Relatively active but does not exercise on a regular basis.  Had a couple of brief episodes of chest discomfort in the past several days prior to this morning's events.  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  Followed by Dr. Irene Limbo.  Not going under treatment.   Past Medical History:  Diagnosis Date   CLL (chronic lymphocytic leukemia) ([place])    Diverticulitis    Gout    Hypercholesteremia    Hypertension    Prostate disorder     Past Surgical History:  Procedure Laterality Date   APPENDECTOMY      Family History  Problem Relation Age of Onset   Hypertension Father    Heart attack Father     Social History:  reports that he has never smoked. He has never used smokeless tobacco. He reports current alcohol use. He reports that he does not use  drugs.  Allergies: No Known Allergies  Medications: Scheduled:  aspirin  81 mg Oral Daily   atorvastatin  80 mg Oral Daily   [START ON 08/01/2022] finasteride  5 mg Oral Daily   metoprolol tartrate  12.5 mg Oral BID   [START ON 08/01/2022] pantoprazole  40 mg Oral Daily   sodium chloride flush  3 mL Intravenous Q12H   tamsulosin  0.4 mg Oral QPC supper    Results for orders placed or performed during the hospital encounter of 07/31/22 (from the past 48 hour(s))  Hemoglobin A1c     Status: Abnormal   Collection Time: 07/31/22  7:59 AM  Result Value Ref Range   Hgb A1c MFr Bld 5.7 (H) 4.8 - 5.6 %    Comment: (NOTE) Pre diabetes:          5.7%-6.4%  Diabetes:              >6.4%  Glycemic control for   <7.0% adults with diabetes    Mean Plasma Glucose 116.89 mg/dL    Comment: Performed at [facility], [place] [address], [place], [place] [address]  CBC with Differential/Platelet     Status: Abnormal   Collection Time: 07/31/22  7:59 AM  Result Value Ref Range   WBC 38.0 (H) 4.0 - 10.5 K/uL   RBC 4.82 4.22 - 5.81 MIL/uL   Hemoglobin 15.5 13.0 -  17.0 g/dL   HCT 45.2 39.0 - 52.0 %   MCV 93.8 80.0 - 100.0 fL   MCH 32.2 26.0 - 34.0 pg   MCHC 34.3 30.0 - 36.0 g/dL   RDW 13.1 11.5 - 15.5 %   Platelets 123 (L) 150 - 400 K/uL   nRBC 0.0 0.0 - 0.2 %   Neutrophils Relative % 10 %   Neutro Abs 3.7 1.7 - 7.7 K/uL   Lymphocytes Relative 89 %   Lymphs Abs 33.6 (H) 0.7 - 4.0 K/uL   Monocytes Relative 1 %   Monocytes Absolute 0.4 0.1 - 1.0 K/uL   Eosinophils Relative 0 %   Eosinophils Absolute 0.1 0.0 - 0.5 K/uL   Basophils Relative 0 %   Basophils Absolute 0.1 0.0 - 0.1 K/uL   WBC Morphology >10% Reactive Benign Lymphoctyes    RBC Morphology MORPHOLOGY UNREMARKABLE    Immature Granulocytes 0 %   Abs Immature Granulocytes 0.05 0.00 - 0.07 K/uL    Comment: Performed at [facility] [address], [place], [place] [address]  Protime-INR     Status: None   Collection Time:  07/31/22  7:59 AM  Result Value Ref Range   Prothrombin Time 14.4 11.4 - 15.2 seconds   INR 1.1 0.8 - 1.2    Comment: (NOTE) INR goal varies based on device and disease states. Performed at [facility], [place] [address] [place], [place] [address]   APTT     Status: None   Collection Time: 07/31/22  7:59 AM  Result Value Ref Range   aPTT 25 24 - 36 seconds    Comment: Performed at [facility] [address] [place], [place] [address]  Comprehensive metabolic panel     Status: Abnormal   Collection Time: 07/31/22  7:59 AM  Result Value Ref Range   Sodium 140 135 - 145 mmol/L   Potassium 4.9 3.5 - 5.1 mmol/L   Chloride 106 98 - 111 mmol/L   CO2 24 22 - 32 mmol/L   Glucose, Bld 125 (H) 70 - 99 mg/dL    Comment: Glucose reference range applies only to samples taken after fasting for at least 8 hours.   BUN 9 8 - 23 mg/dL   Creatinine, Ser 1.04 0.61 - 1.24 mg/dL   Calcium 8.8 (L) 8.9 - 10.3 mg/dL   Total Protein 5.8 (L) 6.5 - 8.1 g/dL   Albumin 3.4 (L) 3.5 - 5.0 g/dL   AST 29 15 - 41 U/L   ALT 22 0 - 44 U/L   Alkaline Phosphatase 47 38 - 126 U/L   Total Bilirubin 1.2 0.3 - 1.2 mg/dL   GFR, Estimated >60 >60 mL/min    Comment: (NOTE) Calculated using the CKD-EPI Creatinine Equation (2021)    Anion gap 10 5 - 15    Comment: Performed at [facility], [place] [address], [place], [place] [address]  Troponin I (High Sensitivity)     Status: Abnormal   Collection Time: 07/31/22  7:59 AM  Result Value Ref Range   Troponin I (High Sensitivity) 80 (H) <18 ng/L    Comment: (NOTE) Elevated high sensitivity troponin I (hsTnI) values and significant  changes across serial measurements may suggest ACS but many other  chronic and acute conditions are known to elevate hsTnI results.  Refer to the "Links" section for chest pain algorithms and additional  guidance. Performed at [facility], [place],  [place] [address]   Lipid panel     Status: None    Collection Time: 07/31/22  7:59 AM  Result Value Ref Range   Cholesterol 166 0 - 200 mg/dL   Triglycerides 109 <150 mg/dL   HDL 45 >40 mg/dL   Total CHOL/HDL Ratio 3.7 RATIO   VLDL 22 0 - 40 mg/dL   LDL Cholesterol 99 0 - 99 mg/dL    Comment:        Total Cholesterol/HDL:CHD Risk Coronary Heart Disease Risk Table                     Men   Women  1/2 Average Risk   3.4   3.3  Average Risk       5.0   4.4  2 X Average Risk   9.6   7.1  3 X Average Risk  23.4   11.0        Use the calculated Patient Ratio above and the CHD Risk Table to determine the patient's CHD Risk.        ATP III CLASSIFICATION (LDL):  <100     mg/dL   Optimal  100-129  mg/dL   Near or Above                    Optimal  130-159  mg/dL   Borderline  160-189  mg/dL   High  >190     mg/dL   Very High Performed at [facility] [address], [place], [place] [address]   POCT Activated clotting time     Status: None   Collection Time: 07/31/22  8:33 AM  Result Value Ref Range   Activated Clotting Time 287 seconds    Comment: Reference range 74-137 seconds for patients not on anticoagulant therapy.    VAS US DOPPLER PRE CABG  Result Date: 07/31/2022 PREOPERATIVE VASCULAR EVALUATION Patient Name:  Corey [name] [facility]  Date of Exam:   07/31/2022 Medical Rec #: [id number]        Accession #:    [id number] Date of Birth: [birth year]        Patient Gender: M Patient Age:   25 years Exam Location:  [facility] Procedure:      VAS US DOPPLER PRE CABG Referring Phys: Remo Lipps Jayden Kratochvil --------------------------------------------------------------------------------  Indications:      Pre-CABG. Risk Factors:     Hypertension, hyperlipidemia, Diabetes. Comparison Study: no prior Performing Technologist: Archie Patten RVS  Examination Guidelines: A complete evaluation includes B-mode imaging, spectral Doppler, color Doppler, and power Doppler as needed of all accessible portions of each vessel. Bilateral testing  is considered an integral part of a complete examination. Limited examinations for reoccurring indications may be performed as noted.  Right Carotid Findings: +----------+--------+--------+--------+------------+--------+           PSV cm/sEDV cm/sStenosisDescribe    Comments +----------+--------+--------+--------+------------+--------+ CCA Prox  76      13              heterogenous         +----------+--------+--------+--------+------------+--------+ CCA Distal60      15              heterogenous         +----------+--------+--------+--------+------------+--------+ ICA Prox  59      17              heterogenous         +----------+--------+--------+--------+------------+--------+ ICA Distal49  9                                    +----------+--------+--------+--------+------------+--------+ ECA       72                                           +----------+--------+--------+--------+------------+--------+ +----------+--------+-------+--------+------------+           PSV cm/sEDV cmsDescribeArm Pressure +----------+--------+-------+--------+------------+ Subclavian88                                  +----------+--------+-------+--------+------------+ +---------+--------+--+--------+-+---------+ VertebralPSV cm/s25EDV cm/s8Antegrade +---------+--------+--+--------+-+---------+ Left Carotid Findings: +----------+--------+--------+--------+------------+--------+           PSV cm/sEDV cm/sStenosisDescribe    Comments +----------+--------+--------+--------+------------+--------+ CCA Prox  85      13              heterogenous         +----------+--------+--------+--------+------------+--------+ CCA Distal50      9               heterogenous         +----------+--------+--------+--------+------------+--------+ ICA Prox  51      18              heterogenous         +----------+--------+--------+--------+------------+--------+ ICA  Distal56      23                                   +----------+--------+--------+--------+------------+--------+ ECA       72                                           +----------+--------+--------+--------+------------+--------+ +----------+--------+--------+--------+------------+ SubclavianPSV cm/sEDV cm/sDescribeArm Pressure +----------+--------+--------+--------+------------+           85                                   +----------+--------+--------+--------+------------+ +---------+--------+--+--------+--+---------+ VertebralPSV cm/s27EDV cm/s11Antegrade +---------+--------+--+--------+--+---------+  ABI Findings: +--------+------------------+-----+---------+--------+ Right   Rt Pressure (mmHg)IndexWaveform Comment  +--------+------------------+-----+---------+--------+ Brachial                       triphasic         +--------+------------------+-----+---------+--------+ ATA                            triphasic         +--------+------------------+-----+---------+--------+ PTA                            triphasic         +--------+------------------+-----+---------+--------+ +--------+------------------+-----+---------+-------+ Left    Lt Pressure (mmHg)IndexWaveform Comment +--------+------------------+-----+---------+-------+ Brachial                       triphasic        +--------+------------------+-----+---------+-------+ ATA  triphasic        +--------+------------------+-----+---------+-------+ PTA                            triphasic        +--------+------------------+-----+---------+-------+  Right Doppler Findings: +--------+--------+-----+---------+--------+ Site    PressureIndexDoppler  Comments +--------+--------+-----+---------+--------+ Brachial             triphasic         +--------+--------+-----+---------+--------+ Radial               triphasic          +--------+--------+-----+---------+--------+ Ulnar                triphasic         +--------+--------+-----+---------+--------+  Left Doppler Findings: +--------+--------+-----+---------+--------+ Site    PressureIndexDoppler  Comments +--------+--------+-----+---------+--------+ Brachial             triphasic         +--------+--------+-----+---------+--------+ Radial               triphasic         +--------+--------+-----+---------+--------+ Ulnar                triphasic         +--------+--------+-----+---------+--------+   Summary: Right Carotid: The extracranial vessels were near-normal with only minimal wall                thickening or plaque. Left Carotid: The extracranial vessels were near-normal with only minimal wall               thickening or plaque. Vertebrals: Bilateral vertebral arteries demonstrate antegrade flow. Right Upper Extremity: Doppler waveforms remain within normal limits with right radial compression. Doppler waveforms decrease 50% with right ulnar compression. Left Upper Extremity: Doppler waveforms decrease 50% with left radial compression. Doppler waveforms remain within normal limits with left ulnar compression.  Electronically signed by Deitra Mayo MD on 07/31/2022 at 6:04:51 PM.    Final    DG Chest Port 1 View  Result Date: 07/31/2022 CLINICAL DATA:  Chest pain EXAM: PORTABLE CHEST 1 VIEW COMPARISON:  09/06/2019 FINDINGS: The heart size and mediastinal contours are within normal limits. Both lungs are clear. The visualized skeletal structures are unremarkable. IMPRESSION: No active disease. Electronically Signed   By: Elmer Picker M.D.   On: 07/31/2022 16:13   ECHOCARDIOGRAM COMPLETE  Result Date: 07/31/2022    ECHOCARDIOGRAM REPORT   Patient Name:   Corey [name] Date of Exam: 07/31/2022 Medical Rec #:  [id number]       Height:       69.0 in Accession #:    [id number]      Weight:       160.0 lb Date of Birth:  07-30-1941        BSA:          1.879 m Patient Age:    51 years        BP:           147/71 mmHg Patient Gender: M               HR:           54 bpm. 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  STEMI; Risk Factors:Hypertension and Dyslipidemia.  Sonographer:    Raquel Sarna Senior RDCS Referring Phys: Mar-Mac  1. Left ventricular ejection fraction, by estimation, is 40 to 45%. The left ventricle has mildly decreased function. The left ventricle demonstrates regional wall motion abnormalities (see scoring diagram/findings for description). Left ventricular diastolic parameters are consistent with Grade I diastolic dysfunction (impaired relaxation). There is moderate hypokinesis of the left ventricular, entire anterolateral wall and inferolateral wall.  2. Right ventricular systolic function is normal. The right ventricular size is normal. Tricuspid regurgitation signal is inadequate for assessing PA pressure.  3. Left atrial size was mildly dilated.  4. A small pericardial effusion is present. The pericardial effusion is anterior to the right ventricle.  5. The mitral valve is normal in structure. No evidence of mitral valve regurgitation.  6. The aortic valve is tricuspid. There is mild calcification of the aortic valve. There is mild thickening of the aortic valve. Aortic valve regurgitation is not visualized. Aortic valve sclerosis/calcification is present, without any evidence of aortic stenosis.  7. The inferior vena cava is normal in size with greater than 50% respiratory variability, suggesting right atrial pressure of 3 mmHg. FINDINGS  Left Ventricle: Left ventricular ejection fraction, by estimation, is 40 to 45%. The left ventricle has mildly decreased function. The left ventricle demonstrates regional wall motion  abnormalities. Moderate hypokinesis of the left ventricular, entire anterolateral wall and inferolateral wall. The left ventricular internal cavity size was normal in size. There is no left ventricular hypertrophy. Left ventricular diastolic parameters are consistent with Grade I diastolic dysfunction (impaired relaxation). Normal left ventricular filling pressure. Right Ventricle: The right ventricular size is normal. No increase in right ventricular wall thickness. Right ventricular systolic function is normal. Tricuspid regurgitation signal is inadequate for assessing PA pressure. Left Atrium: Left atrial size was mildly dilated. Right Atrium: Right atrial size was normal in size. Pericardium: A small pericardial effusion is present. The pericardial effusion is anterior to the right ventricle. Mitral Valve: The mitral valve is normal in structure. No evidence of mitral valve regurgitation. Tricuspid Valve: The tricuspid valve is normal in structure. Tricuspid valve regurgitation is not demonstrated. Aortic Valve: The aortic valve is tricuspid. There is mild calcification of the aortic valve. There is mild thickening of the aortic valve. Aortic valve regurgitation is not visualized. Aortic valve sclerosis/calcification is present, without any evidence of aortic stenosis. Pulmonic Valve: The pulmonic valve was not well visualized. Pulmonic valve regurgitation is not visualized. Aorta: The aortic root and ascending aorta are structurally normal, with no evidence of dilitation. Venous: The inferior vena cava is normal in size with greater than 50% respiratory variability, suggesting right atrial pressure of 3 mmHg. IAS/Shunts: No atrial level shunt detected by color flow Doppler.  LEFT VENTRICLE PLAX 2D LVIDd:         4.20 cm   Diastology LVIDs:         2.80 cm   LV e' medial:    6.85 cm/s LV PW:         0.80 cm   LV E/e' medial:  10.4 LV IVS:        0.70 cm   LV e' lateral:   6.58 cm/s LVOT diam:     2.10 cm   LV  E/e' lateral: 10.8 LV SV:         69 LV SV Index:   36 LVOT Area:     3.46 cm  RIGHT VENTRICLE RV S  prime:     7.94 cm/s TAPSE (M-mode): 1.7 cm LEFT ATRIUM             Index        RIGHT ATRIUM           Index LA diam:        4.00 cm 2.13 cm/m   RA Area:     13.60 cm LA Vol (A2C):   37.6 ml 20.01 ml/m  RA Volume:   30.20 ml  16.07 ml/m LA Vol (A4C):   39.5 ml 21.02 ml/m LA Biplane Vol: 31.6 ml 16.82 ml/m  AORTIC VALVE LVOT Vmax:   94.00 cm/s LVOT Vmean:  60.800 cm/s LVOT VTI:    0.198 m  AORTA Ao Root diam: 3.70 cm Ao Asc diam:  3.50 cm MITRAL VALVE MV Area (PHT): 3.96 cm    SHUNTS MV Decel Time: 192 msec    Systemic VTI:  0.20 m MV E velocity: 70.90 cm/s  Systemic Diam: 2.10 cm MV A velocity: 95.15 cm/s MV E/A ratio:  0.75 Mihai Croitoru MD Electronically signed by Sanda Klein MD Signature Date/Time: 07/31/2022/4:01:37 PM    Final    CARDIAC CATHETERIZATION  Result Date: 07/31/2022   Ost LAD to Prox LAD lesion is 70% stenosed.   Mid LM to Ost LAD lesion is 70% stenosed.   Ost Cx to Prox Cx lesion is 95% stenosed.   The left ventricular systolic function is normal.   LV end diastolic pressure is normal.   The left ventricular ejection fraction is 55-65% by visual estimate. 1.  Thrombotic, ulcerated severe stenosis of the ostial/proximal circumflex (culprit lesion) 2.  Moderately severe calcific distal left main stenosis of 70% 3.  Moderately severe calcified proximal LAD stenosis of 70% 4.  Mild diffuse nonobstructive plaquing of a large, dominant RCA which supplies a large PDA branch and multiple posterolateral branches 5.  Normal LV systolic function with no regional wall motion abnormalities Recommendations: Difficult anatomy for PCI due to calcific distal left main and ostial LAD stenosis and severe ostial circumflex stenosis.  Cardiac surgery called and evaluated the patient's cath films with me while he was on the table.  We agree that he is best suited for coronary bypass surgery.  He will be  continued on heparin and will have formal cardiac surgical consultation.  At the completion of the procedure, the patient is chest pain-free and his ST segments have normalized.    I personally reviewed the cardiac catheterization images.  He has left main and two-vessel disease with tight ostial circumflex/intermediate lesion. Review of Systems Blood pressure (!) 152/72, pulse (!) 52, temperature 97.8 F (36.6 C), temperature source Oral, resp. rate 14, height '5\' 9"'$  (1.753 m), weight 72.6 kg, SpO2 94 %. Physical Exam Vitals reviewed.  Constitutional:      General: He is not in acute distress.    Appearance: Normal appearance.  HENT:     Head: Normocephalic and atraumatic.  Eyes:     General: No scleral icterus.    Extraocular Movements: Extraocular movements intact.  Neck:     Vascular: No carotid bruit.  Cardiovascular:     Rate and Rhythm: Normal rate and regular rhythm.     Pulses: Normal pulses.     Heart sounds: Normal heart sounds. No murmur heard.    No friction rub. No gallop.  Pulmonary:     Effort: Pulmonary effort is normal. No respiratory distress.     Breath sounds: Normal breath sounds.  No wheezing.  Abdominal:     General: There is no distension.     Palpations: Abdomen is soft.  Lymphadenopathy:     Cervical: No cervical adenopathy.  Skin:    General: Skin is warm and dry.  Neurological:     General: No focal deficit present.     Mental Status: He is alert and oriented to person, place, and time.     Cranial Nerves: No cranial nerve deficit.     Assessment/Plan: Corey [name] is an 81 year old man with a past history significant for hypertension, hyperlipidemia, CLL, gout, diverticulitis, and BPH.  He presented with unstable chest pain and ruled in for MI with a troponin of 80.  His pain improved with nitroglycerin and aspirin and heparin.  At catheterization he was found to have left main disease and a tight ostial circumflex stenosis not amenable to  percutaneous intervention.  Coronary artery bypass grafting is indicated for survival benefit and relief of symptoms.  I discussed with postoperative procedure with Corey [name].  We discussed the general nature of the procedure including the need for general anesthesia, the incisions to be used, the use of cardiopulmonary bypass, and the use of drainage tubes and temporary pacemaker wires postoperatively.  We discussed the expected hospital stay and overall recovery.  I informed him of the indications, risks, benefits, and alternatives.  He understands the risks include, but are not limited to death, MI, DVT, PE, bleeding, possible need for transfusion, infection, cardiac arrhythmias, respiratory or renal failure, as well as possibility of other unforeseeable complications.  He understands the palliative nature of the procedure and the potential for recurrence.  He understands and accepts the risk and agrees to proceed.  He lives alone.  Likely will need short-term SNF at discharge.  Melrose Nakayama 07/31/2022, 6:42 PM

## 2022-08-01 ENCOUNTER — Inpatient Hospital Stay (HOSPITAL_COMMUNITY): Payer: Medicare Other

## 2022-08-01 ENCOUNTER — Encounter (HOSPITAL_COMMUNITY): Payer: Self-pay | Admitting: Cardiovascular Disease

## 2022-08-01 ENCOUNTER — Inpatient Hospital Stay (HOSPITAL_COMMUNITY): Payer: Medicare Other | Admitting: Certified Registered"

## 2022-08-01 ENCOUNTER — Inpatient Hospital Stay (HOSPITAL_COMMUNITY)
Admission: EM | Disposition: A | Discharge status: Home / Self Care | Payer: Self-pay | Source: Home / Self Care | Attending: Thoracic Surgery (Cardiothoracic Vascular Surgery)

## 2022-08-01 DIAGNOSIS — I214 Non-ST elevation (NSTEMI) myocardial infarction: Secondary | ICD-10-CM | POA: Diagnosis not present

## 2022-08-01 DIAGNOSIS — I509 Heart failure, unspecified: Secondary | ICD-10-CM

## 2022-08-01 DIAGNOSIS — I2510 Atherosclerotic heart disease of native coronary artery without angina pectoris: Secondary | ICD-10-CM

## 2022-08-01 DIAGNOSIS — I252 Old myocardial infarction: Secondary | ICD-10-CM

## 2022-08-01 DIAGNOSIS — I110 Hypertensive heart disease with heart failure: Secondary | ICD-10-CM | POA: Diagnosis not present

## 2022-08-01 HISTORY — PX: CORONARY ARTERY BYPASS GRAFT: SHX141

## 2022-08-01 HISTORY — PX: TEE WITHOUT CARDIOVERSION: SHX5443

## 2022-08-01 LAB — POCT I-STAT 7, (LYTES, BLD GAS, ICA,H+H)
Acid-Base Excess: 1 mmol/L (ref 0.0–2.0)
Acid-Base Excess: 2 mmol/L (ref 0.0–2.0)
Acid-base deficit: 1 mmol/L (ref 0.0–2.0)
Acid-base deficit: 1 mmol/L (ref 0.0–2.0)
Acid-base deficit: 2 mmol/L (ref 0.0–2.0)
Acid-base deficit: 3 mmol/L — ABNORMAL HIGH (ref 0.0–2.0)
Acid-base deficit: 3 mmol/L — ABNORMAL HIGH (ref 0.0–2.0)
Acid-base deficit: 6 mmol/L — ABNORMAL HIGH (ref 0.0–2.0)
Bicarbonate: 24.4 mmol/L (ref 20.0–28.0)
Bicarbonate: 24.7 mmol/L (ref 20.0–28.0)
Bicarbonate: 25.3 mmol/L (ref 20.0–28.0)
Bicarbonate: 26.8 mmol/L (ref 20.0–28.0)
Bicarbonate: 21.7 mmol/L (ref 20.0–28.0)
Bicarbonate: 22.2 mmol/L (ref 20.0–28.0)
Bicarbonate: 21.7 mmol/L (ref 20.0–28.0)
Bicarbonate: 19.7 mmol/L — ABNORMAL LOW (ref 20.0–28.0)
Calcium, Ion: 1.16 mmol/L (ref 1.15–1.40)
Calcium, Ion: 0.96 mmol/L — ABNORMAL LOW (ref 1.15–1.40)
Calcium, Ion: 1.01 mmol/L — ABNORMAL LOW (ref 1.15–1.40)
Calcium, Ion: 1.03 mmol/L — ABNORMAL LOW (ref 1.15–1.40)
Calcium, Ion: 1.05 mmol/L — ABNORMAL LOW (ref 1.15–1.40)
Calcium, Ion: 1.07 mmol/L — ABNORMAL LOW (ref 1.15–1.40)
Calcium, Ion: 1.05 mmol/L — ABNORMAL LOW (ref 1.15–1.40)
Calcium, Ion: 1.06 mmol/L — ABNORMAL LOW (ref 1.15–1.40)
HCT: 39 % (ref 39.0–52.0)
HCT: 27 % — ABNORMAL LOW (ref 39.0–52.0)
HCT: 28 % — ABNORMAL LOW (ref 39.0–52.0)
HCT: 28 % — ABNORMAL LOW (ref 39.0–52.0)
HCT: 31 % — ABNORMAL LOW (ref 39.0–52.0)
HCT: 33 % — ABNORMAL LOW (ref 39.0–52.0)
HCT: 37 % — ABNORMAL LOW (ref 39.0–52.0)
HCT: 38 % — ABNORMAL LOW (ref 39.0–52.0)
Hemoglobin: 13.3 g/dL (ref 13.0–17.0)
Hemoglobin: 9.2 g/dL — ABNORMAL LOW (ref 13.0–17.0)
Hemoglobin: 9.5 g/dL — ABNORMAL LOW (ref 13.0–17.0)
Hemoglobin: 9.5 g/dL — ABNORMAL LOW (ref 13.0–17.0)
Hemoglobin: 10.5 g/dL — ABNORMAL LOW (ref 13.0–17.0)
Hemoglobin: 11.2 g/dL — ABNORMAL LOW (ref 13.0–17.0)
Hemoglobin: 12.6 g/dL — ABNORMAL LOW (ref 13.0–17.0)
Hemoglobin: 12.9 g/dL — ABNORMAL LOW (ref 13.0–17.0)
O2 Saturation: 100 %
O2 Saturation: 100 %
O2 Saturation: 100 %
O2 Saturation: 87 %
O2 Saturation: 99 %
O2 Saturation: 94 %
O2 Saturation: 95 %
O2 Saturation: 92 %
Patient temperature: 36.4
Patient temperature: 37.2
Patient temperature: 37.8
Potassium: 3.2 mmol/L — ABNORMAL LOW (ref 3.5–5.1)
Potassium: 3.8 mmol/L (ref 3.5–5.1)
Potassium: 4.3 mmol/L (ref 3.5–5.1)
Potassium: 4.5 mmol/L (ref 3.5–5.1)
Potassium: 4.1 mmol/L (ref 3.5–5.1)
Potassium: 3.7 mmol/L (ref 3.5–5.1)
Potassium: 4.4 mmol/L (ref 3.5–5.1)
Potassium: 4.2 mmol/L (ref 3.5–5.1)
Sodium: 139 mmol/L (ref 135–145)
Sodium: 136 mmol/L (ref 135–145)
Sodium: 137 mmol/L (ref 135–145)
Sodium: 138 mmol/L (ref 135–145)
Sodium: 137 mmol/L (ref 135–145)
Sodium: 138 mmol/L (ref 135–145)
Sodium: 136 mmol/L (ref 135–145)
Sodium: 136 mmol/L (ref 135–145)
TCO2: 26 mmol/L (ref 22–32)
TCO2: 26 mmol/L (ref 22–32)
TCO2: 26 mmol/L (ref 22–32)
TCO2: 28 mmol/L (ref 22–32)
TCO2: 23 mmol/L (ref 22–32)
TCO2: 23 mmol/L (ref 22–32)
TCO2: 23 mmol/L (ref 22–32)
TCO2: 21 mmol/L — ABNORMAL LOW (ref 22–32)
pCO2 arterial: 44 mmHg (ref 32–48)
pCO2 arterial: 37.5 mmHg (ref 32–48)
pCO2 arterial: 43.4 mmHg (ref 32–48)
pCO2 arterial: 43.5 mmHg (ref 32–48)
pCO2 arterial: 34.1 mmHg (ref 32–48)
pCO2 arterial: 37.5 mmHg (ref 32–48)
pCO2 arterial: 37.1 mmHg (ref 32–48)
pCO2 arterial: 38.6 mmHg (ref 32–48)
pH, Arterial: 7.353 (ref 7.35–7.45)
pH, Arterial: 7.363 (ref 7.35–7.45)
pH, Arterial: 7.398 (ref 7.35–7.45)
pH, Arterial: 7.437 (ref 7.35–7.45)
pH, Arterial: 7.413 (ref 7.35–7.45)
pH, Arterial: 7.378 (ref 7.35–7.45)
pH, Arterial: 7.376 (ref 7.35–7.45)
pH, Arterial: 7.32 — ABNORMAL LOW (ref 7.35–7.45)
pO2, Arterial: 494 mmHg — ABNORMAL HIGH (ref 83–108)
pO2, Arterial: 352 mmHg — ABNORMAL HIGH (ref 83–108)
pO2, Arterial: 480 mmHg — ABNORMAL HIGH (ref 83–108)
pO2, Arterial: 56 mmHg — ABNORMAL LOW (ref 83–108)
pO2, Arterial: 137 mmHg — ABNORMAL HIGH (ref 83–108)
pO2, Arterial: 70 mmHg — ABNORMAL LOW (ref 83–108)
pO2, Arterial: 79 mmHg — ABNORMAL LOW (ref 83–108)
pO2, Arterial: 73 mmHg — ABNORMAL LOW (ref 83–108)

## 2022-08-01 LAB — BASIC METABOLIC PANEL
Anion gap: 5 (ref 5–15)
Anion gap: 7 (ref 5–15)
BUN: 9 mg/dL (ref 8–23)
BUN: 8 mg/dL (ref 8–23)
CO2: 26 mmol/L (ref 22–32)
CO2: 20 mmol/L — ABNORMAL LOW (ref 22–32)
Calcium: 8.5 mg/dL — ABNORMAL LOW (ref 8.9–10.3)
Calcium: 7.2 mg/dL — ABNORMAL LOW (ref 8.9–10.3)
Chloride: 107 mmol/L (ref 98–111)
Chloride: 110 mmol/L (ref 98–111)
Creatinine, Ser: 1.12 mg/dL (ref 0.61–1.24)
Creatinine, Ser: 0.93 mg/dL (ref 0.61–1.24)
GFR, Estimated: >60 mL/min (ref >60)
GFR, Estimated: >60 mL/min (ref >60)
Glucose, Bld: 99 mg/dL (ref 70–99)
Glucose, Bld: 140 mg/dL — ABNORMAL HIGH (ref 70–99)
Potassium: 4.7 mmol/L (ref 3.5–5.1)
Potassium: 4.4 mmol/L (ref 3.5–5.1)
Sodium: 138 mmol/L (ref 135–145)
Sodium: 137 mmol/L (ref 135–145)

## 2022-08-01 LAB — POCT I-STAT, CHEM 8
BUN: 8 mg/dL (ref 8–23)
BUN: 7 mg/dL — ABNORMAL LOW (ref 8–23)
BUN: 8 mg/dL (ref 8–23)
BUN: 7 mg/dL — ABNORMAL LOW (ref 8–23)
Calcium, Ion: 1.18 mmol/L (ref 1.15–1.40)
Calcium, Ion: 1 mmol/L — ABNORMAL LOW (ref 1.15–1.40)
Calcium, Ion: 1.19 mmol/L (ref 1.15–1.40)
Calcium, Ion: 1.03 mmol/L — ABNORMAL LOW (ref 1.15–1.40)
Chloride: 102 mmol/L (ref 98–111)
Chloride: 101 mmol/L (ref 98–111)
Chloride: 101 mmol/L (ref 98–111)
Chloride: 101 mmol/L (ref 98–111)
Creatinine, Ser: 0.7 mg/dL (ref 0.61–1.24)
Creatinine, Ser: 0.6 mg/dL — ABNORMAL LOW (ref 0.61–1.24)
Creatinine, Ser: 0.7 mg/dL (ref 0.61–1.24)
Creatinine, Ser: 0.7 mg/dL (ref 0.61–1.24)
Glucose, Bld: 97 mg/dL (ref 70–99)
Glucose, Bld: 110 mg/dL — ABNORMAL HIGH (ref 70–99)
Glucose, Bld: 123 mg/dL — ABNORMAL HIGH (ref 70–99)
Glucose, Bld: 143 mg/dL — ABNORMAL HIGH (ref 70–99)
HCT: 40 % (ref 39.0–52.0)
HCT: 27 % — ABNORMAL LOW (ref 39.0–52.0)
HCT: 37 % — ABNORMAL LOW (ref 39.0–52.0)
HCT: 30 % — ABNORMAL LOW (ref 39.0–52.0)
Hemoglobin: 13.6 g/dL (ref 13.0–17.0)
Hemoglobin: 12.6 g/dL — ABNORMAL LOW (ref 13.0–17.0)
Hemoglobin: 9.2 g/dL — ABNORMAL LOW (ref 13.0–17.0)
Hemoglobin: 10.2 g/dL — ABNORMAL LOW (ref 13.0–17.0)
Potassium: 3.3 mmol/L — ABNORMAL LOW (ref 3.5–5.1)
Potassium: 3.6 mmol/L (ref 3.5–5.1)
Potassium: 4.4 mmol/L (ref 3.5–5.1)
Potassium: 4.1 mmol/L (ref 3.5–5.1)
Sodium: 141 mmol/L (ref 135–145)
Sodium: 138 mmol/L (ref 135–145)
Sodium: 138 mmol/L (ref 135–145)
Sodium: 137 mmol/L (ref 135–145)
TCO2: 27 mmol/L (ref 22–32)
TCO2: 24 mmol/L (ref 22–32)
TCO2: 26 mmol/L (ref 22–32)
TCO2: 25 mmol/L (ref 22–32)

## 2022-08-01 LAB — CBC
HCT: 43.9 % (ref 39.0–52.0)
HCT: 34.4 % — ABNORMAL LOW (ref 39.0–52.0)
HCT: 36.9 % — ABNORMAL LOW (ref 39.0–52.0)
Hemoglobin: 14.5 g/dL (ref 13.0–17.0)
Hemoglobin: 12 g/dL — ABNORMAL LOW (ref 13.0–17.0)
Hemoglobin: 12.8 g/dL — ABNORMAL LOW (ref 13.0–17.0)
MCH: 31 pg (ref 26.0–34.0)
MCH: 32.7 pg (ref 26.0–34.0)
MCH: 32.6 pg (ref 26.0–34.0)
MCHC: 33 g/dL (ref 30.0–36.0)
MCHC: 34.9 g/dL (ref 30.0–36.0)
MCHC: 34.7 g/dL (ref 30.0–36.0)
MCV: 94 fL (ref 80.0–100.0)
MCV: 93.7 fL (ref 80.0–100.0)
MCV: 93.9 fL (ref 80.0–100.0)
Platelets: 124 10*3/uL — ABNORMAL LOW (ref 150–400)
Platelets: 93 10*3/uL — ABNORMAL LOW (ref 150–400)
Platelets: 104 10*3/uL — ABNORMAL LOW (ref 150–400)
RBC: 4.67 MIL/uL (ref 4.22–5.81)
RBC: 3.67 MIL/uL — ABNORMAL LOW (ref 4.22–5.81)
RBC: 3.93 MIL/uL — ABNORMAL LOW (ref 4.22–5.81)
RDW: 13.3 % (ref 11.5–15.5)
RDW: 13.4 % (ref 11.5–15.5)
RDW: 13.4 % (ref 11.5–15.5)
WBC: 36.3 10*3/uL — ABNORMAL HIGH (ref 4.0–10.5)
WBC: 29.1 10*3/uL — ABNORMAL HIGH (ref 4.0–10.5)
WBC: 29.9 10*3/uL — ABNORMAL HIGH (ref 4.0–10.5)
nRBC: 0 % (ref 0.0–0.2)
nRBC: 0 % (ref 0.0–0.2)
nRBC: 0.6 % — ABNORMAL HIGH (ref 0.0–0.2)

## 2022-08-01 LAB — HEMOGLOBIN AND HEMATOCRIT, BLOOD
HCT: 29.1 % — ABNORMAL LOW (ref 39.0–52.0)
Hemoglobin: 9.7 g/dL — ABNORMAL LOW (ref 13.0–17.0)

## 2022-08-01 LAB — GLUCOSE, CAPILLARY
Glucose-Capillary: 131 mg/dL — ABNORMAL HIGH (ref 70–99)
Glucose-Capillary: 120 mg/dL — ABNORMAL HIGH (ref 70–99)
Glucose-Capillary: 131 mg/dL — ABNORMAL HIGH (ref 70–99)
Glucose-Capillary: 96 mg/dL (ref 70–99)
Glucose-Capillary: 121 mg/dL — ABNORMAL HIGH (ref 70–99)
Glucose-Capillary: 146 mg/dL — ABNORMAL HIGH (ref 70–99)
Glucose-Capillary: 133 mg/dL — ABNORMAL HIGH (ref 70–99)
Glucose-Capillary: 129 mg/dL — ABNORMAL HIGH (ref 70–99)
Glucose-Capillary: 124 mg/dL — ABNORMAL HIGH (ref 70–99)
Glucose-Capillary: 152 mg/dL — ABNORMAL HIGH (ref 70–99)

## 2022-08-01 LAB — ECHO INTRAOPERATIVE TEE
Height: 69 in
Weight: 2560 oz

## 2022-08-01 LAB — APTT: aPTT: 29 seconds (ref 24–36)

## 2022-08-01 LAB — PROTIME-INR
INR: 1.5 — ABNORMAL HIGH (ref 0.8–1.2)
Prothrombin Time: 17.5 seconds — ABNORMAL HIGH (ref 11.4–15.2)

## 2022-08-01 LAB — PLATELET COUNT: Platelets: 105 10*3/uL — ABNORMAL LOW (ref 150–400)

## 2022-08-01 SURGERY — CORONARY ARTERY BYPASS GRAFTING (CABG)
Anesthesia: General | Site: Chest

## 2022-08-01 MED ORDER — ACETAMINOPHEN 500 MG PO TABS
1000.0000 mg | ORAL_TABLET | Freq: Four times a day (QID) | ORAL | Status: DC
  Administered 2022-08-02 – 2022-08-06 (×13): 1000 mg via ORAL
  Filled 2022-08-01 (×13): qty 2

## 2022-08-01 MED ORDER — PHENYLEPHRINE HCL-NACL 20-0.9 MG/250ML-% IV SOLN
30.0000 ug/min | INTRAVENOUS | Status: DC
  Filled 2022-08-01: qty 250

## 2022-08-01 MED ORDER — PHENYLEPHRINE HCL-NACL 20-0.9 MG/250ML-% IV SOLN
0.0000 ug/min | INTRAVENOUS | Status: DC
  Administered 2022-08-01: 20 ug/min via INTRAVENOUS
  Administered 2022-08-02: 40 ug/min via INTRAVENOUS
  Filled 2022-08-01: qty 250

## 2022-08-01 MED ORDER — SODIUM CHLORIDE 0.9% FLUSH: 3.0000 mL | INTRAVENOUS | Status: DC | PRN

## 2022-08-01 MED ORDER — DOBUTAMINE INFUSION FOR EP/ECHO/NUC (1000 MCG/ML)
INTRAVENOUS | Status: DC | PRN
  Administered 2022-08-01: 3 ug/kg/min via INTRAVENOUS

## 2022-08-01 MED ORDER — VANCOMYCIN HCL IN DEXTROSE 1-5 GM/200ML-% IV SOLN
1000.0000 mg | Freq: Once | INTRAVENOUS | Status: AC
  Administered 2022-08-01: 1000 mg via INTRAVENOUS
  Filled 2022-08-01: qty 200

## 2022-08-01 MED ORDER — SODIUM CHLORIDE 0.9 % IV SOLN: INTRAVENOUS | Status: DC

## 2022-08-01 MED ORDER — LACTATED RINGERS IV SOLN: INTRAVENOUS | Status: DC

## 2022-08-01 MED ORDER — POTASSIUM CHLORIDE 10 MEQ/50ML IV SOLN
10.0000 meq | INTRAVENOUS | Status: AC
  Administered 2022-08-01 (×3): 10 meq via INTRAVENOUS

## 2022-08-01 MED ORDER — NITROGLYCERIN IN D5W 200-5 MCG/ML-% IV SOLN: 0.0000 ug/min | INTRAVENOUS | Status: DC

## 2022-08-01 MED ORDER — ACETAMINOPHEN 160 MG/5ML PO SOLN
650.0000 mg | Freq: Once | ORAL | Status: AC
  Administered 2022-08-01: 650 mg

## 2022-08-01 MED ORDER — ORAL CARE MOUTH RINSE
15.0000 mL | OROMUCOSAL | Status: DC
  Administered 2022-08-01 (×2): 15 mL via OROMUCOSAL

## 2022-08-01 MED ORDER — MILRINONE LACTATE IN DEXTROSE 20-5 MG/100ML-% IV SOLN
0.3000 ug/kg/min | INTRAVENOUS | Status: DC
  Filled 2022-08-01: qty 100

## 2022-08-01 MED ORDER — TRANEXAMIC ACID (OHS) BOLUS VIA INFUSION
15.0000 mg/kg | INTRAVENOUS | Status: AC
  Administered 2022-08-01: 1089 mg via INTRAVENOUS
  Filled 2022-08-01: qty 1089

## 2022-08-01 MED ORDER — TRANEXAMIC ACID (OHS) PUMP PRIME SOLUTION
2.0000 mg/kg | INTRAVENOUS | Status: DC
  Filled 2022-08-01: qty 1.45

## 2022-08-01 MED ORDER — DOCUSATE SODIUM 100 MG PO CAPS
200.0000 mg | ORAL_CAPSULE | Freq: Every day | ORAL | Status: DC
  Administered 2022-08-02 – 2022-08-04 (×3): 200 mg via ORAL
  Filled 2022-08-01 (×3): qty 2

## 2022-08-01 MED ORDER — SODIUM CHLORIDE (PF) 0.9 % IJ SOLN: OROMUCOSAL | Status: DC | PRN

## 2022-08-01 MED ORDER — FENTANYL CITRATE (PF) 250 MCG/5ML IJ SOLN
INTRAMUSCULAR | Status: AC
  Filled 2022-08-01: qty 5

## 2022-08-01 MED ORDER — PROTAMINE SULFATE 10 MG/ML IV SOLN
INTRAVENOUS | Status: DC | PRN
  Administered 2022-08-01: 270 mg via INTRAVENOUS

## 2022-08-01 MED ORDER — INSULIN REGULAR(HUMAN) IN NACL 100-0.9 UT/100ML-% IV SOLN
INTRAVENOUS | Status: DC
  Administered 2022-08-01: 1.4 [IU]/h via INTRAVENOUS

## 2022-08-01 MED ORDER — LACTATED RINGERS IV SOLN: INTRAVENOUS | Status: DC | PRN

## 2022-08-01 MED ORDER — BISACODYL 5 MG PO TBEC
10.0000 mg | DELAYED_RELEASE_TABLET | Freq: Every day | ORAL | Status: DC
  Administered 2022-08-02 – 2022-08-04 (×3): 10 mg via ORAL
  Filled 2022-08-01 (×3): qty 2

## 2022-08-01 MED ORDER — ACETAMINOPHEN 650 MG RE SUPP: 650.0000 mg | Freq: Once | RECTAL | Status: AC

## 2022-08-01 MED ORDER — ORAL CARE MOUTH RINSE
15.0000 mL | Freq: Four times a day (QID) | OROMUCOSAL | Status: DC
  Administered 2022-08-01 – 2022-08-02 (×5): 15 mL via OROMUCOSAL

## 2022-08-01 MED ORDER — LIDOCAINE 2% (20 MG/ML) 5 ML SYRINGE
INTRAMUSCULAR | Status: DC | PRN
  Administered 2022-08-01: 100 mg via INTRAVENOUS

## 2022-08-01 MED ORDER — ORAL CARE MOUTH RINSE: 15.0000 mL | OROMUCOSAL | Status: DC | PRN

## 2022-08-01 MED ORDER — PANTOPRAZOLE SODIUM 40 MG PO TBEC
40.0000 mg | DELAYED_RELEASE_TABLET | Freq: Every day | ORAL | Status: DC
  Administered 2022-08-03 – 2022-08-06 (×4): 40 mg via ORAL
  Filled 2022-08-01 (×4): qty 1

## 2022-08-01 MED ORDER — METOPROLOL TARTRATE 25 MG/10 ML ORAL SUSPENSION: 12.5000 mg | Freq: Two times a day (BID) | ORAL | Status: DC

## 2022-08-01 MED ORDER — MIDAZOLAM HCL 5 MG/5ML IJ SOLN
INTRAMUSCULAR | Status: DC | PRN
  Administered 2022-08-01: 2 mg via INTRAVENOUS
  Administered 2022-08-01 (×2): 1 mg via INTRAVENOUS

## 2022-08-01 MED ORDER — CHLORHEXIDINE GLUCONATE 0.12 % MT SOLN
15.0000 mL | OROMUCOSAL | Status: AC
  Administered 2022-08-01: 15 mL via OROMUCOSAL
  Filled 2022-08-01: qty 15

## 2022-08-01 MED ORDER — TRANEXAMIC ACID 1000 MG/10ML IV SOLN
1.5000 mg/kg/h | INTRAVENOUS | Status: AC
  Administered 2022-08-01: 1.5 mg/kg/h via INTRAVENOUS
  Filled 2022-08-01: qty 25

## 2022-08-01 MED ORDER — CEFAZOLIN SODIUM-DEXTROSE 2-4 GM/100ML-% IV SOLN
2.0000 g | INTRAVENOUS | Status: DC
  Filled 2022-08-01: qty 100

## 2022-08-01 MED ORDER — ALBUMIN HUMAN 5 % IV SOLN
250.0000 mL | INTRAVENOUS | Status: AC | PRN
  Administered 2022-08-01 – 2022-08-02 (×4): 12.5 g via INTRAVENOUS
  Filled 2022-08-01 (×2): qty 250

## 2022-08-01 MED ORDER — PLASMA-LYTE A IV SOLN
INTRAVENOUS | Status: DC | PRN
  Administered 2022-08-01: 500 mL via INTRAVASCULAR

## 2022-08-01 MED ORDER — EPINEPHRINE HCL 5 MG/250ML IV SOLN IN NS
0.0000 ug/min | INTRAVENOUS | Status: DC
  Filled 2022-08-01: qty 250

## 2022-08-01 MED ORDER — NOREPINEPHRINE 4 MG/250ML-% IV SOLN
0.0000 ug/min | INTRAVENOUS | Status: DC
  Filled 2022-08-01: qty 250

## 2022-08-01 MED ORDER — METOPROLOL TARTRATE 5 MG/5ML IV SOLN: 2.5000 mg | INTRAVENOUS | Status: DC | PRN

## 2022-08-01 MED ORDER — TRAMADOL HCL 50 MG PO TABS
50.0000 mg | ORAL_TABLET | ORAL | Status: DC | PRN
  Administered 2022-08-01: 50 mg via ORAL
  Administered 2022-08-02: 100 mg via ORAL
  Administered 2022-08-02: 50 mg via ORAL
  Filled 2022-08-01: qty 1
  Filled 2022-08-01: qty 2
  Filled 2022-08-01: qty 1

## 2022-08-01 MED ORDER — CEFAZOLIN SODIUM-DEXTROSE 2-3 GM-%(50ML) IV SOLR: INTRAVENOUS | Status: DC | PRN

## 2022-08-01 MED ORDER — SODIUM CHLORIDE 0.45 % IV SOLN: INTRAVENOUS | Status: DC | PRN

## 2022-08-01 MED ORDER — DEXMEDETOMIDINE HCL IN NACL 400 MCG/100ML IV SOLN
0.1000 ug/kg/h | INTRAVENOUS | Status: AC
  Administered 2022-08-01: .3 ug/kg/h via INTRAVENOUS
  Filled 2022-08-01: qty 100

## 2022-08-01 MED ORDER — MIDAZOLAM HCL (PF) 10 MG/2ML IJ SOLN
INTRAMUSCULAR | Status: AC
  Filled 2022-08-01: qty 2

## 2022-08-01 MED ORDER — PROPOFOL 10 MG/ML IV BOLUS
INTRAVENOUS | Status: AC
  Filled 2022-08-01: qty 20

## 2022-08-01 MED ORDER — MAGNESIUM SULFATE 50 % IJ SOLN
40.0000 meq | INTRAMUSCULAR | Status: DC
  Filled 2022-08-01: qty 9.85

## 2022-08-01 MED ORDER — PROPOFOL 10 MG/ML IV BOLUS
INTRAVENOUS | Status: DC | PRN
  Administered 2022-08-01: 60 mg via INTRAVENOUS

## 2022-08-01 MED ORDER — POTASSIUM CHLORIDE 2 MEQ/ML IV SOLN
80.0000 meq | INTRAVENOUS | Status: DC
  Filled 2022-08-01: qty 40

## 2022-08-01 MED ORDER — INSULIN REGULAR(HUMAN) IN NACL 100-0.9 UT/100ML-% IV SOLN
INTRAVENOUS | Status: AC
  Administered 2022-08-01: .8 [IU]/h via INTRAVENOUS
  Filled 2022-08-01: qty 100

## 2022-08-01 MED ORDER — CHLORHEXIDINE GLUCONATE CLOTH 2 % EX PADS
6.0000 | MEDICATED_PAD | Freq: Every day | CUTANEOUS | Status: DC
  Administered 2022-08-01 – 2022-08-05 (×5): 6 via TOPICAL

## 2022-08-01 MED ORDER — DEXMEDETOMIDINE HCL IN NACL 400 MCG/100ML IV SOLN: 0.0000 ug/kg/h | INTRAVENOUS | Status: DC

## 2022-08-01 MED ORDER — DEXTROSE 50 % IV SOLN: 0.0000 mL | INTRAVENOUS | Status: DC | PRN

## 2022-08-01 MED ORDER — FENTANYL CITRATE (PF) 250 MCG/5ML IJ SOLN
INTRAMUSCULAR | Status: DC | PRN
  Administered 2022-08-01: 100 ug via INTRAVENOUS
  Administered 2022-08-01: 50 ug via INTRAVENOUS
  Administered 2022-08-01 (×2): 100 ug via INTRAVENOUS
  Administered 2022-08-01: 50 ug via INTRAVENOUS
  Administered 2022-08-01: 100 ug via INTRAVENOUS
  Administered 2022-08-01: 50 ug via INTRAVENOUS
  Administered 2022-08-01: 100 ug via INTRAVENOUS

## 2022-08-01 MED ORDER — FAMOTIDINE IN NACL 20-0.9 MG/50ML-% IV SOLN: 20.0000 mg | Freq: Two times a day (BID) | INTRAVENOUS | Status: AC

## 2022-08-01 MED ORDER — TRANEXAMIC ACID-NACL 1000-0.7 MG/100ML-% IV SOLN: 1000.0000 mg | INTRAVENOUS | Status: DC

## 2022-08-01 MED ORDER — MAGNESIUM SULFATE 4 GM/100ML IV SOLN
4.0000 g | Freq: Once | INTRAVENOUS | Status: AC
  Administered 2022-08-01: 4 g via INTRAVENOUS
  Filled 2022-08-01: qty 100

## 2022-08-01 MED ORDER — HEPARIN 30,000 UNITS/1000 ML (OHS) CELLSAVER SOLUTION
Status: DC
  Filled 2022-08-01: qty 1000

## 2022-08-01 MED ORDER — 0.9 % SODIUM CHLORIDE (POUR BTL) OPTIME
TOPICAL | Status: DC | PRN
  Administered 2022-08-01: 5000 mL

## 2022-08-01 MED ORDER — MIDAZOLAM HCL 2 MG/2ML IJ SOLN: 2.0000 mg | INTRAMUSCULAR | Status: DC | PRN

## 2022-08-01 MED ORDER — ACETAMINOPHEN 160 MG/5ML PO SOLN: 1000.0000 mg | Freq: Four times a day (QID) | ORAL | Status: DC

## 2022-08-01 MED ORDER — METOPROLOL TARTRATE 12.5 MG HALF TABLET
12.5000 mg | ORAL_TABLET | Freq: Two times a day (BID) | ORAL | Status: DC
  Administered 2022-08-01 – 2022-08-05 (×6): 12.5 mg via ORAL
  Filled 2022-08-01 (×6): qty 1

## 2022-08-01 MED ORDER — ROCURONIUM BROMIDE 10 MG/ML (PF) SYRINGE
PREFILLED_SYRINGE | INTRAVENOUS | Status: DC | PRN
  Administered 2022-08-01: 80 mg via INTRAVENOUS
  Administered 2022-08-01: 50 mg via INTRAVENOUS
  Administered 2022-08-01 (×2): 20 mg via INTRAVENOUS

## 2022-08-01 MED ORDER — ONDANSETRON HCL 4 MG/2ML IJ SOLN
4.0000 mg | Freq: Four times a day (QID) | INTRAMUSCULAR | Status: DC | PRN
  Administered 2022-08-02: 4 mg via INTRAVENOUS
  Filled 2022-08-01: qty 2

## 2022-08-01 MED ORDER — OXYCODONE HCL 5 MG PO TABS
5.0000 mg | ORAL_TABLET | ORAL | Status: DC | PRN
  Administered 2022-08-01 – 2022-08-02 (×3): 10 mg via ORAL
  Administered 2022-08-02: 5 mg via ORAL
  Filled 2022-08-01 (×2): qty 2
  Filled 2022-08-01: qty 1
  Filled 2022-08-01: qty 2

## 2022-08-01 MED ORDER — LACTATED RINGERS IV SOLN: 500.0000 mL | Freq: Once | INTRAVENOUS | Status: DC | PRN

## 2022-08-01 MED ORDER — BISACODYL 10 MG RE SUPP: 10.0000 mg | Freq: Every day | RECTAL | Status: DC

## 2022-08-01 MED ORDER — SODIUM CHLORIDE 0.9 % IV SOLN: 250.0000 mL | INTRAVENOUS | Status: DC

## 2022-08-01 MED ORDER — HEMOSTATIC AGENTS (NO CHARGE) OPTIME
TOPICAL | Status: DC | PRN
  Administered 2022-08-01: 1 via TOPICAL

## 2022-08-01 MED ORDER — PHENYLEPHRINE 80 MCG/ML (10ML) SYRINGE FOR IV PUSH (FOR BLOOD PRESSURE SUPPORT)
PREFILLED_SYRINGE | INTRAVENOUS | Status: DC | PRN
  Administered 2022-08-01: 80 ug via INTRAVENOUS
  Administered 2022-08-01: 160 ug via INTRAVENOUS
  Administered 2022-08-01 (×2): 80 ug via INTRAVENOUS
  Administered 2022-08-01: 160 ug via INTRAVENOUS

## 2022-08-01 MED ORDER — CEFAZOLIN SODIUM-DEXTROSE 2-4 GM/100ML-% IV SOLN
2.0000 g | Freq: Three times a day (TID) | INTRAVENOUS | Status: AC
  Administered 2022-08-01 – 2022-08-03 (×6): 2 g via INTRAVENOUS
  Filled 2022-08-01 (×6): qty 100

## 2022-08-01 MED ORDER — CEFAZOLIN SODIUM-DEXTROSE 2-4 GM/100ML-% IV SOLN
2.0000 g | INTRAVENOUS | Status: AC
  Administered 2022-08-01 (×2): 2 g via INTRAVENOUS

## 2022-08-01 MED ORDER — PLASMA-LYTE A IV SOLN
INTRAVENOUS | Status: DC
  Filled 2022-08-01: qty 2.5

## 2022-08-01 MED ORDER — FAMOTIDINE IN NACL 20-0.9 MG/50ML-% IV SOLN
INTRAVENOUS | Status: AC
  Administered 2022-08-01: 20 mg via INTRAVENOUS
  Filled 2022-08-01: qty 50

## 2022-08-01 MED ORDER — MORPHINE SULFATE (PF) 2 MG/ML IV SOLN
1.0000 mg | INTRAVENOUS | Status: DC | PRN
  Administered 2022-08-01: 2 mg via INTRAVENOUS
  Administered 2022-08-01: 4 mg via INTRAVENOUS
  Administered 2022-08-01: 2 mg via INTRAVENOUS
  Administered 2022-08-02: 4 mg via INTRAVENOUS
  Filled 2022-08-01: qty 2
  Filled 2022-08-01: qty 1
  Filled 2022-08-01: qty 2
  Filled 2022-08-01: qty 1

## 2022-08-01 MED ORDER — HEPARIN SODIUM (PORCINE) 1000 UNIT/ML IJ SOLN
INTRAMUSCULAR | Status: DC | PRN
  Administered 2022-08-01: 2000 [IU] via INTRAVENOUS
  Administered 2022-08-01: 25000 [IU] via INTRAVENOUS

## 2022-08-01 MED ORDER — PHENYLEPHRINE HCL-NACL 20-0.9 MG/250ML-% IV SOLN
INTRAVENOUS | Status: DC | PRN
  Administered 2022-08-01: 20 ug/min via INTRAVENOUS

## 2022-08-01 MED ORDER — VANCOMYCIN HCL 1250 MG/250ML IV SOLN
1250.0000 mg | INTRAVENOUS | Status: AC
  Administered 2022-08-01: 1250 mg via INTRAVENOUS
  Filled 2022-08-01: qty 250

## 2022-08-01 MED ORDER — ASPIRIN 81 MG PO CHEW: 324.0000 mg | CHEWABLE_TABLET | Freq: Every day | ORAL | Status: DC

## 2022-08-01 MED ORDER — ASPIRIN 325 MG PO TBEC
325.0000 mg | DELAYED_RELEASE_TABLET | Freq: Every day | ORAL | Status: DC
  Administered 2022-08-02 – 2022-08-06 (×5): 325 mg via ORAL
  Filled 2022-08-01 (×5): qty 1

## 2022-08-01 MED ORDER — SODIUM CHLORIDE 0.9% FLUSH
3.0000 mL | Freq: Two times a day (BID) | INTRAVENOUS | Status: DC
  Administered 2022-08-02 – 2022-08-03 (×3): 3 mL via INTRAVENOUS

## 2022-08-01 MED FILL — Nitroglycerin IV Soln 100 MCG/ML in D5W: INTRA_ARTERIAL | Qty: 10 | Status: AC

## 2022-08-01 SURGICAL SUPPLY — 92 items
BAG DECANTER FOR FLEXI CONT (MISCELLANEOUS) ×2 IMPLANT
BLADE CLIPPER SURG (BLADE) ×2 IMPLANT
BLADE STERNUM SYSTEM 6 (BLADE) ×2 IMPLANT
BLADE SURG 11 STRL SS (BLADE) IMPLANT
BNDG ELASTIC 4X5.8 VLCR STR LF (GAUZE/BANDAGES/DRESSINGS) ×2 IMPLANT
BNDG ELASTIC 6X10 VLCR STRL LF (GAUZE/BANDAGES/DRESSINGS) IMPLANT
BNDG ELASTIC 6X5.8 VLCR STR LF (GAUZE/BANDAGES/DRESSINGS) ×2 IMPLANT
BNDG GAUZE DERMACEA FLUFF 4 (GAUZE/BANDAGES/DRESSINGS) ×2 IMPLANT
CANISTER SUCT 3000ML PPV (MISCELLANEOUS) ×2 IMPLANT
CANNULA AORTIC ROOT 9FR (CANNULA) IMPLANT
CANNULA EZ GLIDE AORTIC 21FR (CANNULA) ×2 IMPLANT
CANNULA MC2 2 STG 36/46 NON-V (CANNULA) IMPLANT
CANNULA VENOUS 2 STG 34/46 (CANNULA) ×2
CATH CPB KIT HENDRICKSON (MISCELLANEOUS) ×2 IMPLANT
CATH ROBINSON RED A/P 18FR (CATHETERS) ×2 IMPLANT
CATH THORACIC 36FR (CATHETERS) ×2 IMPLANT
CATH THORACIC 36FR RT ANG (CATHETERS) ×2 IMPLANT
CLIP TI WIDE RED SMALL 24 (CLIP) IMPLANT
CONTAINER PROTECT SURGISLUSH (MISCELLANEOUS) ×4 IMPLANT
DEFOGGER ANTIFOG KIT (MISCELLANEOUS) IMPLANT
DERMABOND ADVANCED .7 DNX12 (GAUZE/BANDAGES/DRESSINGS) IMPLANT
DRAPE CARDIOVASCULAR INCISE (DRAPES) ×2
DRAPE SRG 135X102X78XABS (DRAPES) ×2 IMPLANT
DRAPE WARM FLUID 44X44 (DRAPES) ×2 IMPLANT
DRSG COVADERM 4X14 (GAUZE/BANDAGES/DRESSINGS) ×2 IMPLANT
ELECT REM PT RETURN 9FT ADLT (ELECTROSURGICAL) ×2
ELECTRODE REM PT RTRN 9FT ADLT (ELECTROSURGICAL) ×4 IMPLANT
FELT TEFLON 1X6 (MISCELLANEOUS) ×4 IMPLANT
GAUZE 4X4 16PLY ~~LOC~~+RFID DBL (SPONGE) ×2 IMPLANT
GAUZE SPONGE 4X4 12PLY STRL (GAUZE/BANDAGES/DRESSINGS) ×4 IMPLANT
GLOVE BIOGEL PI IND STRL 6 (GLOVE) IMPLANT
GLOVE BIOGEL PI IND STRL 7.5 (GLOVE) IMPLANT
GLOVE SS BIOGEL STRL SZ 7.5 (GLOVE) ×2 IMPLANT
GLOVE SURG SIGNA 7.5 PF LTX (GLOVE) ×6 IMPLANT
GOWN STRL REUS W/ TWL LRG LVL3 (GOWN DISPOSABLE) ×8 IMPLANT
GOWN STRL REUS W/ TWL XL LVL3 (GOWN DISPOSABLE) ×4 IMPLANT
GOWN STRL REUS W/TWL LRG LVL3 (GOWN DISPOSABLE) ×12
GOWN STRL REUS W/TWL XL LVL3 (GOWN DISPOSABLE) ×4
HEMOSTAT POWDER SURGIFOAM 1G (HEMOSTASIS) ×6 IMPLANT
HEMOSTAT SURGICEL 2X14 (HEMOSTASIS) ×2 IMPLANT
INSERT FOGARTY XLG (MISCELLANEOUS) IMPLANT
KIT BASIN OR (CUSTOM PROCEDURE TRAY) ×2 IMPLANT
KIT SUCTION CATH 14FR (SUCTIONS) ×4 IMPLANT
KIT TURNOVER KIT B (KITS) ×2 IMPLANT
KIT VASOVIEW HEMOPRO 2 VH 4000 (KITS) ×2 IMPLANT
MARKER GRAFT CORONARY BYPASS (MISCELLANEOUS) ×6 IMPLANT
NS IRRIG 1000ML POUR BTL (IV SOLUTION) ×10 IMPLANT
PACK E OPEN HEART (SUTURE) ×2 IMPLANT
PACK OPEN HEART (CUSTOM PROCEDURE TRAY) ×2 IMPLANT
PAD ARMBOARD 7.5X6 YLW CONV (MISCELLANEOUS) ×4 IMPLANT
PAD ELECT DEFIB RADIOL ZOLL (MISCELLANEOUS) ×2 IMPLANT
PENCIL BUTTON HOLSTER BLD 10FT (ELECTRODE) ×2 IMPLANT
POSITIONER HEAD DONUT 9IN (MISCELLANEOUS) ×2 IMPLANT
PUNCH AORTIC ROTATE 4.0MM (MISCELLANEOUS) IMPLANT
PUNCH AORTIC ROTATE 4.5MM 8IN (MISCELLANEOUS) IMPLANT
PUNCH AORTIC ROTATE 5MM 8IN (MISCELLANEOUS) IMPLANT
SET MPS 3-ND DEL (MISCELLANEOUS) IMPLANT
SPONGE T-LAP 18X18 ~~LOC~~+RFID (SPONGE) ×8 IMPLANT
SPONGE T-LAP 4X18 ~~LOC~~+RFID (SPONGE) ×2 IMPLANT
SUPPORT HEART JANKE-BARRON (MISCELLANEOUS) ×2 IMPLANT
SUT BONE WAX W31G (SUTURE) ×2 IMPLANT
SUT MNCRL AB 4-0 PS2 18 (SUTURE) IMPLANT
SUT PROLENE 3 0 SH DA (SUTURE) ×2 IMPLANT
SUT PROLENE 4 0 RB 1 (SUTURE) ×4
SUT PROLENE 4 0 SH DA (SUTURE) IMPLANT
SUT PROLENE 4-0 RB1 .5 CRCL 36 (SUTURE) IMPLANT
SUT PROLENE 6 0 C 1 30 (SUTURE) ×4 IMPLANT
SUT PROLENE 7 0 BV1 MDA (SUTURE) ×2 IMPLANT
SUT PROLENE 8 0 BV175 6 (SUTURE) IMPLANT
SUT STEEL 6MS V (SUTURE) ×2 IMPLANT
SUT STEEL STERNAL CCS#1 18IN (SUTURE) IMPLANT
SUT STEEL SZ 6 DBL 3X14 BALL (SUTURE) ×2 IMPLANT
SUT VIC AB 1 CTX 36 (SUTURE) ×4
SUT VIC AB 1 CTX36XBRD ANBCTR (SUTURE) ×4 IMPLANT
SUT VIC AB 2-0 CT1 27 (SUTURE) ×2
SUT VIC AB 2-0 CT1 TAPERPNT 27 (SUTURE) IMPLANT
SUT VIC AB 2-0 CTX 27 (SUTURE) IMPLANT
SUT VIC AB 3-0 SH 27 (SUTURE)
SUT VIC AB 3-0 SH 27X BRD (SUTURE) IMPLANT
SUT VIC AB 3-0 X1 27 (SUTURE) IMPLANT
SUT VICRYL 4-0 PS2 18IN ABS (SUTURE) IMPLANT
SYSTEM SAHARA CHEST DRAIN ATS (WOUND CARE) ×2 IMPLANT
TAPE CLOTH SURG 4X10 WHT LF (GAUZE/BANDAGES/DRESSINGS) IMPLANT
TAPE PAPER 2X10 WHT MICROPORE (GAUZE/BANDAGES/DRESSINGS) IMPLANT
TOWEL GREEN STERILE (TOWEL DISPOSABLE) ×2 IMPLANT
TOWEL GREEN STERILE FF (TOWEL DISPOSABLE) ×2 IMPLANT
TRAY FOLEY SLVR 16FR TEMP STAT (SET/KITS/TRAYS/PACK) ×2 IMPLANT
TUBE SUCT INTRACARD DLP 20F (MISCELLANEOUS) IMPLANT
TUBE SUCTION CARDIAC 10FR (CANNULA) IMPLANT
TUBING LAP HI FLOW INSUFFLATIO (TUBING) ×2 IMPLANT
UNDERPAD 30X36 HEAVY ABSORB (UNDERPADS AND DIAPERS) ×2 IMPLANT
WATER STERILE IRR 1000ML POUR (IV SOLUTION) ×4 IMPLANT

## 2022-08-01 NOTE — Progress Notes (Signed)
  Echocardiogram Echocardiogram Transesophageal has been performed.  Corey [name] 08/01/2022, 9:34 AM

## 2022-08-01 NOTE — Procedures (Signed)
Extubation Procedure Note  Patient Details:   Name: Corey [name] DOB: Apr 14, 1941 MRN: [id number]   Airway Documentation:    Vent end date: 08/01/22 Vent end time: 1700   Evaluation  O2 sats: stable throughout Complications: No apparent complications Patient did tolerate procedure well. Bilateral Breath Sounds: Clear, Diminished   Pt extubated to 4L [place] per rapid wean protocol. NIF was -22 cmH2O, VC was 980 ml. Pt had positive cuff leak prior to extubation. No stridor noted post extubation.  Vilinda Blanks 08/01/2022, 5:03 PM

## 2022-08-01 NOTE — Discharge Instructions (Signed)

## 2022-08-01 NOTE — Progress Notes (Signed)
Neighbor called to receive an update on patient. Unable to speak with the patient at this time. I did call his sister Izora Gala to give neighbors information since the neighbor is caring for his dog at this time. Neighbors number is [phone] Lelan Pons) in case Izora Gala calls back for it.  Rowe Pavy, RN

## 2022-08-01 NOTE — Anesthesia Preprocedure Evaluation (Signed)
Anesthesia Evaluation  Patient identified by MRN, date of birth, ID band Patient awake    Reviewed: Allergy & Precautions, H&P , NPO status , Patient's Chart, lab work & pertinent test results  Airway Mallampati: II  TM Distance: >3 FB Neck ROM: Full    Dental no notable dental hx.    Pulmonary neg pulmonary ROS   Pulmonary exam normal breath sounds clear to auscultation       Cardiovascular hypertension, + CAD, + Past MI and +CHF  Normal cardiovascular exam Rhythm:Regular Rate:Normal  . Left ventricular ejection fraction, by estimation, is 40 to 45%. The  left ventricle has mildly decreased function. The left ventricle  demonstrates regional wall motion abnormalities (see scoring  diagram/findings for description). Left ventricular  diastolic parameters are consistent with Grade I diastolic dysfunction  (impaired relaxation). There is moderate hypokinesis of the left  ventricular, entire anterolateral wall and inferolateral wall.   2. Right ventricular systolic function is normal. The right ventricular  size is normal. Tricuspid regurgitation signal is inadequate for assessing  PA pressure.   3. Left atrial size was mildly dilated.   4. A small pericardial effusion is present. The pericardial effusion is  anterior to the right ventricle.   5. The mitral valve is normal in structure. No evidence of mitral valve  regurgitation.   6. The aortic valve is tricuspid. There is mild calcification of the  aortic valve. There is mild thickening of the aortic valve. Aortic valve  regurgitation is not visualized. Aortic valve sclerosis/calcification is  present, without any evidence of  aortic stenosis.   7. The inferior vena cava is normal in size with greater than 50%  respiratory variability, suggesting right atrial pressure of 3 mmHg.      Neuro/Psych negative neurological ROS  negative psych ROS   GI/Hepatic negative GI ROS,  Neg liver ROS,,,  Endo/Other  negative endocrine ROS    Renal/GU negative Renal ROS  negative genitourinary   Musculoskeletal negative musculoskeletal ROS (+)    Abdominal   Peds negative pediatric ROS (+)  Hematology negative hematology ROS (+) CLL  Platelets 124K   Anesthesia Other Findings   Reproductive/Obstetrics negative OB ROS                             Anesthesia Physical Anesthesia Plan  ASA: 4  Anesthesia Plan: General   Post-op Pain Management: Minimal or no pain anticipated   Induction: Intravenous  PONV Risk Score and Plan: 2 and Ondansetron, Dexamethasone and Treatment may vary due to age or medical condition  Airway Management Planned: Oral ETT  Additional Equipment: Arterial line, PA Cath, TEE, Ultrasound Guidance Line Placement and CVP  Intra-op Plan:   Post-operative Plan: Post-operative intubation/ventilation  Informed Consent: I have reviewed the patients History and Physical, chart, labs and discussed the procedure including the risks, benefits and alternatives for the proposed anesthesia with the patient or authorized representative who has indicated his/her understanding and acceptance.     Dental advisory given  Plan Discussed with: CRNA and Surgeon  Anesthesia Plan Comments:        Anesthesia Quick Evaluation

## 2022-08-01 NOTE — Progress Notes (Signed)
Heart Failure Navigator Progress Note  Following this hospitalization to assess for HV TOC readiness.   STEMI CABG 11/8 ECHO?  Earnestine Leys, BSN, Clinical cytogeneticist Only

## 2022-08-01 NOTE — Anesthesia Procedure Notes (Signed)
Arterial Line Insertion Start/End11/04/2022 8:10 AM, 08/01/2022 8:13 AM Performed by: Amadeo Garnet, CRNA, CRNA  Patient location: Pre-op. Preanesthetic checklist: patient identified, IV checked, site marked, risks and benefits discussed, surgical consent, monitors and equipment checked, pre-op evaluation, timeout performed and anesthesia consent Lidocaine 1% used for infiltration and patient sedated Left, radial was placed Catheter size: 20 G Hand hygiene performed  and maximum sterile barriers used   Attempts: 2 Procedure performed without using ultrasound guided technique. Following insertion, dressing applied and Biopatch. Patient tolerated the procedure well with no immediate complications.

## 2022-08-01 NOTE — Progress Notes (Signed)
EVENING ROUNDS NOTE :     [address]       [place] [address]             [phone]                 Day of Surgery Procedure(s) (LRB): 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 USING LEFT INTERNAL MAMMARY AND RIGHT SAPHENOUS LEG VEIN HARVESTED ENDOSCOPICALLY (N/A) TRANSESOPHAGEAL ECHOCARDIOGRAM (TEE) (N/A)   Total Length of Stay:  LOS: 1 day  Events:   No events Weaning to extubated Stable HD Low CT output    BP 93/62   Pulse 79   Temp 99 F (37.2 C)   Resp (!) 21   Ht '5\' 9"'$  (1.753 m)   Wt 72.6 kg   SpO2 96%   BMI 23.63 kg/m   PAP: (23-29)/(1-18) 24/14 CO:  [3.7 L/min-4 L/min] 3.7 L/min CI:  [2 L/min/m2-2.1 L/min/m2] 2 L/min/m2  Vent Mode: PSV;CPAP FiO2 (%):  [40 %-50 %] 40 % Set Rate:  [4 bmp-12 bmp] 4 bmp Vt Set:  [560 mL] 560 mL PEEP:  [5 cmH20] 5 cmH20 Pressure Support:  [10 cmH20] 10 cmH20 Plateau Pressure:  [15 cmH20] 15 cmH20   sodium chloride 20 mL/hr at 08/01/22 1600   [START ON 08/02/2022] sodium chloride     sodium chloride 20 mL/hr at 08/01/22 1600   albumin human 60 mL/hr at 08/01/22 1600    ceFAZolin (ANCEF) IV Stopped (08/01/22 1439)   dexmedetomidine (PRECEDEX) IV infusion     famotidine (PEPCID) IV Stopped (08/01/22 1402)   insulin Stopped (08/01/22 1550)   lactated ringers     lactated ringers Stopped (08/01/22 1342)   lactated ringers 20 mL/hr at 08/01/22 1600   magnesium sulfate 20 mL/hr at 08/01/22 1600   nitroGLYCERIN Stopped (08/01/22 1300)   phenylephrine (NEO-SYNEPHRINE) Adult infusion 40 mcg/min (08/01/22 1300)   vancomycin      I/O last 3 completed shifts: In: 931.1 [I.V.:931.1] Out: 280 [Urine:280]      Latest Ref Rng & Units 08/01/2022    1:10 PM 08/01/2022   12:10 PM 08/01/2022   12:01 PM  CBC  WBC 4.0 - 10.5 K/uL 29.1     Hemoglobin 13.0 - 17.0 g/dL 12.0  10.5  10.2   Hematocrit 39.0 - 52.0 % 34.4  31.0  30.0   Platelets 150 - 400 K/uL 93          Latest Ref Rng & Units 08/01/2022    12:10 PM 08/01/2022   12:01 PM 08/01/2022   11:27 AM  BMP  Glucose 70 - 99 mg/dL  143    BUN 8 - 23 mg/dL  7    Creatinine 0.61 - 1.24 mg/dL  0.70    Sodium 135 - 145 mmol/L 137  137  136   Potassium 3.5 - 5.1 mmol/L 4.1  4.1  4.3   Chloride 98 - 111 mmol/L  101      ABG    Component Value Date/Time   PHART 7.413 08/01/2022 1210   PCO2ART 34.1 08/01/2022 1210   PO2ART 137 (H) 08/01/2022 1210   HCO3 21.7 08/01/2022 1210   TCO2 23 08/01/2022 1210   ACIDBASEDEF 2.0 08/01/2022 1210   O2SAT 99 08/01/2022 1210       Melodie Bouillon, MD 08/01/2022 4:57 PM

## 2022-08-01 NOTE — Interval H&P Note (Signed)
History and Physical Interval Note:  08/01/2022 7:25 AM  Corey [name]  has presented today for surgery, with the diagnosis of CAD LMD.  The various methods of treatment have been discussed with the patient and family. After consideration of risks, benefits and other options for treatment, the patient has consented to  Procedure(s): CORONARY ARTERY BYPASS GRAFTING (CABG) (N/A) TRANSESOPHAGEAL ECHOCARDIOGRAM (TEE) (N/A) as a surgical intervention.  [name] history has been reviewed, patient examined, no change in status, stable for surgery.  I have reviewed the patient's chart and labs.  Questions were answered to the patient's satisfaction.     Melrose Nakayama

## 2022-08-01 NOTE — Anesthesia Procedure Notes (Addendum)
Procedure Name: Intubation Date/Time: 08/01/2022 8:48 AM  Performed by: Amadeo Garnet, CRNAPre-anesthesia Checklist: Patient identified, Emergency Drugs available, Suction available and Patient being monitored Patient Re-evaluated:Patient Re-evaluated prior to induction Oxygen Delivery Method: Circle system utilized Preoxygenation: Pre-oxygenation with 100% oxygen Induction Type: IV induction Ventilation: Mask ventilation without difficulty Laryngoscope Size: Mac and 4 Grade View: Grade I Tube type: Oral Tube size: 8.0 mm Number of attempts: 1 Airway Equipment and Method: Stylet and Oral airway Placement Confirmation: ETT inserted through vocal cords under direct vision, positive ETCO2 and breath sounds checked- equal and bilateral Secured at: 22 cm Tube secured with: Tape Dental Injury: Teeth and Oropharynx as per pre-operative assessment

## 2022-08-01 NOTE — Hospital Course (Addendum)
History of Present Illness:  [name] is an 81 year old man with a history of hypertension, hyperlipidemia, CLL, gout, diverticulitis, and BPH.  He has no prior cardiac history.  He does have a family history of cardiac disease with his father dying while awaiting bypass surgery at age 57.   On the morning of 07/31/2022 he was walking his dog.  He developed a severe central chest pain with radiation to both arms associated with shortness of breath.  He called EMS.  EKG showed diffuse ST depression and ST elevation in aVR.  Code STEMI was called.  His pain was better by the time he arrived he was taken emergently to the catheterization laboratory by Dr. Burt Knack.  He was found to have 17- 70% left main stenosis, 70% LAD stenosis, and 95% ostial stenosis of the circumflex not amenable to percutaneous intervention.  No disease in a large dominant right coronary.  Echocardiogram showed an EF of 40 to 45% with aortic sclerosis but no stenosis.  It was felt cardiothoracic surgery consultation would be indicated.    Hospital Course:  He was evaluated by Dr. Roxan Hockey who felt patient would benefit from coronary bypass grafting.  He denied chest pain since catheterization.  The risks and benefits of the procedure were explained to the patient and he was agreeable to proceed.  He was taken to the operating room and underwent CABG x 2 utilizing LIMA to LAD and SVG to Ramus.  He also underwent endoscopic harvest of greater saphenous vein from his right thigh.  He tolerated the procedure without difficulty and was taken to the SICU in stable condition.  The patient was extubated the evening of surgery.  He experienced some mild confusion overnight.  He was weaned off Neo-synephrine as hemodynamics allowed.  He was started on lasix for volume overloaded state.  His chest tubes and arterial lines were removed without difficulty.  He had leukocytosis which was felt to be reactive due to surgery and underlying CLL.  His  post operative CXR showed gastric dilatation.  He was treated with reglan and diet was advanced slowly.  He was in NSR with PACs.  His leukocyte count improved to baseline.  He was felt stable for transfer to the progressive care unit on 08/03/2022.

## 2022-08-01 NOTE — Anesthesia Postprocedure Evaluation (Signed)
Anesthesia Post Note  Patient: Corey [name]  Procedure(s) Performed: 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 USING LEFT INTERNAL MAMMARY AND RIGHT SAPHENOUS LEG VEIN HARVESTED ENDOSCOPICALLY (Chest) TRANSESOPHAGEAL ECHOCARDIOGRAM (TEE)     Patient location during evaluation: SICU Anesthesia Type: General Level of consciousness: sedated Pain management: pain level controlled Vital Signs Assessment: post-procedure vital signs reviewed and stable Respiratory status: patient remains intubated per anesthesia plan Cardiovascular status: stable Postop Assessment: no apparent nausea or vomiting Anesthetic complications: no  No notable events documented.  Last Vitals:  Vitals:   08/01/22 1605 08/01/22 1627  BP:    Pulse:  79  Resp:  (!) 21  Temp:  37.2 C  SpO2: 96% 96%    Last Pain:  Vitals:   08/01/22 1445  TempSrc:   PainSc: 0-No pain                 Aarian Griffie S

## 2022-08-01 NOTE — Transfer of Care (Signed)
Immediate Anesthesia Transfer of Care Note  Patient: LAMAR METER  Procedure(s) Performed: 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 USING LEFT INTERNAL MAMMARY AND RIGHT SAPHENOUS LEG VEIN HARVESTED ENDOSCOPICALLY (Chest) TRANSESOPHAGEAL ECHOCARDIOGRAM (TEE)  Patient Location: SICU  Anesthesia Type:General  Level of Consciousness: Patient remains intubated per anesthesia plan  Airway & Oxygen Therapy: Patient remains intubated per anesthesia plan and Patient placed on Ventilator (see vital sign flow sheet for setting)  Post-op Assessment: Report given to RN and Post -op Vital signs reviewed and stable  Post vital signs: Reviewed and stable  Last Vitals:  Vitals Value Taken Time  BP    Temp 36.4 C 08/01/22 1309  Pulse 80 08/01/22 1309  Resp 14 08/01/22 1309  SpO2 94 % 08/01/22 1309  Vitals shown include unvalidated device data.  Last Pain:  Vitals:   08/01/22 0400  TempSrc:   PainSc: 0-No pain         Complications: No notable events documented.

## 2022-08-01 NOTE — Brief Op Note (Addendum)
07/31/2022 - 08/01/2022  1:08 PM  PATIENT:  Corey [name]  81 y.o. male  PRE-OPERATIVE DIAGNOSIS:  CAD  POST-OPERATIVE DIAGNOSIS:  CAD  PROCEDURE:   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 USING LEFT INTERNAL MAMMARY AND RIGHT SAPHENOUS LEG VEIN HARVESTED ENDOSCOPICALLY -SVG to Ramus -LIMA to LAD  TRANSESOPHAGEAL ECHOCARDIOGRAM (TEE)  Vein harvest time: 68mn Vein prep time: 135m  SURGEON:  Surgeon(s) and Role: HeMelrose NakayamaMD - Primary  PHYSICIAN ASSISTANT: BaWynelle BeckmannA-C  ASSISTANTS: ChMellody MemosNFA   ANESTHESIA:   general  EBL:  761 mL  BLOOD ADMINISTERED:none  DRAINS:  Mediastinal and pleural chest tubes    LOCAL MEDICATIONS USED:  NONE  SPECIMEN:  No Specimen  DISPOSITION OF SPECIMEN:  N/A  COUNTS CORRECT:  YES  DICTATION: .Dragon Dictation  PLAN OF CARE: Admit to inpatient   PATIENT DISPOSITION:  ICU - intubated and hemodynamically stable.   Delay start of Pharmacological VTE agent (>24hrs) due to surgical blood loss or risk of bleeding: yes

## 2022-08-02 ENCOUNTER — Inpatient Hospital Stay (HOSPITAL_COMMUNITY): Payer: Medicare Other

## 2022-08-02 ENCOUNTER — Encounter (HOSPITAL_COMMUNITY): Payer: Self-pay | Admitting: Thoracic Surgery (Cardiothoracic Vascular Surgery)

## 2022-08-02 DIAGNOSIS — I2121 ST elevation (STEMI) myocardial infarction involving left circumflex coronary artery: Secondary | ICD-10-CM | POA: Diagnosis not present

## 2022-08-02 DIAGNOSIS — Z951 Presence of aortocoronary bypass graft: Secondary | ICD-10-CM

## 2022-08-02 LAB — CBC
HCT: 35.5 % — ABNORMAL LOW (ref 39.0–52.0)
HCT: 34.4 % — ABNORMAL LOW (ref 39.0–52.0)
Hemoglobin: 12.2 g/dL — ABNORMAL LOW (ref 13.0–17.0)
Hemoglobin: 11.1 g/dL — ABNORMAL LOW (ref 13.0–17.0)
MCH: 33.1 pg (ref 26.0–34.0)
MCH: 31.4 pg (ref 26.0–34.0)
MCHC: 34.4 g/dL (ref 30.0–36.0)
MCHC: 32.3 g/dL (ref 30.0–36.0)
MCV: 96.2 fL (ref 80.0–100.0)
MCV: 97.2 fL (ref 80.0–100.0)
Platelets: 122 10*3/uL — ABNORMAL LOW (ref 150–400)
Platelets: 104 10*3/uL — ABNORMAL LOW (ref 150–400)
RBC: 3.69 MIL/uL — ABNORMAL LOW (ref 4.22–5.81)
RBC: 3.54 MIL/uL — ABNORMAL LOW (ref 4.22–5.81)
RDW: 13.6 % (ref 11.5–15.5)
RDW: 13.8 % (ref 11.5–15.5)
WBC: 41.1 10*3/uL — ABNORMAL HIGH (ref 4.0–10.5)
WBC: 31.2 10*3/uL — ABNORMAL HIGH (ref 4.0–10.5)
nRBC: 0 % (ref 0.0–0.2)
nRBC: 0 % (ref 0.0–0.2)

## 2022-08-02 LAB — BASIC METABOLIC PANEL
Anion gap: 4 — ABNORMAL LOW (ref 5–15)
Anion gap: 8 (ref 5–15)
BUN: 9 mg/dL (ref 8–23)
BUN: 11 mg/dL (ref 8–23)
CO2: 24 mmol/L (ref 22–32)
CO2: 23 mmol/L (ref 22–32)
Calcium: 7.2 mg/dL — ABNORMAL LOW (ref 8.9–10.3)
Calcium: 7.6 mg/dL — ABNORMAL LOW (ref 8.9–10.3)
Chloride: 106 mmol/L (ref 98–111)
Chloride: 102 mmol/L (ref 98–111)
Creatinine, Ser: 1.02 mg/dL (ref 0.61–1.24)
Creatinine, Ser: 1.11 mg/dL (ref 0.61–1.24)
GFR, Estimated: >60 mL/min (ref >60)
GFR, Estimated: >60 mL/min (ref >60)
Glucose, Bld: 117 mg/dL — ABNORMAL HIGH (ref 70–99)
Glucose, Bld: 109 mg/dL — ABNORMAL HIGH (ref 70–99)
Potassium: 4.4 mmol/L (ref 3.5–5.1)
Potassium: 4.1 mmol/L (ref 3.5–5.1)
Sodium: 134 mmol/L — ABNORMAL LOW (ref 135–145)
Sodium: 133 mmol/L — ABNORMAL LOW (ref 135–145)

## 2022-08-02 LAB — GLUCOSE, CAPILLARY
Glucose-Capillary: 101 mg/dL — ABNORMAL HIGH (ref 70–99)
Glucose-Capillary: 102 mg/dL — ABNORMAL HIGH (ref 70–99)
Glucose-Capillary: 107 mg/dL — ABNORMAL HIGH (ref 70–99)
Glucose-Capillary: 109 mg/dL — ABNORMAL HIGH (ref 70–99)
Glucose-Capillary: 113 mg/dL — ABNORMAL HIGH (ref 70–99)
Glucose-Capillary: 116 mg/dL — ABNORMAL HIGH (ref 70–99)
Glucose-Capillary: 121 mg/dL — ABNORMAL HIGH (ref 70–99)
Glucose-Capillary: 129 mg/dL — ABNORMAL HIGH (ref 70–99)
Glucose-Capillary: 137 mg/dL — ABNORMAL HIGH (ref 70–99)
Glucose-Capillary: 95 mg/dL (ref 70–99)
Glucose-Capillary: 97 mg/dL (ref 70–99)

## 2022-08-02 LAB — MAGNESIUM
Magnesium: 2.3 mg/dL (ref 1.7–2.4)
Magnesium: 2.2 mg/dL (ref 1.7–2.4)

## 2022-08-02 LAB — LIPOPROTEIN A (LPA): Lipoprotein (a): 10.8 nmol/L (ref <75.0)

## 2022-08-02 LAB — PATHOLOGIST SMEAR REVIEW

## 2022-08-02 MED ORDER — FUROSEMIDE 10 MG/ML IJ SOLN
20.0000 mg | Freq: Two times a day (BID) | INTRAMUSCULAR | Status: AC
  Administered 2022-08-02 (×2): 20 mg via INTRAVENOUS
  Filled 2022-08-02 (×2): qty 2

## 2022-08-02 MED ORDER — GUAIFENESIN ER 600 MG PO TB12
600.0000 mg | ORAL_TABLET | Freq: Two times a day (BID) | ORAL | Status: DC | PRN
  Administered 2022-08-02 – 2022-08-05 (×4): 600 mg via ORAL
  Filled 2022-08-02 (×4): qty 1

## 2022-08-02 MED ORDER — ALBUMIN HUMAN 5 % IV SOLN
12.5000 g | Freq: Once | INTRAVENOUS | Status: AC
  Administered 2022-08-02: 12.5 g via INTRAVENOUS
  Filled 2022-08-02: qty 250

## 2022-08-02 MED ORDER — ENOXAPARIN SODIUM 40 MG/0.4ML IJ SOSY
40.0000 mg | PREFILLED_SYRINGE | Freq: Every day | INTRAMUSCULAR | Status: DC
  Administered 2022-08-02 – 2022-08-05 (×4): 40 mg via SUBCUTANEOUS
  Filled 2022-08-02 (×4): qty 0.4

## 2022-08-02 MED ORDER — METOCLOPRAMIDE HCL 5 MG/ML IJ SOLN
10.0000 mg | Freq: Four times a day (QID) | INTRAMUSCULAR | Status: AC
  Administered 2022-08-02 – 2022-08-03 (×4): 10 mg via INTRAVENOUS
  Filled 2022-08-02 (×4): qty 2

## 2022-08-02 MED ORDER — INSULIN ASPART 100 UNIT/ML IJ SOLN: 0.0000 [IU] | INTRAMUSCULAR | Status: DC

## 2022-08-02 NOTE — Discharge Summary (Signed)
[address]       [place] [address]             [phone]    Physician Discharge Summary  Patient ID: Corey [name] MRN: [id number] DOB/AGE: [birth year] 82 y.o.  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 Villavicencio is an 81 year old man with a history of hypertension, hyperlipidemia, CLL, gout, diverticulitis, and BPH.  He has no prior cardiac history.  He does have a family history of cardiac disease with his father dying while awaiting bypass surgery at age 61.   On the morning of 07/31/2022 he was walking his dog.  He developed a severe central chest pain with radiation to both arms associated with shortness of breath.  He called EMS.  EKG showed diffuse ST depression and ST elevation in aVR.  Code STEMI was called.  His pain was better by the time he arrived he was taken emergently to the catheterization laboratory by Dr. Burt Knack.  He was found to have 50- 70% left main stenosis, 70% LAD stenosis, and 95% ostial stenosis of the circumflex not amenable to percutaneous intervention.  No disease in a large dominant right coronary.   Echocardiogram showed an EF of 40 to 45% with aortic sclerosis but no stenosis.  It was felt cardiothoracic surgery consultation would be indicated.    Hospital Course:  He was evaluated by Dr. Roxan Hockey who felt patient would benefit from coronary bypass grafting.  He denied chest pain since catheterization.  The risks and benefits of the procedure were explained to the patient and he was agreeable to proceed.  He was taken to the operating room and underwent CABG x 2 utilizing LIMA to LAD and SVG to Ramus.  He also underwent endoscopic harvest of greater saphenous vein from his right thigh.  He tolerated the procedure without difficulty and was taken to the SICU in stable condition.  The patient was extubated the evening of surgery.  He experienced some mild confusion overnight.  He was weaned off Neo-synephrine as hemodynamics allowed.  He was started on lasix for volume overloaded state.  His chest tubes and arterial lines were removed without difficulty.  He had leukocytosis which was felt to be reactive due to surgery and underlying CLL.  His post operative CXR showed gastric dilatation.  He was treated with reglan and diet was advanced slowly.  He was in NSR with PACs.  His leukocyte count improved to baseline.  He was felt stable for transfer to the progressive care unit on 08/03/2022.  The patient continued to make progress.  He was maintaining NSR and his pacing wires were  removed without difficulty.  PT/OT recommended home health, however patient did not wish for this to be arranged.  The patient developed confusion and agitation overnight.  He was actually combative towards staff and required treatment with Haldol.  He developed difficulty expectorating sputum.  He was treated with Mucinex and a dose of Mucomyst.    The patient is ambulating.  His surgical incisions are healing without evidence of infection.  He is medically stable for discharge home today.   Significant Diagnostic Studies:  angiography:     Ost LAD to Prox LAD lesion is 70% stenosed.   Mid LM to Ost LAD lesion is 70% stenosed.   Ost Cx to Prox Cx lesion is 95% stenosed.   The left ventricular systolic function is normal.   LV end diastolic pressure is normal.   The left ventricular ejection fraction is 55-65% by visual estimate.   1.  Thrombotic, ulcerated severe stenosis of the ostial/proximal circumflex (culprit lesion) 2.  Moderately severe calcific distal left main stenosis of 70% 3.  Moderately severe calcified proximal LAD stenosis of 70% 4.  Mild diffuse nonobstructive plaquing of a large, dominant RCA which supplies a large PDA branch and multiple posterolateral branches 5.  Normal LV systolic function with no regional wall motion abnormalities   Recommendations: Difficult anatomy for PCI due to calcific distal left main and ostial LAD stenosis and severe ostial circumflex stenosis.  Cardiac surgery called and evaluated the patient's cath films with me while he was on the table.  We agree that he is best suited for coronary bypass surgery.  He will be continued on heparin and will have formal cardiac surgical consultation.  At the completion of the procedure, the patient is chest pain-free and his ST segments have normalized.  Treatments: surgery:   Operative Report    DATE OF PROCEDURE: 08/01/2022   PREOPERATIVE DIAGNOSES:  Severe left main and 2-vessel coronary disease, status post non-ST elevation myocardial infarction.   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   PROCEDURE:  Median sternotomy, extracorporeal circulation, coronary artery bypass grafting x2 (left internal mammary artery to LAD, saphenous vein graft to ramus intermedius), endoscopic vein harvest right thigh.   SURGEON:  Revonda Standard. Roxan Hockey, MD   ASSISTANT:  Wynelle Beckmann, PA  Discharge Exam: Blood pressure 127/83, pulse 68, temperature 97.9  F (36.6 C), temperature source Oral, resp. rate 16, height _0  (1.753 m), weight 71.6 kg, SpO2 96 %.  General appearance: alert, cooperative, and no distress Heart: regular rate and rhythm Lungs: clear to auscultation bilaterally Abdomen: soft, non-tender; bowel sounds normal; no masses,  no organomegaly Extremities: edema none present Wound: clean and dry  Discharge Medications:  The patient has been discharged on:   1.Beta Blocker:  Yes [ X  ]                              No   [   ]                              If No, reason:  2.Ace Inhibitor/ARB: Yes [   ]                                     No  [  X   ]                                     If No, reason:titration of BB  3.Statin:   Yes [ X  ]                  No  [   ]                  If No, reason:  4.Ecasa:  Yes  [ X  ]                  No   [   ]                  If No, reason:  Patient had ACS upon admission:Yes  Plavix/P2Y12 inhibitor: Yes [  X ]                                      No  [   ]   Discharge Instructions     Amb Referral to Cardiac Rehabilitation   Complete by: As directed    Diagnosis: CABG   CABG X ___: 2   After initial evaluation and assessments completed: Virtual Based Care may be provided alone or in conjunction with Phase 2 Cardiac Rehab based on patient barriers.: Yes   Intensive Cardiac Rehabilitation (ICR) [place] location only OR Traditional Cardiac Rehabilitation (TCR) *If criteria for ICR are not met will enroll in TCR [facility] only): Yes      Allergies as of 08/06/2022   No Known Allergies      Medication List     STOP taking these medications    amLODipine 5 MG tablet Commonly known as: NORVASC       TAKE these medications    acetaminophen 500 MG tablet Commonly known as: TYLENOL Take 2 tablets (1,000 mg total) by mouth every 6 (six) hours as needed.   allopurinol 300 MG tablet Commonly known as: ZYLOPRIM Take 300 mg by mouth daily.   aspirin EC 81 MG  tablet Take 1 tablet (81 mg total) by mouth daily.   clopidogrel 75 MG tablet Commonly known as: Plavix Take 1 tablet (75 mg total) by mouth daily.   finasteride 5 MG tablet Commonly known as: PROSCAR Take 5 mg by mouth daily.   metoprolol tartrate 25 MG tablet Commonly known as: LOPRESSOR Take 1 tablet (25 mg total) by mouth 2 (two) times daily.   omeprazole 40 MG capsule Commonly known as: PRILOSEC Take 40 mg by mouth daily.   pravastatin 40 MG tablet Commonly known as: PRAVACHOL Take 20 mg by mouth daily.   tamsulosin 0.4 MG Caps capsule Commonly known as: FLOMAX Take 0.4 mg by mouth daily after supper.   traMADol 50 MG tablet Commonly known as: ULTRAM Take 1 tablet (50 mg total) by mouth every 6 (six) hours as needed for severe pain.   Vitamin B 12 500 MCG Tabs Take 1 tablet by mouth daily.        Follow-up Information     Melrose Nakayama, MD Follow up on 08/28/2022.   Specialty: Cardiothoracic Surgery Why: Appointment is at 12:30 Contact information: [place] [address] [phone]  [facility] Follow up on 08/28/2022.   Why: Please arrive at 11:00 for chest xray Contact information: [place]        Emmaline Life, NP Follow up on 08/20/2022.   Specialty: Nurse Practitioner Why: Appointment is at 10:55 Contact information: [place] [address] [phone]                 Signed:  Ellwood Handler, PA-C  08/06/2022, 9:44 AM

## 2022-08-02 NOTE — Progress Notes (Signed)
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 USING LEFT INTERNAL MAMMARY AND RIGHT SAPHENOUS LEG VEIN HARVESTED ENDOSCOPICALLY (N/A) TRANSESOPHAGEAL ECHOCARDIOGRAM (TEE) (N/A) Subjective: No complaints but was noted to have some mild confusion overnight  Objective: Vital signs in last 24 hours: Temp:  [97.3 F (36.3 C)-100.6 F (38.1 C)] 98.8 F (37.1 C) (11/09 0700) Pulse Rate:  [54-89] 88 (11/09 0700) Cardiac Rhythm: Atrial paced (11/09 0430) Resp:  [5-29] 12 (11/09 0700) BP: (79-123)/(44-96) 104/72 (11/09 0600) SpO2:  [79 %-98 %] 94 % (11/09 0700) Arterial Line BP: (85-145)/(41-83) 119/54 (11/09 0700) FiO2 (%):  [40 %-50 %] 40 % (11/08 1627) Weight:  [76.4 kg] 76.4 kg (11/09 0500)  Hemodynamic parameters for last 24 hours: PAP: (18-37)/(1-30) 24/18 CO:  [3.6 L/min-6.2 L/min] 5 L/min CI:  [1.9 L/min/m2-3.3 L/min/m2] 2.6 L/min/m2  Intake/Output from previous day: 11/08 0701 - 11/09 0700 In: 4613.4 [I.V.:2633.8; Blood:375; IV Piggyback:1604.6] Out: 3086 [Urine:2520; Blood:761; Chest Tube:304] Intake/Output this shift: No intake/output data recorded.  General appearance: alert, cooperative, and no distress Neurologic: intact and oriented Heart: regular rate and rhythm Lungs: diminished breath sounds bibasilar Abdomen: normal findings: soft, non-tender  Lab Results: Recent Labs    08/01/22 1849 08/02/22 0405  WBC 29.9* 41.1*  HGB 12.8* 12.2*  HCT 36.9* 35.5*  PLT 104* 122*   BMET:  Recent Labs    08/01/22 1849 08/02/22 0405  NA 137 134*  K 4.4 4.4  CL 110 106  CO2 20* 24  GLUCOSE 140* 117*  BUN 8 9  CREATININE 0.93 1.02  CALCIUM 7.2* 7.2*    PT/INR:  Recent Labs    08/01/22 1310  LABPROT 17.5*  INR 1.5*   ABG    Component Value Date/Time   PHART 7.320 (L) 08/01/2022 1801   HCO3 19.7 (L) 08/01/2022 1801   TCO2 21 (L) 08/01/2022 1801   ACIDBASEDEF 6.0 (H) 08/01/2022 1801   O2SAT 92 08/01/2022 1801   CBG  (last 3)  Recent Labs    08/02/22 0602 08/02/22 0655 08/02/22 0800  GLUCAP 116* 113* 107*    Assessment/Plan: S/P Procedure(s) (LRB): 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 USING LEFT INTERNAL MAMMARY AND RIGHT SAPHENOUS LEG VEIN HARVESTED ENDOSCOPICALLY (N/A) TRANSESOPHAGEAL ECHOCARDIOGRAM (TEE) (N/A) POD # 1 NEURO- no focal deficits and appropriate this AM, some confusion last night, minimize narcotics CV- in SB, paced for rate- hold beta blocker this AM  Good hemodynamics- wean Neo, dc Swan and A line  ASA, statin, add Plavix PTD RESP- IS for atelectasis, mucinex for congestion RENAL- creatinine normal, lytes OK  Diurese for fluid overload ENDO- CBG well controlled  Transition from drip to Q4 CBG/ SSI Gi- gastric dilatation on CXR- no nausea  Keep on clears this AM  Reglan Leukocytosis- WBC elevated over baseline, 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

## 2022-08-02 NOTE — Progress Notes (Signed)
Rounding Note    Patient Name: Corey [name] Date of Encounter: 08/02/2022  [place] Cardiologist: None   Subjective   Chest sore. No other complaints. Seems confused.  Inpatient Medications    Scheduled Meds:  acetaminophen  1,000 mg Oral Q6H   Or   acetaminophen (TYLENOL) oral liquid 160 mg/5 mL  1,000 mg Per Tube Q6H   aspirin EC  325 mg Oral Daily   Or   aspirin  324 mg Per Tube Daily   atorvastatin  80 mg Oral Daily   bisacodyl  10 mg Oral Daily   Or   bisacodyl  10 mg Rectal Daily   Chlorhexidine Gluconate Cloth  6 each Topical Daily   docusate sodium  200 mg Oral Daily   enoxaparin (LOVENOX) injection  40 mg Subcutaneous QHS   finasteride  5 mg Oral Daily   furosemide  20 mg Intravenous BID   insulin aspart  0-24 Units Subcutaneous Q4H   metoCLOPramide (REGLAN) injection  10 mg Intravenous Q6H   metoprolol tartrate  12.5 mg Oral BID   Or   metoprolol tartrate  12.5 mg Per Tube BID   mupirocin ointment  1 Application Nasal BID   mouth rinse  15 mL Mouth Rinse QID   [START ON 08/03/2022] pantoprazole  40 mg Oral Daily   sodium chloride flush  3 mL Intravenous Q12H   tamsulosin  0.4 mg Oral QPC supper   Continuous Infusions:  sodium chloride 20 mL/hr at 08/02/22 0700   sodium chloride     sodium chloride 20 mL/hr at 08/02/22 0700   albumin human      ceFAZolin (ANCEF) IV Stopped (08/02/22 0536)   dexmedetomidine (PRECEDEX) IV infusion Stopped (08/01/22 1721)   famotidine (PEPCID) IV Stopped (08/01/22 1402)   lactated ringers     lactated ringers Stopped (08/01/22 1342)   lactated ringers 20 mL/hr at 08/02/22 0700   nitroGLYCERIN Stopped (08/01/22 1300)   phenylephrine (NEO-SYNEPHRINE) Adult infusion 30 mcg/min (08/02/22 0700)   PRN Meds: sodium chloride, guaiFENesin, lactated ringers, metoprolol tartrate, midazolam, morphine injection, ondansetron (ZOFRAN) IV, mouth rinse, oxyCODONE, sodium chloride flush, traMADol   Vital Signs     Vitals:   08/02/22 0615 08/02/22 0630 08/02/22 0645 08/02/22 0700  BP:      Pulse: 88 88 88 88  Resp: '19 14 17 12  '$ Temp: 98.6 F (37 C) 98.6 F (37 C) 98.8 F (37.1 C) 98.8 F (37.1 C)  TempSrc:      SpO2: (!) 88% 90% 93% 94%  Weight:      Height:        Intake/Output Summary (Last 24 hours) at 08/02/2022 0944 Last data filed at 08/02/2022 0700 Gross per 24 hour  Intake 4363.36 ml  Output 3435 ml  Net 928.36 ml      08/02/2022    5:00 AM 07/31/2022    8:07 AM 05/23/2022    1:01 PM  Last 3 Weights  Weight (lbs) 168 lb 6.9 oz 160 lb 163 lb 3.2 oz  Weight (kg) 76.4 kg 72.576 kg 74.027 kg      Telemetry    A-paced no significant arrhythmia - Personally Reviewed   Physical Exam  Alert, mildly confused GEN: No acute distress.   Neck: No JVD Cardiac: RRR, no murmurs, rubs, or gallops.  Respiratory: Clear to auscultation bilaterally. GI: Soft, nontender, non-distended  MS: No edema; No deformity. Neuro:  Nonfocal  Psych: Normal affect   Labs  High Sensitivity Troponin:   Recent Labs  Lab 07/31/22 0759  TROPONINIHS 80*     Chemistry Recent Labs  Lab 07/31/22 0759 08/01/22 0603 08/01/22 0854 08/01/22 1201 08/01/22 1210 08/01/22 1801 08/01/22 1849 08/02/22 0405  NA 140 138   < > 137   < > 136 137 134*  K 4.9 4.7   < > 4.1   < > 4.2 4.4 4.4  CL 106 107   < > 101  --   --  110 106  CO2 24 26  --   --   --   --  20* 24  GLUCOSE 125* 99   < > 143*  --   --  140* 117*  BUN 9 9   < > 7*  --   --  8 9  CREATININE 1.04 1.12   < > 0.70  --   --  0.93 1.02  CALCIUM 8.8* 8.5*  --   --   --   --  7.2* 7.2*  MG  --   --   --   --   --   --   --  2.3  PROT 5.8*  --   --   --   --   --   --   --   ALBUMIN 3.4*  --   --   --   --   --   --   --   AST 29  --   --   --   --   --   --   --   ALT 22  --   --   --   --   --   --   --   ALKPHOS 47  --   --   --   --   --   --   --   BILITOT 1.2  --   --   --   --   --   --   --   GFRNONAA >60 >60  --   --   --   --   >60 >60  ANIONGAP 10 5  --   --   --   --  7 4*   < > = values in this interval not displayed.    Lipids  Recent Labs  Lab 07/31/22 0759  CHOL 166  TRIG 109  HDL 45  LDLCALC 99  CHOLHDL 3.7    Hematology Recent Labs  Lab 08/01/22 1310 08/01/22 1312 08/01/22 1801 08/01/22 1849 08/02/22 0405  WBC 29.1*  --   --  29.9* 41.1*  RBC 3.67*  --   --  3.93* 3.69*  HGB 12.0*   < > 12.9* 12.8* 12.2*  HCT 34.4*   < > 38.0* 36.9* 35.5*  MCV 93.7  --   --  93.9 96.2  MCH 32.7  --   --  32.6 33.1  MCHC 34.9  --   --  34.7 34.4  RDW 13.4  --   --  13.4 13.6  PLT 93*  --   --  104* 122*   < > = values in this interval not displayed.   Thyroid No results for input(s): "TSH", "FREET4" in the last 168 hours.  BNPNo results for input(s): "BNP", "PROBNP" in the last 168 hours.  DDimer No results for input(s): "DDIMER" in the last 168 hours.   Radiology    DG Chest Port 1 View  Result Date: 08/02/2022 CLINICAL DATA:  Status  post CABG. EXAM: PORTABLE CHEST 1 VIEW COMPARISON:  08/01/2022 FINDINGS: Status post median sternotomy and CABG procedure. The pulmonary arterial catheter tip is in the proximal right pulmonary artery. There is been interval removal of the ET tube. Mediastinal drain and left chest tube are stable. No signs of pneumothorax. Lung volumes are low. There is decreased aeration to the left lung base compared with the previous exam which is favored to represent atelectasis. Small left effusion not excluded. No interstitial edema. IMPRESSION: 1. Interval removal of ET tube. 2. Stable left chest tube without pneumothorax. 3. Decreased aeration to the left lung base favored to represent atelectasis. Small left effusion not excluded. Electronically Signed   By: Kerby Moors M.D.   On: 08/02/2022 08:24   DG Chest Port 1 View  Result Date: 08/01/2022 CLINICAL DATA:  Status post CABG EXAM: PORTABLE CHEST 1 VIEW COMPARISON:  07/31/2022 FINDINGS: Status post interval median sternotomy and  CABG. Support apparatus includes endotracheal tube, esophagogastric tube, right neck pulmonary arterial catheter, tip projecting over the pulmonary outflow tract, and left chest and mediastinal drainage tubes. No significant pneumothorax or pleural effusion. Osseous structures unremarkable. IMPRESSION: 1. Status post interval median sternotomy and CABG. 2. Support apparatus as above. 3. No significant pneumothorax or pleural effusion. Electronically Signed   By: Delanna Ahmadi M.D.   On: 08/01/2022 13:40   ECHO INTRAOPERATIVE TEE  Result Date: 08/01/2022  *INTRAOPERATIVE TRANSESOPHAGEAL REPORT *  Patient Name:   Corey [name] [facility] Date of Exam: 08/01/2022 Medical Rec #:  [id number]       Height:       69.0 in Accession #:    [id number]      Weight:       160.0 lb Date of Birth:  02/01/1941       BSA:          1.88 m Patient Age:    81 years        BP:           114/61 mmHg Patient Gender: M               HR:           64 bpm. Exam Location:  Inpatient Transesophogeal exam was perform intraoperatively during surgical procedure. Patient was closely monitored under general anesthesia during the entirety of examination. Indications:     coronary artery bypass surgery Performing Phys: Ellsworth HENDRICKSON Diagnosing Phys: Myrtie Soman MD Complications: No known complications during this procedure. POST-OP IMPRESSIONS _ Left Ventricle: The left ventricle is unchanged from pre-bypass. _ Right Ventricle: The right ventricle appears unchanged from pre-bypass. _ Aorta: The aorta appears unchanged from pre-bypass. _ Left Atrium: The left atrium appears unchanged from pre-bypass. _ Left Atrial Appendage: The left atrial appendage appears unchanged from pre-bypass. _ Aortic Valve: The aortic valve appears unchanged from pre-bypass. _ Mitral Valve: The mitral valve appears unchanged from pre-bypass. _ Tricuspid Valve: The tricuspid valve appears unchanged from pre-bypass. _ Pulmonic Valve: The pulmonic valve appears unchanged  from pre-bypass. _ Interatrial Septum: The interatrial septum appears unchanged from pre-bypass. _ Interventricular Septum: The interventricular septum appears unchanged from pre-bypass. _ Pericardium: The pericardium appears unchanged from pre-bypass. _ Comments: Excellent LV function post bypass No RWMA. PRE-OP FINDINGS  Left Ventricle: The left ventricle has low normal systolic function, with an ejection fraction of 50-55%. The cavity size was normal. There is mildly increased left ventricular wall thickness. No evidence of left ventricular regional wall motion abnormalities.  There is mild concentric left ventricular hypertrophy. Right Ventricle: The right ventricle has normal systolic function. The cavity was normal. There is increased right ventricular wall thickness. Right ventricular systolic pressure is normal. Left Atrium: Left atrial size was normal in size. No left atrial/left atrial appendage thrombus was detected. The left atrial appendage is well visualized and there is no evidence of thrombus present. Right Atrium: Right atrial size was normal in size. Interatrial Septum: No atrial level shunt detected by color flow Doppler. Agitated saline contrast was given intravenously to evaluate for intracardiac shunting. There is no evidence of a patent foramen ovale. Pericardium: A small pericardial effusion is present. The pericardial effusion is anterior to the right ventricle. Mitral Valve: The mitral valve is normal in structure. Mitral valve regurgitation is trivial by color flow Doppler. There is No evidence of mitral stenosis. Tricuspid Valve: The tricuspid valve was normal in structure. Tricuspid valve regurgitation was not visualized by color flow Doppler. No evidence of tricuspid stenosis is present. Aortic Valve: The aortic valve is normal in structure. Aortic valve regurgitation was not visualized by color flow Doppler. There is no stenosis of the aortic valve. There is moderate thickening and mild  calcification present on the aortic valve right coronary cusp with normal mobility and there is moderate thickening and mild calcification present on the aortic valve non-coronary cusp with normal mobility. Pulmonic Valve: The pulmonic valve was normal in structure, with normal. No evidence of pumonic stenosis. Pulmonic valve regurgitation is not visualized by color flow Doppler. Aorta: The aortic root and ascending aorta are normal in size and structure. There is evidence of plaque in the descending aorta; Grade I, measuring 1-4m in size. Shunts: There is no evidence of an atrial septal defect.  GMyrtie SomanMD Electronically signed by GMyrtie SomanMD Signature Date/Time: 08/01/2022/1:03:25 PM    Final    VAS [place]oreaDOPPLER PRE CABG  Result Date: 07/31/2022 PREOPERATIVE VASCULAR EVALUATION [facility]am:   07/31/2022 Medical Rec #: [id number]       Accession #:    [id number] of Birth: 104/14/1942       Patient Gender: M Patient Age:   [age]years [facility]:      VAS [place]PLER PRE CABG Referring Phys: SRemo LippsHENDRICKSON --------------------------------------------------------------------------------  Indications:      Pre-CABG. Risk Factors:     Hypertension, hyperlipidemia, Diabetes. Comparison Study: no prior Performing Technologist: MArchie PattenRVS  Examination Guidelines: A complete evaluation includes B-mode imaging, spectral Doppler, color Doppler, and power Doppler as needed of all accessible portions of each vessel. Bilateral testing is considered an integral part of a complete examination. Limited examinations for reoccurring indications may be performed as noted.  Right Carotid Findings: +----------+--------+--------+--------+------------+--------+           PSV cm/sEDV cm/sStenosisDescribe    Comments +----------+--------+--------+--------+------------+--------+ CCA Prox  76      13              heterogenous          +----------+--------+--------+--------+------------+--------+ CCA Distal60      15              heterogenous         +----------+--------+--------+--------+------------+--------+ ICA Prox  59      17              heterogenous         +----------+--------+--------+--------+------------+--------+ ICA Distal49  9                                    +----------+--------+--------+--------+------------+--------+ ECA       72                                           +----------+--------+--------+--------+------------+--------+ +----------+--------+-------+--------+------------+           PSV cm/sEDV cmsDescribeArm Pressure +----------+--------+-------+--------+------------+ Subclavian88                                  +----------+--------+-------+--------+------------+ +---------+--------+--+--------+-+---------+ VertebralPSV cm/s25EDV cm/s8Antegrade +---------+--------+--+--------+-+---------+ Left Carotid Findings: +----------+--------+--------+--------+------------+--------+           PSV cm/sEDV cm/sStenosisDescribe    Comments +----------+--------+--------+--------+------------+--------+ CCA Prox  85      13              heterogenous         +----------+--------+--------+--------+------------+--------+ CCA Distal50      9               heterogenous         +----------+--------+--------+--------+------------+--------+ ICA Prox  51      18              heterogenous         +----------+--------+--------+--------+------------+--------+ ICA Distal56      23                                   +----------+--------+--------+--------+------------+--------+ ECA       72                                           +----------+--------+--------+--------+------------+--------+ +----------+--------+--------+--------+------------+ SubclavianPSV cm/sEDV cm/sDescribeArm Pressure +----------+--------+--------+--------+------------+            85                                   +----------+--------+--------+--------+------------+ +---------+--------+--+--------+--+---------+ VertebralPSV cm/s27EDV cm/s11Antegrade +---------+--------+--+--------+--+---------+  ABI Findings: +--------+------------------+-----+---------+--------+ Right   Rt Pressure (mmHg)IndexWaveform Comment  +--------+------------------+-----+---------+--------+ Brachial                       triphasic         +--------+------------------+-----+---------+--------+ ATA                            triphasic         +--------+------------------+-----+---------+--------+ PTA                            triphasic         +--------+------------------+-----+---------+--------+ +--------+------------------+-----+---------+-------+ Left    Lt Pressure (mmHg)IndexWaveform Comment +--------+------------------+-----+---------+-------+ Brachial                       triphasic        +--------+------------------+-----+---------+-------+ ATA  triphasic        +--------+------------------+-----+---------+-------+ PTA                            triphasic        +--------+------------------+-----+---------+-------+  Right Doppler Findings: +--------+--------+-----+---------+--------+ Site    PressureIndexDoppler  Comments +--------+--------+-----+---------+--------+ Brachial             triphasic         +--------+--------+-----+---------+--------+ Radial               triphasic         +--------+--------+-----+---------+--------+ Ulnar                triphasic         +--------+--------+-----+---------+--------+  Left Doppler Findings: +--------+--------+-----+---------+--------+ Site    PressureIndexDoppler  Comments +--------+--------+-----+---------+--------+ Brachial             triphasic         +--------+--------+-----+---------+--------+ Radial               triphasic          +--------+--------+-----+---------+--------+ Ulnar                triphasic         +--------+--------+-----+---------+--------+   Summary: Right Carotid: The extracranial vessels were near-normal with only minimal wall                thickening or plaque. Left Carotid: The extracranial vessels were near-normal with only minimal wall               thickening or plaque. Vertebrals: Bilateral vertebral arteries demonstrate antegrade flow. Right Upper Extremity: Doppler waveforms remain within normal limits with right radial compression. Doppler waveforms decrease 50% with right ulnar compression. Left Upper Extremity: Doppler waveforms decrease 50% with left radial compression. Doppler waveforms remain within normal limits with left ulnar compression.  Electronically signed by Deitra Mayo MD on 07/31/2022 at 6:04:51 PM.    Final    DG Chest Port 1 View  Result Date: 07/31/2022 CLINICAL DATA:  Chest pain EXAM: PORTABLE CHEST 1 VIEW COMPARISON:  09/06/2019 FINDINGS: The heart size and mediastinal contours are within normal limits. Both lungs are clear. The visualized skeletal structures are unremarkable. IMPRESSION: No active disease. Electronically Signed   By: Elmer Picker M.D.   On: 07/31/2022 16:13   ECHOCARDIOGRAM COMPLETE  Result Date: 07/31/2022    ECHOCARDIOGRAM REPORT   Patient Name:   Corey [name] Date of Exam: 07/31/2022 Medical Rec #:  [id number]       Height:       69.0 in Accession #:    [id number]      Weight:       160.0 lb Date of Birth:  Jul 23, 1941       BSA:          1.879 m Patient Age:    84 years        BP:           147/71 mmHg Patient Gender: M               HR:           54 bpm. 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  STEMI; Risk Factors:Hypertension and Dyslipidemia.   Sonographer:    Raquel Sarna Senior RDCS Referring Phys: [place]  1. Left ventricular ejection fraction, by estimation, is 40 to 45%. The left ventricle has mildly decreased function. The left ventricle demonstrates regional wall motion abnormalities (see scoring diagram/findings for description). Left ventricular diastolic parameters are consistent with Grade I diastolic dysfunction (impaired relaxation). There is moderate hypokinesis of the left ventricular, entire anterolateral wall and inferolateral wall.  2. Right ventricular systolic function is normal. The right ventricular size is normal. Tricuspid regurgitation signal is inadequate for assessing PA pressure.  3. Left atrial size was mildly dilated.  4. A small pericardial effusion is present. The pericardial effusion is anterior to the right ventricle.  5. The mitral valve is normal in structure. No evidence of mitral valve regurgitation.  6. The aortic valve is tricuspid. There is mild calcification of the aortic valve. There is mild thickening of the aortic valve. Aortic valve regurgitation is not visualized. Aortic valve sclerosis/calcification is present, without any evidence of aortic stenosis.  7. The inferior vena cava is normal in size with greater than 50% respiratory variability, suggesting right atrial pressure of 3 mmHg. FINDINGS  Left Ventricle: Left ventricular ejection fraction, by estimation, is 40 to 45%. The left ventricle has mildly decreased function. The left ventricle demonstrates regional wall motion abnormalities. Moderate hypokinesis of the left ventricular, entire anterolateral wall and inferolateral wall. The left ventricular internal cavity size was normal in size. There is no left ventricular hypertrophy. Left ventricular diastolic parameters are consistent with Grade I diastolic dysfunction (impaired relaxation). Normal left ventricular filling pressure. Right Ventricle: The right ventricular size is  normal. No increase in right ventricular wall thickness. Right ventricular systolic function is normal. Tricuspid regurgitation signal is inadequate for assessing PA pressure. Left Atrium: Left atrial size was mildly dilated. Right Atrium: Right atrial size was normal in size. Pericardium: A small pericardial effusion is present. The pericardial effusion is anterior to the right ventricle. Mitral Valve: The mitral valve is normal in structure. No evidence of mitral valve regurgitation. Tricuspid Valve: The tricuspid valve is normal in structure. Tricuspid valve regurgitation is not demonstrated. Aortic Valve: The aortic valve is tricuspid. There is mild calcification of the aortic valve. There is mild thickening of the aortic valve. Aortic valve regurgitation is not visualized. Aortic valve sclerosis/calcification is present, without any evidence of aortic stenosis. Pulmonic Valve: The pulmonic valve was not well visualized. Pulmonic valve regurgitation is not visualized. Aorta: The aortic root and ascending aorta are structurally normal, with no evidence of dilitation. Venous: The inferior vena cava is normal in size with greater than 50% respiratory variability, suggesting right atrial pressure of 3 mmHg. IAS/Shunts: No atrial level shunt detected by color flow Doppler.  LEFT VENTRICLE PLAX 2D LVIDd:         4.20 cm   Diastology LVIDs:         2.80 cm   LV e' medial:    6.85 cm/s LV PW:         0.80 cm   LV E/e' medial:  10.4 LV IVS:        0.70 cm   LV e' lateral:   6.58 cm/s LVOT diam:     2.10 cm   LV E/e' lateral: 10.8 LV SV:         69 LV SV Index:   36 LVOT Area:     3.46 cm  RIGHT VENTRICLE RV S  prime:     7.94 cm/s TAPSE (M-mode): 1.7 cm LEFT ATRIUM             Index        RIGHT ATRIUM           Index LA diam:        4.00 cm 2.13 cm/m   RA Area:     13.60 cm LA Vol (A2C):   37.6 ml 20.01 ml/m  RA Volume:   30.20 ml  16.07 ml/m LA Vol (A4C):   39.5 ml 21.02 ml/m LA Biplane Vol: 31.6 ml 16.82 ml/m   AORTIC VALVE LVOT Vmax:   94.00 cm/s LVOT Vmean:  60.800 cm/s LVOT VTI:    0.198 m  AORTA Ao Root diam: 3.70 cm Ao Asc diam:  3.50 cm MITRAL VALVE MV Area (PHT): 3.96 cm    SHUNTS MV Decel Time: 192 msec    Systemic VTI:  0.20 m MV E velocity: 70.90 cm/s  Systemic Diam: 2.10 cm MV A velocity: 95.15 cm/s MV E/A ratio:  0.75 Mihai Croitoru MD Electronically signed by Sanda Klein MD Signature Date/Time: 07/31/2022/4:01:37 PM    Final      Patient Profile     81 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 Management per Dr Roxan Hockey, appreciate his care.       For questions or updates, please contact [place] Please consult [email or web address] for contact info under        Signed, Sherren Mocha, MD  08/02/2022, 9:44 AM

## 2022-08-02 NOTE — Op Note (Signed)
NAME: ANYELO, MCCUE MEDICAL RECORD NO: [id number] ACCOUNT NO: [id number] DATE OF BIRTH: 07/25/1941 FACILITY: [facility] LOCATION: [facility] PHYSICIAN: Revonda Standard. 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  PROCEDURE:   Median sternotomy, extracorporeal circulation,  Coronary artery bypass grafting x 2  Left internal mammary artery to LAD,  Saphenous vein graft to ramus intermedius, Endoscopic vein harvest right thigh.  SURGEON:  Revonda Standard. Roxan Hockey, MD  ASSISTANT:  Wynelle Beckmann, PA  ANESTHESIA:  General.  FINDINGS:  Transesophageal echocardiography showed good left ventricular function with no significant valvular pathology both pre and post-bypass.  Good quality conduits.  Good quality targets. Ramus intermedius intramyocardial.  CLINICAL NOTE:  [name] is an 81 year old man who presented with unstable chest pain and was initially classified as a ST elevation MI.  He had resolution of pain before going to the catheterization lab. In the catheterization laboratory, he was  found to have left main disease and a tight ostial circumflex stenosis involving a large ramus intermedius branch that was not amenable to percutaneous intervention.  He was advised to undergo coronary artery bypass grafting for survival benefit and relief of  symptoms.  The indications, risks, benefits, and alternatives were discussed in detail with the patient.  He understood and accepted the risks and agreed to proceed.  OPERATIVE NOTE:  [name] was brought to the preoperative holding area on 08/01/2022.  Anesthesia placed a Swan-Ganz catheter and an arterial blood pressure monitoring line.  He was taken to the operating room and  anesthetized and intubated.  A Foley  catheter was placed.  Intravenous antibiotics were administered.  Transesophageal echocardiography was performed by Dr. Kalman Shan.  Please see his separately dictated note for full details of the procedure.  The chest, abdomen and legs were prepped and draped in the usual sterile fashion.  A timeout was performed.  An incision was made in the medial aspect of the right leg at the level of the knee.  The greater saphenous vein was harvested from the right thigh endoscopically.  Simultaneously, a median sternotomy was performed and the left  internal mammary artery was harvested using standard technique under direct vision.  2000 units of heparin was administered during the vessel harvest. Both the saphenous vein and mammary artery were good quality vessels.  After harvesting the conduits,  the remainder of the full heparin dose was given.  A sternal retractor was placed and was opened over time.  The pericardium was opened.  The ascending aorta was inspected.  It was free of significant atherosclerotic disease.  After confirming adequate  anticoagulation with ACT measurement, the aorta was cannulated via concentric 2-0 Ethibond pledgeted pursestring sutures.  A dual stage venous cannula was placed via a pursestring suture in right atrial appendage.  Cardiopulmonary bypass was initiated.   Flows were maintained per protocol.  The patient was cooled to 34 degrees Celsius.  The coronary arteries were inspected and anastomotic sites were chosen.  Of note, the ramus intermedius was an intramyocardial vessel.  A foam pad was placed in the  pericardium to insulate the heart.  A temperature probe was placed in the myocardial septum and a cardioplegia cannula was placed in the ascending aorta.  The aorta was crossclamped.  The left ventricle was emptied via the aortic  root vent.  Cardiac arrest then was achieved with a combination of cold antegrade blood cardioplegia and topical ice  saline.  There was a rapid diastolic arrest and septal cooling to 10 degree Celsius with 750 mL of cardioplegia.  A reversed saphenous vein graft then was placed end-to-side to the ramus intermedius.  The ramus was a 1.5 mm good quality intramyocardial target.  The vein was of good quality.  It was anastomosed end-to-side with a running 7-0 Prolene suture.  At the completion of the anastomosis cardioplegia was administered down the graft and there was good flow.  There was no bleeding from the anastomosis, but there was bleeding from the myocardium where it had been dissected to expose the vessel.  This required suturing at several  sites.  Additional cardioplegia was also administered via the aortic root.  The left internal mammary artery was brought through a window in the pericardium.  The distal end was bevelled.  It was anastomosed end-to-side to the LAD.  Both the mammary and LAD were 2 mm good quality vessels and the end-to-side anastomosis was  performed with a running 8-0 Prolene suture.  At the completion of the anastomosis, the bulldog clamp was briefly removed.  Rapid septal rewarming was noted.  The bulldog clamp was replaced and the mammary pedicle was tacked to the epicardial surface of the heart  with 6-0 Prolene sutures.  Additional cardioplegia was administered.  The vein graft was cut to length.  The cardioplegia cannula was removed from the ascending aorta and the proximal vein graft anastomosis was performed to 4.5 mm punch aortotomy with a running 6-0 Prolene suture.   At the completion of the final proximal anastomosis, the patient was placed in Trendelenburg position.  Lidocaine was administered.  The aortic root was de-aired and the aortic crossclamp was removed.  Total crossclamp time was 45 minutes.  The patient  required a single defibrillation with 10 joules and then was in sinus rhythm thereafter.  While rewarming was completed, all proximal and distal anastomoses were  inspected for hemostasis.  Epicardial pacing wires were placed on the right ventricle and right atrium and DDD pacing was initiated at 80 beats per minute.  The patient then weaned  from cardiopulmonary bypass on the first attempt without inotropic support.  Total bypass time was 69 minutes.  Initial cardiac index was greater than 2 liters per minute per meter squared and the patient remained hemodynamically stable throughout the  post-bypass period.  A test dose of protamine was administered and was well tolerated.  The atrial and aortic cannulae were removed.  The remainder of the protamine was administered without incident.  The chest was irrigated with warm saline.  Hemostasis was achieved.  The  pericardium was reapproximated over the ascending aorta with interrupted 3-0 silk sutures.  Left pleural and mediastinal chest tubes were placed through separate subcostal incisions.  The sternum was closed with a combination of single and double heavy  gauge stainless steel wires.  Pectoralis fascia, subcutaneous tissue and skin were closed in standard fashion.  All sponge, needle and instrument counts were correct at the end of the procedure.  The patient was transported from the operating room to the  surgical intensive care unit, intubated and in good condition.  Experienced assistance was necessary for this case due to surgical complexity.  Bailey Stehler harvested the saphenous vein while I harvested the mammary artery and then she closed the leg incision, also provided exposure, retraction, suture management  and suctioning during the anastomoses.   VAI D: 08/01/2022 2:54:16 pm T: 08/02/2022 1:06:00 am  JOB: [id number]/ [id number]

## 2022-08-02 NOTE — Progress Notes (Signed)
Heart Failure Navigator Progress Note  Following this hospitalization to assess for HV TOC readiness.   EF 40-45%  CABG 08/01/22  Earnestine Leys, BSN, RN Heart Failure Transport planner Only

## 2022-08-02 NOTE — Progress Notes (Signed)
      [address]       [place] [address]             [phone]      Sleeping currently  BP (!) 127/107   Pulse 87   Temp 98.6 F (37 C) (Oral)   Resp 19   Ht '5\' 9"'$  (1.753 m)   Wt 76.4 kg   SpO2 92%   BMI 24.87 kg/m  Off neo  Intake/Output Summary (Last 24 hours) at 08/02/2022 1759 Last data filed at 08/02/2022 1600 Gross per 24 hour  Intake 2024.33 ml  Output 1521 ml  Net 503.33 ml   CBG well controlled PM labs pending  Remo Lipps C. Roxan Hockey, MD [facility] [phone]

## 2022-08-03 ENCOUNTER — Other Ambulatory Visit (HOSPITAL_COMMUNITY): Payer: Self-pay

## 2022-08-03 ENCOUNTER — Inpatient Hospital Stay (HOSPITAL_COMMUNITY): Payer: Medicare Other

## 2022-08-03 DIAGNOSIS — I2121 ST elevation (STEMI) myocardial infarction involving left circumflex coronary artery: Secondary | ICD-10-CM | POA: Diagnosis not present

## 2022-08-03 LAB — GLUCOSE, CAPILLARY
Glucose-Capillary: 105 mg/dL — ABNORMAL HIGH (ref 70–99)
Glucose-Capillary: 92 mg/dL (ref 70–99)
Glucose-Capillary: 95 mg/dL (ref 70–99)
Glucose-Capillary: 99 mg/dL (ref 70–99)

## 2022-08-03 LAB — BASIC METABOLIC PANEL
Anion gap: 10 (ref 5–15)
BUN: 14 mg/dL (ref 8–23)
CO2: 23 mmol/L (ref 22–32)
Calcium: 7.5 mg/dL — ABNORMAL LOW (ref 8.9–10.3)
Chloride: 102 mmol/L (ref 98–111)
Creatinine, Ser: 1.07 mg/dL (ref 0.61–1.24)
GFR, Estimated: >60 mL/min (ref >60)
Glucose, Bld: 91 mg/dL (ref 70–99)
Potassium: 3.9 mmol/L (ref 3.5–5.1)
Sodium: 135 mmol/L (ref 135–145)

## 2022-08-03 LAB — CBC
HCT: 33.8 % — ABNORMAL LOW (ref 39.0–52.0)
Hemoglobin: 11.4 g/dL — ABNORMAL LOW (ref 13.0–17.0)
MCH: 32.1 pg (ref 26.0–34.0)
MCHC: 33.7 g/dL (ref 30.0–36.0)
MCV: 95.2 fL (ref 80.0–100.0)
Platelets: 101 10*3/uL — ABNORMAL LOW (ref 150–400)
RBC: 3.55 MIL/uL — ABNORMAL LOW (ref 4.22–5.81)
RDW: 13.9 % (ref 11.5–15.5)
WBC: 30.8 10*3/uL — ABNORMAL HIGH (ref 4.0–10.5)
nRBC: 0 % (ref 0.0–0.2)

## 2022-08-03 MED ORDER — POTASSIUM CHLORIDE CRYS ER 10 MEQ PO TBCR
20.0000 meq | EXTENDED_RELEASE_TABLET | Freq: Every day | ORAL | Status: DC
  Administered 2022-08-03 – 2022-08-04 (×2): 20 meq via ORAL
  Filled 2022-08-03 (×2): qty 2

## 2022-08-03 MED ORDER — FUROSEMIDE 40 MG PO TABS
40.0000 mg | ORAL_TABLET | Freq: Every day | ORAL | Status: DC
  Administered 2022-08-03 – 2022-08-04 (×2): 40 mg via ORAL
  Filled 2022-08-03 (×2): qty 1

## 2022-08-03 MED ORDER — ALUM & MAG HYDROXIDE-SIMETH 200-200-20 MG/5ML PO SUSP: 15.0000 mL | Freq: Four times a day (QID) | ORAL | Status: DC | PRN

## 2022-08-03 MED ORDER — MAGNESIUM HYDROXIDE 400 MG/5ML PO SUSP: 30.0000 mL | Freq: Every day | ORAL | Status: DC | PRN

## 2022-08-03 MED ORDER — SODIUM CHLORIDE 0.9 % IV SOLN: 250.0000 mL | INTRAVENOUS | Status: DC | PRN

## 2022-08-03 MED ORDER — SODIUM CHLORIDE 0.9% FLUSH: 3.0000 mL | INTRAVENOUS | Status: DC | PRN

## 2022-08-03 MED ORDER — ORAL CARE MOUTH RINSE: 15.0000 mL | OROMUCOSAL | Status: DC | PRN

## 2022-08-03 MED ORDER — SODIUM CHLORIDE 0.9% FLUSH
3.0000 mL | Freq: Two times a day (BID) | INTRAVENOUS | Status: DC
  Administered 2022-08-03 – 2022-08-05 (×4): 3 mL via INTRAVENOUS

## 2022-08-03 MED ORDER — ~~LOC~~ CARDIAC SURGERY, PATIENT & FAMILY EDUCATION: Freq: Once | Status: AC

## 2022-08-03 NOTE — Progress Notes (Addendum)
   Heart Failure Stewardship Pharmacist Progress Note   PCP: Christain [place], MD PCP-Cardiologist: None    HPI:  81 y.o. male with PMH significant for HTN, HLD, gout, BPH, and diverticulitis. He presented to the ED on 11/7 with reported midsternal pressure that radiated to his bilateral armpits while taking his dog out that morning. He reported lightheadedness at baseline, but more than normal. A code STEMI was called. CABG x2 was performed on 11/8.  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 ECHO on 11/7 showed 40-45% LVEF and grade I diastolic dysfunction and moderate hypokinesis of the LV, anterolateral wall, and inferolateral wall. Additionally, a small pericardial effusion was present.   Current HF Medications: Diuretic: furosemide '40mg'$  once daily Beta Blocker: metoprolol tartrate 12.'5mg'$  BID  Prior to admission HF Medications: None  Pertinent Lab Values: Serum creatinine 1.07, BUN 14, Potassium 3.9, Sodium 135, Magnesium 2.2, A1c 5.7  Vital Signs: Weight: 168 lbs (admission weight: 160 lbs) Blood pressure: 90-110/60-90s Heart rate: 70-90  I/O: +41.7L yesterday; net +1.011L  Medication Assistance / Insurance Benefits Check: Does the patient have prescription insurance?  Yes Type of insurance plan: AARP  Does the patient qualify for medication assistance through manufacturers or grants?   Pending Eligible grants and/or patient assistance programs: pending Medication assistance applications in progress: none  Medication assistance applications approved: none Approved medication assistance renewals will be completed by: pending  Outpatient Pharmacy:  Prior to admission outpatient pharmacy: [facility] Is the patient willing to use [facility] at discharge? Yes Is the patient willing to transition their outpatient pharmacy to utilize a [facility] outpatient pharmacy?   Pending    Assessment: 1. 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  NYHA class II-III symptoms. - Keep K>4 and Mg>2. Strict I/Os. Daily standing weights.  - Would benefit from HF GDMT SGLT2i therapy at this time. - Currently euvolemic.May want to monitor volume status to determine if Lasix dose needs to be adjusted with possible SGLT2i initiation. - Transition to metoprolol succinate prior to discharge. - Would benefit from RAAS and spironolactone therapy later during his hospitalization if BP allows.      Plan: 1) Medication changes recommended at this time: -Initiate Farxiga '10mg'$  once daily  2) Patient assistance: -Copays for Lovie Macadamia, and Entresto- $47  3)  Education  - Initial education provided -Full education to be provided prior to discharge.  Maryan Puls, PharmD PGY-1 [facility] Pharmacy Resident

## 2022-08-03 NOTE — Progress Notes (Signed)
2 Days Post-Op Procedure(s) (LRB): 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 USING LEFT INTERNAL MAMMARY AND RIGHT SAPHENOUS LEG VEIN HARVESTED ENDOSCOPICALLY (N/A) TRANSESOPHAGEAL ECHOCARDIOGRAM (TEE) (N/A) Subjective: No complaints this AM  Objective: Vital signs in last 24 hours: Temp:  [98 F (36.7 C)-99 F (37.2 C)] 98.8 F (37.1 C) (11/10 0300) Pulse Rate:  [83-90] 88 (11/10 0700) Cardiac Rhythm: Atrial paced (11/10 0400) Resp:  [7-27] 11 (11/10 0700) BP: (93-131)/(58-107) 109/61 (11/10 0700) SpO2:  [90 %-96 %] 91 % (11/10 0700) Arterial Line BP: (92-130)/(43-57) 130/55 (11/10 0400) Weight:  [76.4 kg] 76.4 kg (11/10 0500)  Hemodynamic parameters for last 24 hours: PAP: (19-23)/(14-18) 19/14  Intake/Output from previous day: 11/09 0701 - 11/10 0700 In: 836.5 [I.V.:536.5; IV Piggyback:300] Out: 1405 [Urine:1365; Chest Tube:40] Intake/Output this shift: No intake/output data recorded.  General appearance: alert, cooperative, and no distress Neurologic: intact Heart: regular rate and rhythm Lungs: diminished breath sounds bibasilar Abdomen: normal findings: soft, non-tender  Lab Results: Recent Labs    08/02/22 1813 08/03/22 0412  WBC 31.2* 30.8*  HGB 11.1* 11.4*  HCT 34.4* 33.8*  PLT 104* 101*   BMET:  Recent Labs    08/02/22 1813 08/03/22 0412  NA 133* 135  K 4.1 3.9  CL 102 102  CO2 23 23  GLUCOSE 109* 91  BUN 11 14  CREATININE 1.11 1.07  CALCIUM 7.6* 7.5*    PT/INR:  Recent Labs    08/01/22 1310  LABPROT 17.5*  INR 1.5*   ABG    Component Value Date/Time   PHART 7.320 (L) 08/01/2022 1801   HCO3 19.7 (L) 08/01/2022 1801   TCO2 21 (L) 08/01/2022 1801   ACIDBASEDEF 6.0 (H) 08/01/2022 1801   O2SAT 92 08/01/2022 1801   CBG (last 3)  Recent Labs    08/03/22 0040 08/03/22 0341 08/03/22 0726  GLUCAP 105* 92 99    Assessment/Plan: S/P Procedure(s) (LRB): 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 USING LEFT INTERNAL  MAMMARY AND RIGHT SAPHENOUS LEG VEIN HARVESTED ENDOSCOPICALLY (N/A) TRANSESOPHAGEAL ECHOCARDIOGRAM (TEE) (N/A) Plan for transfer to step-down: see transfer orders POD # 2, looks good NEURO- intact CV- in SR with rare PACs  ASA, statin, beta blocker RESP- IS for basilar atelectasis RENAL- creatinine and lytes OK  PO Lasix for fluid overload ENDO- CBG normal- dc SSI GI- advance diet Leukocytosis- CLL, 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

## 2022-08-03 NOTE — Progress Notes (Signed)
CARDIAC REHAB PHASE I   PRE:  Rate/Rhythm: 55 SR with PACs    BP: sitting 108/66    SaO2: 93 3L  MODE:  Ambulation: 370 ft   POST:  Rate/Rhythm: 76 SR    BP: sitting 102/72     SaO2: 95 3L  Pt moved out of bed with mod assist, reminders for sternal precautions, some impulsivity. Stood independently and walked with eva. Slow and steady, used 3L. No major c/o. To recliner. Practiced IS, 900 ml. Encouraged x3 walks a day. [place], ACSM-CEP 08/03/2022 1:57 PM

## 2022-08-03 NOTE — Progress Notes (Signed)
Rounding Note    Patient Name: Corey [name] Date of Encounter: 08/03/2022  [place] Cardiologist: None   Subjective   Doing well this morning.  No chest pain or dyspnea.  Inpatient Medications    Scheduled Meds:  acetaminophen  1,000 mg Oral Q6H   Or   acetaminophen (TYLENOL) oral liquid 160 mg/5 mL  1,000 mg Per Tube Q6H   aspirin EC  325 mg Oral Daily   Or   aspirin  324 mg Per Tube Daily   atorvastatin  80 mg Oral Daily   bisacodyl  10 mg Oral Daily   Or   bisacodyl  10 mg Rectal Daily   Chlorhexidine Gluconate Cloth  6 each Topical Daily   docusate sodium  200 mg Oral Daily   enoxaparin (LOVENOX) injection  40 mg Subcutaneous QHS   finasteride  5 mg Oral Daily   furosemide  40 mg Oral Daily   metoprolol tartrate  12.5 mg Oral BID   mupirocin ointment  1 Application Nasal BID   pantoprazole  40 mg Oral Daily   potassium chloride  20 mEq Oral Daily   sodium chloride flush  3 mL Intravenous Q12H   tamsulosin  0.4 mg Oral QPC supper   Continuous Infusions:  sodium chloride Stopped (08/02/22 1542)   sodium chloride     sodium chloride 20 mL/hr at 08/03/22 0600   lactated ringers     lactated ringers Stopped (08/01/22 1342)   lactated ringers 20 mL/hr at 08/03/22 0600   PRN Meds: sodium chloride, guaiFENesin, lactated ringers, metoprolol tartrate, midazolam, morphine injection, ondansetron (ZOFRAN) IV, mouth rinse, oxyCODONE, sodium chloride flush, traMADol   Vital Signs    Vitals:   08/03/22 0500 08/03/22 0600 08/03/22 0700 08/03/22 0734  BP: 108/67 108/60 109/61   Pulse: 90 88 88   Resp: '14 20 11   '$ Temp:    98.7 F (37.1 C)  TempSrc:    Oral  SpO2: 95% 94% 91%   Weight: 76.4 kg     Height:        Intake/Output Summary (Last 24 hours) at 08/03/2022 0917 Last data filed at 08/03/2022 0600 Gross per 24 hour  Intake 836.52 ml  Output 1405 ml  Net -568.48 ml      08/03/2022    5:00 AM 08/02/2022    5:00 AM 07/31/2022    8:07 AM   Last 3 Weights  Weight (lbs) 168 lb 6.9 oz 168 lb 6.9 oz 160 lb  Weight (kg) 76.4 kg 76.4 kg 72.576 kg      Telemetry    Sinus rhythm without significant arrhythmia - Personally Reviewed   Physical Exam  Alert, oriented, no distress GEN: No acute distress.   Neck: No JVD Cardiac: RRR, no murmurs, rubs, or gallops.  Respiratory: Clear to auscultation bilaterally. GI: Soft, nontender, non-distended  MS: No edema; No deformity. Neuro:  Nonfocal  Psych: Normal affect   Labs    High Sensitivity Troponin:   Recent Labs  Lab 07/31/22 0759  TROPONINIHS 80*     Chemistry Recent Labs  Lab 07/31/22 0759 08/01/22 0603 08/02/22 0405 08/02/22 1813 08/03/22 0412  NA 140   < > 134* 133* 135  K 4.9   < > 4.4 4.1 3.9  CL 106   < > 106 102 102  CO2 24   < > '24 23 23  '$ GLUCOSE 125*   < > 117* 109* 91  BUN 9   < >  $'9 11 14  'M$ CREATININE 1.04   < > 1.02 1.11 1.07  CALCIUM 8.8*   < > 7.2* 7.6* 7.5*  MG  --   --  2.3 2.2  --   PROT 5.8*  --   --   --   --   ALBUMIN 3.4*  --   --   --   --   AST 29  --   --   --   --   ALT 22  --   --   --   --   ALKPHOS 47  --   --   --   --   BILITOT 1.2  --   --   --   --   GFRNONAA >60   < > >60 >60 >60  ANIONGAP 10   < > 4* 8 10   < > = values in this interval not displayed.    Lipids  Recent Labs  Lab 07/31/22 0759  CHOL 166  TRIG 109  HDL 45  LDLCALC 99  CHOLHDL 3.7    Hematology Recent Labs  Lab 08/02/22 0405 08/02/22 1813 08/03/22 0412  WBC 41.1* 31.2* 30.8*  RBC 3.69* 3.54* 3.55*  HGB 12.2* 11.1* 11.4*  HCT 35.5* 34.4* 33.8*  MCV 96.2 97.2 95.2  MCH 33.1 31.4 32.1  MCHC 34.4 32.3 33.7  RDW 13.6 13.8 13.9  PLT 122* 104* 101*   Thyroid No results for input(s): "TSH", "FREET4" in the last 168 hours.  BNPNo results for input(s): "BNP", "PROBNP" in the last 168 hours.  DDimer No results for input(s): "DDIMER" in the last 168 hours.   Radiology    DG Chest Port 1 View  Result Date: 08/03/2022 CLINICAL DATA:   81 year old male status post CABG postoperative day 2. EXAM: PORTABLE CHEST 1 VIEW COMPARISON:  Portable chest 08/02/2022 and earlier. FINDINGS: Portable AP semi upright view at 0535 hours. Swan-Ganz catheter removed, right IJ introducer sheath remains. Chest and mediastinal tubes removed. Epicardial pacer wires remain. Larger lung volumes. No pneumothorax identified. Pulmonary vascularity is at the upper limits of normal. Residual medial lung base atelectasis and small if any pleural fluid volume. Negative visible bowel gas.  Stable visualized osseous structures. IMPRESSION: 1. Chest tubes and Swan-Ganz catheter removed. No pneumothorax identified. 2. Improved lung volumes with residual atelectasis and small pleural effusion(s). Electronically Signed   By: Genevie Ann M.D.   On: 08/03/2022 06:32   DG Chest Port 1 View  Result Date: 08/02/2022 CLINICAL DATA:  Status post CABG. EXAM: PORTABLE CHEST 1 VIEW COMPARISON:  08/01/2022 FINDINGS: Status post median sternotomy and CABG procedure. The pulmonary arterial catheter tip is in the proximal right pulmonary artery. There is been interval removal of the ET tube. Mediastinal drain and left chest tube are stable. No signs of pneumothorax. Lung volumes are low. There is decreased aeration to the left lung base compared with the previous exam which is favored to represent atelectasis. Small left effusion not excluded. No interstitial edema. IMPRESSION: 1. Interval removal of ET tube. 2. Stable left chest tube without pneumothorax. 3. Decreased aeration to the left lung base favored to represent atelectasis. Small left effusion not excluded. Electronically Signed   By: Kerby Moors M.D.   On: 08/02/2022 08:24   DG Chest Port 1 View  Result Date: 08/01/2022 CLINICAL DATA:  Status post CABG EXAM: PORTABLE CHEST 1 VIEW COMPARISON:  07/31/2022 FINDINGS: Status post interval median sternotomy and CABG. Support apparatus includes endotracheal tube, esophagogastric tube,  right neck pulmonary arterial catheter, tip projecting over the pulmonary outflow tract, and left chest and mediastinal drainage tubes. No significant pneumothorax or pleural effusion. Osseous structures unremarkable. IMPRESSION: 1. Status post interval median sternotomy and CABG. 2. Support apparatus as above. 3. No significant pneumothorax or pleural effusion. Electronically Signed   By: Delanna Ahmadi M.D.   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  Transfer orders written by surgical team, patient on aspirin, high intensity statin, and beta-blocker.  When he is ready from postop perspective, he should be started on clopidogrel 75 mg daily.  Otherwise no changes recommended.      For questions or updates, please contact [place] Please consult [email or web address] for contact info under        Signed, Sherren Mocha, MD  08/03/2022, 9:17 AM

## 2022-08-03 NOTE — TOC Initial Note (Signed)
Transition of Care [facility]) - Initial/Assessment Note    Patient Details  Name: Corey [name] MRN: [id number] Date of Birth: 1941-05-27  Transition of Care [facility]) CM/SW Contact:    Bethena Roys, RN Phone Number: 08/03/2022, 2:55 PM  Clinical Narrative: Patient presented for chest pain-POD-2 CABG. PTA patient was independent from home alone. Patient reports that he was driving to appointments and getting his medications without any issues. Patient states he is without family support in [place]. However, patient states he has a sister that lives in [place] and she is willing to visit when needed. PT/OT has been ordered for recommendations. Patient states he will call his sister to discuss disposition needs as he progresses. Patient does not have any DME in the home. Case Manager will continue to follow for transition of care needs.              Expected Discharge Plan: [place] Barriers to Discharge: Continued Medical Work up   Patient Goals and CMS Choice Patient states their goals for this hospitalization and ongoing recovery are:: Patient wants to get back to baseline-being independent      Expected Discharge Plan and Services Expected Discharge Plan: [place] In-house Referral: NA Discharge Planning Services: CM Consult   Living arrangements for the past 2 months: Single Family Home  Prior Living Arrangements/Services Living arrangements for the past 2 months: Single Family Home Lives with:: Self Patient language and need for interpreter reviewed:: Yes        Need for Family Participation in Patient Care: Yes (Comment) Care giver support system in place?: Yes (comment)   Criminal Activity/Legal Involvement Pertinent to Current Situation/Hospitalization: No - Comment as needed   Permission Sought/Granted Permission sought to share information with : Case Manager, Family Supports (Patient reports he has a sister in  [place].)   Emotional Assessment Appearance:: Appears stated age Attitude/Demeanor/Rapport: Engaged Affect (typically observed): Appropriate Orientation: : Oriented to Situation, Oriented to  Time, Oriented to Place, Oriented to Self Alcohol / Substance Use: Not Applicable Psych Involvement: No (comment)  Admission diagnosis:  Acute ST elevation myocardial infarction (STEMI), unspecified artery ([place]) [I21.3] STEMI involving left circumflex coronary artery ([place]) [I21.21] Patient Active Problem List   Diagnosis Date Noted   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 [place] [id number]: [phone]: [phone] Readmission Risk Interventions     No data to display

## 2022-08-03 NOTE — Evaluation (Signed)
Occupational Therapy Evaluation Patient Details Name: Corey [name] MRN: [id number] DOB: [birth year] Today's Date: 08/03/2022   History of Present Illness 81yo male admitted 11/7 with STEMI. 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 PMhx: CLL, HTN, HLD, gout   Clinical Impression   This 81 yo male admitted and underwent above presents to acute OT with PLOF of living alone and doing his own basic ADLs, IADLs, and driving. He currently is setup/S-min guard A with basic ADLs and min guard A without AD to ambulate in his room. He will continue to benefit from acute OT with follow up [place] with 24/7 S/A recommended a minimum of 3-5 days post D/C.      Recommendations for follow up therapy are one component of a multi-disciplinary discharge planning process, led by the attending physician.  Recommendations may be updated based on patient status, additional functional criteria and insurance authorization.   Follow Up Recommendations  Home health OT    Assistance Recommended at Discharge Frequent or constant Supervision/Assistance  Patient can return home with the following A little help with walking and/or transfers;A little help with bathing/dressing/bathroom;Assistance with cooking/housework;Help with stairs or ramp for entrance;Assist for transportation    Functional Status Assessment  Patient has had a recent decline in their functional status and demonstrates the ability to make significant improvements in function in a reasonable and predictable amount of time.  Equipment Recommendations  None recommended by OT       Precautions / Restrictions Precautions Precautions: Sternal Precaution Booklet Issued: No Precaution Comments: external pacer      Mobility Bed Mobility Overal bed mobility: Needs Assistance Bed Mobility: Rolling, Sidelying to Sit Rolling: Min guard Sidelying to sit: Min guard       General bed mobility comments: VCs for technique    Transfers Overall transfer level: Needs  assistance Equipment used: None Transfers: Sit to/from Stand Sit to Stand: Min guard                  Balance Overall balance assessment: Mild deficits observed, not formally tested                                         ADL either performed or assessed with clinical judgement   ADL Overall ADL's : Needs assistance/impaired Eating/Feeding: Independent;Sitting   Grooming: Min guard;Standing   Upper Body Bathing: Set up;Sitting   Lower Body Bathing: Min guard;Sit to/from stand   Upper Body Dressing : Set up;Sitting   Lower Body Dressing: Min guard;Sit to/from stand   Toilet Transfer: Min guard;Ambulation;Regular Glass blower/designer Details (indicate cue type and reason): No AD Toileting- Clothing Manipulation and Hygiene: Min guard;Sit to/from stand               Vision Patient Visual Report: No change from baseline              Pertinent Vitals/Pain Pain Assessment Pain Assessment: No/denies pain     Hand Dominance Right   Extremity/Trunk Assessment Upper Extremity Assessment Upper Extremity Assessment: Overall WFL for tasks assessed           Communication Communication Communication: No difficulties   Cognition Arousal/Alertness: Awake/alert Behavior During Therapy: WFL for tasks assessed/performed Overall Cognitive Status: Within Functional Limits for tasks assessed  Home Living Family/patient expects to be discharged to:: Private residence Living Arrangements: Alone Available Help at Discharge: Family Type of Home: House Home Access: Stairs to enter Technical brewer of Steps: 3 Entrance Stairs-Rails: Left;Right;Can reach both [place]: One level     Bathroom Shower/Tub: Teacher, early years/pre: [place]: None   Additional Comments: Going to call sister to see if she can come up and stay with him OR  have neighbors check in on him      Prior Functioning/Environment Prior Level of Function : Independent/Modified Independent;Driving                        OT Problem List: Impaired balance (sitting and/or standing)      OT Treatment/Interventions: Self-care/ADL training;DME and/or AE instruction;Balance training;Patient/family education    OT Goals(Current goals can be found in the care plan section) Acute Rehab OT Goals Patient Stated Goal: to go home OT Goal Formulation: With patient Time For Goal Achievement: 08/17/22 Potential to Achieve Goals: Good  OT Frequency: Min 2X/week       AM-PAC OT "6 Clicks" Daily Activity     Outcome Measure Help from another person eating meals?: None Help from another person taking care of personal grooming?: A Little Help from another person toileting, which includes using toliet, bedpan, or urinal?: A Little Help from another person bathing (including washing, rinsing, drying)?: A Little Help from another person to put on and taking off regular upper body clothing?: A Little Help from another person to put on and taking off regular lower body clothing?: A Little 6 Click Score: 19   End of Session    Activity Tolerance: Patient tolerated treatment well Patient left: in bed;with call bell/phone within reach;with bed alarm set  OT Visit Diagnosis: Unsteadiness on feet (R26.81);Muscle weakness (generalized) (M62.81)                Time: 6579-0383 OT Time Calculation (min): 15 min Charges:  OT General Charges $OT Visit: 1 Visit OT Evaluation $OT Eval Moderate Complexity: [place], OTR/L Acute [employer] [facility] [phone] Office [phone]    Almon Register 08/03/2022, 4:52 PM

## 2022-08-04 ENCOUNTER — Inpatient Hospital Stay (HOSPITAL_COMMUNITY): Payer: Medicare Other

## 2022-08-04 LAB — CBC
HCT: 31.9 % — ABNORMAL LOW (ref 39.0–52.0)
Hemoglobin: 10.8 g/dL — ABNORMAL LOW (ref 13.0–17.0)
MCH: 32.2 pg (ref 26.0–34.0)
MCHC: 33.9 g/dL (ref 30.0–36.0)
MCV: 95.2 fL (ref 80.0–100.0)
Platelets: 103 10*3/uL — ABNORMAL LOW (ref 150–400)
RBC: 3.35 MIL/uL — ABNORMAL LOW (ref 4.22–5.81)
RDW: 14 % (ref 11.5–15.5)
WBC: 27.4 10*3/uL — ABNORMAL HIGH (ref 4.0–10.5)
nRBC: 0 % (ref 0.0–0.2)

## 2022-08-04 LAB — BASIC METABOLIC PANEL
Anion gap: 5 (ref 5–15)
BUN: 16 mg/dL (ref 8–23)
CO2: 23 mmol/L (ref 22–32)
Calcium: 7.5 mg/dL — ABNORMAL LOW (ref 8.9–10.3)
Chloride: 106 mmol/L (ref 98–111)
Creatinine, Ser: 1.2 mg/dL (ref 0.61–1.24)
GFR, Estimated: >60 mL/min (ref >60)
Glucose, Bld: 102 mg/dL — ABNORMAL HIGH (ref 70–99)
Potassium: 4.1 mmol/L (ref 3.5–5.1)
Sodium: 134 mmol/L — ABNORMAL LOW (ref 135–145)

## 2022-08-04 LAB — GLUCOSE, CAPILLARY
Glucose-Capillary: 115 mg/dL — ABNORMAL HIGH (ref 70–99)
Glucose-Capillary: 137 mg/dL — ABNORMAL HIGH (ref 70–99)

## 2022-08-04 MED ORDER — HALOPERIDOL LACTATE 5 MG/ML IJ SOLN
INTRAMUSCULAR | Status: AC
  Filled 2022-08-04: qty 1

## 2022-08-04 NOTE — Progress Notes (Signed)
CARDIAC REHAB PHASE I   PRE:  Rate/Rhythm: 83 SR  BP:  Sitting: 125/59      SaO2: 98 SR   MODE:  Ambulation: 470 ft   POST:  Rate/Rhythm: 92 SR   BP:  Sitting: 136/69      SaO2: 96 RA   Pt ambulated in hall independently using front wheel walker. Tolerated well with no pain or sob. Returned to room with call bell and bedside table in reach. Post OHS education including sternal precautions, move in the tub, home needs at discharge, site care,  heart healthy diet, IS use at home and CRP2 reviewed. All questions and concerns addressed. Pt states he has several friends and neighbors that can help him at home. Has DME needed for home. Will refer to [facility] for CRP2. Pt not sure if he is interested, would like to think about it.  Will continue to follow.    [id number]  Vanessa Barbara, RN BSN 08/04/2022 11:28 AM

## 2022-08-04 NOTE — Progress Notes (Signed)
Occupational Therapy Treatment Patient Details Name: Corey [name] MRN: [id number] DOB: Dec 21, 1940 Today's Date: 08/04/2022   History of present illness 81yo male admitted to [facility] on 07/31/22 with chest pain, workup for STEMI. Cardiac cath 07/31/22. 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 PMhx: CLL, HTN, HLD, gout   OT comments  Patient received in supine and was agreeable to OT session. COTA explained to patient that OT had self care goals and would like to address them. Patient stated he would walk and that was it, he could do self care and did not need to address. Patient did perform dressing with donning gown and performed mobility in room and hallway without an assistive device and supervision for safety. Patient returned to bed following session. Patient to continue to be followed by acute OT.    Recommendations for follow up therapy are one component of a multi-disciplinary discharge planning process, led by the attending physician.  Recommendations may be updated based on patient status, additional functional criteria and insurance authorization.    Follow Up Recommendations  Home health OT    Assistance Recommended at Discharge Frequent or constant Supervision/Assistance  Patient can return home with the following  A little help with walking and/or transfers;A little help with bathing/dressing/bathroom;Assistance with cooking/housework;Help with stairs or ramp for entrance;Assist for transportation   Equipment Recommendations  None recommended by OT    Recommendations for Other Services      Precautions / Restrictions Precautions Precautions: Sternal Precaution Booklet Issued: No Precaution Comments: external pacer Restrictions Weight Bearing Restrictions: No       Mobility Bed Mobility Overal bed mobility: Needs Assistance Bed Mobility: Rolling, Sidelying to Sit, Sit to Supine Rolling: Modified independent (Device/Increase time) Sidelying to sit: Modified independent (Device/Increase time)    Sit to supine: Modified independent (Device/Increase time)   General bed mobility comments: able to perform bed mobiltiy without assistance    Transfers Overall transfer level: Needs assistance Equipment used: None Transfers: Sit to/from Stand Sit to Stand: Modified independent (Device/Increase time)           General transfer comment: able to stand and maintain sternal precations     Balance Overall balance assessment: Needs assistance Sitting-balance support: No upper extremity supported, Feet supported Sitting balance-Leahy Scale: Good     Standing balance support: No upper extremity supported, During functional activity Standing balance-Leahy Scale: Good Standing balance comment: static and dynamics standing balance without an assistive device                           ADL either performed or assessed with clinical judgement   ADL Overall ADL's : Needs assistance/impaired                 Upper Body Dressing : Set up;Sitting Upper Body Dressing Details (indicate cue type and reason): to donn gown to cover back                   General ADL Comments: limited participation with self care due to patient stating, "I can do all that"    Extremity/Trunk Assessment Upper Extremity Assessment Upper Extremity Assessment: Overall WFL for tasks assessed (functional AROM during mobility)   Lower Extremity Assessment Lower Extremity Assessment: Overall WFL for tasks assessed (functional AROM during mobility)   Cervical / Trunk Assessment Cervical / Trunk Assessment: Normal    Vision       Perception     Praxis      Cognition  Arousal/Alertness: Awake/alert Behavior During Therapy: WFL for tasks assessed/performed Overall Cognitive Status: Within Functional Limits for tasks assessed                                 General Comments: able to recall sternal precautions, talkative during mobility        Exercises       Shoulder Instructions       General Comments VSS during mobility    Pertinent Vitals/ Pain       Pain Assessment Pain Assessment: Faces Faces Pain Scale: Hurts a little bit Pain Location: chest incision Pain Descriptors / Indicators: Discomfort Pain Intervention(s): Monitored during session  Home Living Family/patient expects to be discharged to:: Private residence Living Arrangements: Alone Available Help at Discharge: Family Type of Home: House Home Access: Stairs to enter Technical brewer of Steps: 3 Entrance Stairs-Rails: Can reach both Home Layout: One level     Bathroom Shower/Tub: Teacher, early years/pre: Standard     Home Equipment: None   Additional Comments: Going to call sister to see if she can come up and stay with him OR have neighbors check in on him      Prior Functioning/Environment              Frequency  Min 2X/week        Progress Toward Goals  OT Goals(current goals can now be found in the care plan section)  Progress towards OT goals: Progressing toward goals  Acute Rehab OT Goals Patient Stated Goal: go home OT Goal Formulation: With patient Time For Goal Achievement: 08/17/22 Potential to Achieve Goals: Good ADL Goals Pt Will Perform Grooming: Independently;standing Pt Will Perform Upper Body Bathing: Independently;sitting;standing Pt Will Perform Lower Body Bathing: Independently;sit to/from stand Pt Will Perform Upper Body Dressing: Independently;sitting;standing Pt Will Perform Lower Body Dressing: Independently;sit to/from stand Pt Will Transfer to Toilet: Independently;ambulating;regular height toilet Pt Will Perform Toileting - Clothing Manipulation and hygiene: Independently;sit to/from stand Additional ADL Goal #1: Pt will be Mod I in and OOB for basic ADLs following sternal precautions  Plan Discharge plan remains appropriate    Co-evaluation                 AM-PAC OT "6 Clicks" Daily  Activity     Outcome Measure   Help from another person eating meals?: None Help from another person taking care of personal grooming?: A Little Help from another person toileting, which includes using toliet, bedpan, or urinal?: A Little Help from another person bathing (including washing, rinsing, drying)?: A Little Help from another person to put on and taking off regular upper body clothing?: A Little Help from another person to put on and taking off regular lower body clothing?: A Little 6 Click Score: 19    End of Session    OT Visit Diagnosis: Unsteadiness on feet (R26.81);Muscle weakness (generalized) (M62.81)   Activity Tolerance Patient tolerated treatment well   Patient Left in bed;with call bell/phone within reach   Nurse Communication          Time: 7681-1572 OT Time Calculation (min): 13 min  Charges: OT General Charges $OT Visit: 1 Visit OT Treatments $Therapeutic Activity: 8-22 mins  Lodema Hong, [place]  Office [place] 08/04/2022, 12:21 PM

## 2022-08-04 NOTE — Evaluation (Signed)
Physical Therapy Evaluation Patient Details Name: Corey [name] MRN: [id number] DOB: 1941/04/06 Today's Date: 08/04/2022  History of Present Illness  81yo male admitted to [facility] on 07/31/22 with chest pain, workup for STEMI. Cardiac cath 07/31/22. 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 PMhx: CLL, HTN, HLD, gout   Clinical Impression  Patient evaluated by Physical Therapy with no further acute PT needs identified. All education has been completed and the patient has no further questions. Pt ambulated 250 ft supervision with no device, demonstrating stable dynamic balance during functional task. VSS during session. See below for any follow-up Physical Therapy or equipment needs. Mobility specialist will continue to follow pt as time permits. PT is signing off. Thank you for this referral.        Recommendations for follow up therapy are one component of a multi-disciplinary discharge planning process, led by the attending physician.  Recommendations may be updated based on patient status, additional functional criteria and insurance authorization.  Follow Up Recommendations Home health PT      Assistance Recommended at Discharge PRN  Patient can return home with the following  Assist for transportation;Assistance with cooking/housework;Help with stairs or ramp for entrance;A little help with bathing/dressing/bathroom    Equipment Recommendations None recommended by PT  Recommendations for Other Services       Functional Status Assessment Patient has not had a recent decline in their functional status     Precautions / Restrictions Precautions Precautions: Sternal Precaution Booklet Issued: No Precaution Comments: external pacer Restrictions Weight Bearing Restrictions: No      Mobility  Bed Mobility Overal bed mobility: Needs Assistance Bed Mobility: Rolling, Sidelying to Sit Rolling: Modified independent (Device/Increase time) Sidelying to sit: Modified independent (Device/Increase time) Supine to  sit: HOB elevated     General bed mobility comments: cues to remind pt about sternal precautions and appropriate hand placement to remain "in the tube", HOB minimally elevated    Transfers Overall transfer level: Needs assistance Equipment used: None Transfers: Sit to/from Stand Sit to Stand: Modified independent (Device/Increase time)           General transfer comment: mod independent for sit to stand with pt placing hands at appropriate location to maintain sternal precautions, bed height at lowest position    Ambulation/Gait Ambulation/Gait assistance: Supervision Gait Distance (Feet): 250 Feet Assistive device: None Gait Pattern/deviations: Step-to pattern, Decreased stride length, Narrow base of support Gait velocity: decreased     General Gait Details: pt verbalized gait speed was his typical for "a casual stroll", cues provided to widen BOS  Stairs            Wheelchair Mobility    Modified Rankin (Stroke Patients Only)       Balance Overall balance assessment: Needs assistance Sitting-balance support: No upper extremity supported, Feet supported Sitting balance-Leahy Scale: Good Sitting balance - Comments: dynamic sitting balance while weight shifting in all directions with no LOB   Standing balance support: No upper extremity supported, During functional activity Standing balance-Leahy Scale: Good Standing balance comment: stable during static standing. dynamic stance during clothing and line mgmt with no LOB and no UE support.               High Level Balance Comments: performed head turns during session while maintaining balance, able to negotiate turns with safe weight shifting without cuing necessary. pt "faking out" therapist with pretend stumble and recovered easily without any concern for fall.  Pertinent Vitals/Pain Pain Assessment Pain Assessment: Faces Faces Pain Scale: Hurts a little bit Pain Location: chest  incision Pain Descriptors / Indicators: Discomfort Pain Intervention(s): Monitored during session    Home Living Family/patient expects to be discharged to:: Private residence Living Arrangements: Alone Available Help at Discharge: Family Type of Home: House Home Access: Stairs to enter Entrance Stairs-Rails: Can reach both Entrance Stairs-Number of Steps: 3   Home Layout: One level Home Equipment: None Additional Comments: Going to call sister to see if she can come up and stay with him OR have neighbors check in on him    Prior Function Prior Level of Function : Independent/Modified Independent;Driving             Mobility Comments: Independent for mobility with no device, takes dog on daily walks ADLs Comments: Independent for self care and IADLs     Hand Dominance   Dominant Hand: Right    Extremity/Trunk Assessment   Upper Extremity Assessment Upper Extremity Assessment: Overall WFL for tasks assessed (functional AROM during mobility)    Lower Extremity Assessment Lower Extremity Assessment: Overall WFL for tasks assessed (functional AROM during mobility)    Cervical / Trunk Assessment Cervical / Trunk Assessment: Normal  Communication   Communication: No difficulties  Cognition Arousal/Alertness: Awake/alert Behavior During Therapy: WFL for tasks assessed/performed Overall Cognitive Status: Within Functional Limits for tasks assessed                                 General Comments: Pt AOx4, able to dual task, maintaining conversation while performing mobility. Makes lots of jokes and remarks, likely baseline personality but no family present to confirm. Recalled all sternal precautions without prompting.        General Comments General comments (skin integrity, edema, etc.): VSS during mobility    Exercises     Assessment/Plan    PT Assessment Patient does not need any further PT services  PT Problem List         PT Treatment  Interventions      PT Goals (Current goals can be found in the Care Plan section)  Acute Rehab PT Goals Patient Stated Goal: to get better sleep PT Goal Formulation: With patient Time For Goal Achievement: 08/18/22 Potential to Achieve Goals: Good    Frequency       Co-evaluation               AM-PAC PT "6 Clicks" Mobility  Outcome Measure Help needed turning from your back to your side while in a flat bed without using bedrails?: None Help needed moving from lying on your back to sitting on the side of a flat bed without using bedrails?: None Help needed moving to and from a bed to a chair (including a wheelchair)?: None Help needed standing up from a chair using your arms (e.g., wheelchair or bedside chair)?: None Help needed to walk in hospital room?: A Little Help needed climbing 3-5 steps with a railing? : A Little 6 Click Score: 22    End of Session Equipment Utilized During Treatment: Gait belt Activity Tolerance: Patient tolerated treatment well Patient left: in chair;with chair alarm set;with call bell/phone within reach Nurse Communication: Mobility status PT Visit Diagnosis: Other abnormalities of gait and mobility (R26.89)    Time: 7829-5621 PT Time Calculation (min) (ACUTE ONLY): 18 min   Charges:   PT Evaluation $PT Eval Low Complexity: 1 Low  Chipper Oman, SPT   Ikea Demicco 08/04/2022, 9:24 AM

## 2022-08-04 NOTE — Progress Notes (Addendum)
      [address]       [place] [address]             [phone]      3 Days Post-Op Procedure(s) (LRB): 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 USING LEFT INTERNAL MAMMARY AND RIGHT SAPHENOUS LEG VEIN HARVESTED ENDOSCOPICALLY (N/A) TRANSESOPHAGEAL ECHOCARDIOGRAM (TEE) (N/A)  Subjective:  Patient without complaints.  Overall he feels like he is doing well and is just taking up a bed here.  He does not wish to have home health services.  Objective: Vital signs in last 24 hours: Temp:  [97.5 F (36.4 C)-98.9 F (37.2 C)] 98.5 F (36.9 C) (11/11 0746) Pulse Rate:  [64-91] 83 (11/11 0905) Cardiac Rhythm: Normal sinus rhythm (11/10 1900) Resp:  [14-22] 16 (11/11 0905) BP: (96-132)/(52-66) 127/59 (11/11 0905) SpO2:  [90 %-96 %] 94 % (11/11 0817) Weight:  [75.4 kg] 75.4 kg (11/11 0548)  Intake/Output from previous day: 11/10 0701 - 11/11 0700 In: 240 [P.O.:240] Out: 100 [Urine:100]  General appearance: alert, cooperative, and no distress Heart: regular rate and rhythm Lungs: clear to auscultation bilaterally Abdomen: soft, non-tender; bowel sounds normal; no masses,  no organomegaly Extremities: edema trace Wound: clean and dry  Lab Results: Recent Labs    08/03/22 0412 08/04/22 0053  WBC 30.8* 27.4*  HGB 11.4* 10.8*  HCT 33.8* 31.9*  PLT 101* 103*   BMET:  Recent Labs    08/03/22 0412 08/04/22 0053  NA 135 134*  K 3.9 4.1  CL 102 106  CO2 23 23  GLUCOSE 91 102*  BUN 14 16  CREATININE 1.07 1.20  CALCIUM 7.5* 7.5*    PT/INR:  Recent Labs    08/01/22 1310  LABPROT 17.5*  INR 1.5*   ABG    Component Value Date/Time   PHART 7.320 (L) 08/01/2022 1801   HCO3 19.7 (L) 08/01/2022 1801   TCO2 21 (L) 08/01/2022 1801   ACIDBASEDEF 6.0 (H) 08/01/2022 1801   O2SAT 92 08/01/2022 1801   CBG (last 3)  Recent Labs    08/03/22 0341 08/03/22 0726 08/03/22 1136  GLUCAP 92 99 95    Assessment/Plan: S/P Procedure(s)  (LRB): 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 USING LEFT INTERNAL MAMMARY AND RIGHT SAPHENOUS LEG VEIN HARVESTED ENDOSCOPICALLY (N/A) TRANSESOPHAGEAL ECHOCARDIOGRAM (TEE) (N/A)  CV- NSR, BP stable- continue Lopressor, will d/c EPW.. plan to start Plavix at discharge Pulm- no issues off oxygen, continue IS Renal- creatinine bump to 1.20, will stop Lasix, potassium.. no edema on exam CLL Deconditioning- PT/OT recs H/H.. patient declines services Dispo- patient stable, d/c EPW today, plavix at discharge.. if remains stable likely ready in next 24-48 hours   LOS: 4 days    Corey Handler, PA-C 08/04/2022   Chart reviewed, patient examined, agree with above. He looks good and feels well. Possibly home tomorrow if no changes.

## 2022-08-04 NOTE — Care Management (Addendum)
Spoke with Corey [name] at bedside to discuss therapy recommendations for home health. Mr. Mccalla states he does not want home health arranged. States he would rather wait until he gets home. States his sister will be with him and he will decide when he gets home. Declined writer setting up home health at this time. Appears, per chart notes, Mr. Dowson declined home health with PA-C as well.   Marthenia Rolling, MSN, RN,BSN Inpatient [facility] Case Manager [phone]

## 2022-08-04 NOTE — Progress Notes (Signed)
Pt ambulated x 470 feet independently with steady gate

## 2022-08-04 NOTE — Progress Notes (Signed)
EPW discontinues per order.   Pt tolerated well.  Sites C/D/I Bed rest initiated x 1 hour.  Pt educated. Vital signs per protocol.

## 2022-08-05 LAB — GLUCOSE, CAPILLARY
Glucose-Capillary: 119 mg/dL — ABNORMAL HIGH (ref 70–99)
Glucose-Capillary: 98 mg/dL (ref 70–99)
Glucose-Capillary: 104 mg/dL — ABNORMAL HIGH (ref 70–99)

## 2022-08-05 MED ORDER — ACETYLCYSTEINE 20 % IN SOLN
3.0000 mL | Freq: Once | RESPIRATORY_TRACT | Status: AC
  Administered 2022-08-05: 3 mL via RESPIRATORY_TRACT
  Filled 2022-08-05 (×2): qty 4

## 2022-08-05 MED ORDER — HALOPERIDOL LACTATE 5 MG/ML IJ SOLN: 5.0000 mg | Freq: Once | INTRAMUSCULAR | Status: AC

## 2022-08-05 MED ORDER — TRAMADOL HCL 50 MG PO TABS: 50.0000 mg | ORAL_TABLET | Freq: Four times a day (QID) | ORAL | Status: DC | PRN

## 2022-08-05 MED ORDER — ALBUTEROL SULFATE (2.5 MG/3ML) 0.083% IN NEBU
INHALATION_SOLUTION | RESPIRATORY_TRACT | Status: AC
  Administered 2022-08-05: 2.5 mg
  Filled 2022-08-05: qty 3

## 2022-08-05 MED ORDER — METOPROLOL TARTRATE 25 MG PO TABS
25.0000 mg | ORAL_TABLET | Freq: Two times a day (BID) | ORAL | Status: DC
  Administered 2022-08-05 – 2022-08-06 (×2): 25 mg via ORAL
  Filled 2022-08-05 (×2): qty 1
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
## 2022-08-05 NOTE — Progress Notes (Addendum)
      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  This has since resolved.  He states he can't get up sputum.    Objective: Vital signs in last 24 hours: Temp:  [97.7 F (36.5 C)-98.3 F (36.8 C)] 98.2 F (36.8 C) (11/12 0907) Pulse Rate:  [78-94] 94 (11/12 0907) Cardiac Rhythm: Normal sinus rhythm (11/11 1920) Resp:  [16-21] 16 (11/12 0907) BP: (122-138)/(60-94) 128/71 (11/12 0907) SpO2:  [90 %-95 %] 92 % (11/12 0907) Weight:  [73 kg] 73 kg (11/12 0500)  Intake/Output from previous day: 11/11 0701 - 11/12 0700 In: 120 [P.O.:120] Out: -   General appearance: alert, cooperative, and no distress Heart: regular rate and rhythm Lungs: wheezes bilaterally Abdomen: soft, non-tender; bowel sounds normal; no masses,  no organomegaly Extremities: edema trace Wound: clean and dry  Lab Results: Recent Labs    08/03/22 0412 08/04/22 0053  WBC 30.8* 27.4*  HGB 11.4* 10.8*  HCT 33.8* 31.9*  PLT 101* 103*   BMET:  Recent Labs    08/03/22 0412 08/04/22 0053  NA 135 134*  K 3.9 4.1  CL 102 106  CO2 23 23  GLUCOSE 91 102*  BUN 14 16  CREATININE 1.07 1.20  CALCIUM 7.5* 7.5*    PT/INR: No results for input(s): "LABPROT", "INR" in the last 72 hours. ABG    Component Value Date/Time   PHART 7.320 (L) 08/01/2022 1801   HCO3 19.7 (L) 08/01/2022 1801   TCO2 21 (L) 08/01/2022 1801   ACIDBASEDEF 6.0 (H) 08/01/2022 1801   O2SAT 92 08/01/2022 1801   CBG (last 3)  Recent Labs    08/04/22 1634 08/04/22 2130 08/05/22 0603  GLUCAP 115* 137* 119*    Assessment/Plan: S/P Procedure(s) (LRB): 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  USING LEFT INTERNAL MAMMARY AND RIGHT SAPHENOUS LEG VEIN HARVESTED ENDOSCOPICALLY (N/A) TRANSESOPHAGEAL ECHOCARDIOGRAM (TEE) (N/A)  CV- Sinus Tach, BP stable- will increase Lopressor to 25 mg BID Pulm- difficulty expectorating sputum, will give Mucomyst today, continue Mucinex Confusion/Agitation overnight, resolved this morning... stop Oxycodone and monitor Deconditioning- patient lives alone, he states his sister will be there to assist him.. he declines H/H and is not agreeable to SNF placement CLL Dispo- patient with agitation, confusion overnight.. this required haldol.. improved this morning.. stop Oxy, will titrate BB for additional HR/BP control....aggressive pulmonary toilet with Mucomyst neb, mucinex.. patient not ready for d/c   LOS: 5 days    Ellwood Handler, PA-C 08/05/2022   Chart reviewed, patient examined, agree with above. He was very delirious last night but better today.

## 2022-08-05 NOTE — Plan of Care (Signed)
  Problem: Education: Goal: Knowledge of General Education information will improve Description Including pain rating scale, medication(s)/side effects and non-pharmacologic comfort measures Outcome: Progressing   

## 2022-08-06 DIAGNOSIS — I2121 ST elevation (STEMI) myocardial infarction involving left circumflex coronary artery: Secondary | ICD-10-CM | POA: Diagnosis not present

## 2022-08-06 LAB — BASIC METABOLIC PANEL
Anion gap: 12 (ref 5–15)
BUN: 13 mg/dL (ref 8–23)
CO2: 23 mmol/L (ref 22–32)
Calcium: 7.8 mg/dL — ABNORMAL LOW (ref 8.9–10.3)
Chloride: 101 mmol/L (ref 98–111)
Creatinine, Ser: 1.2 mg/dL (ref 0.61–1.24)
GFR, Estimated: >60 mL/min (ref >60)
Glucose, Bld: 93 mg/dL (ref 70–99)
Potassium: 3.7 mmol/L (ref 3.5–5.1)
Sodium: 136 mmol/L (ref 135–145)

## 2022-08-06 LAB — MAGNESIUM: Magnesium: 1.9 mg/dL (ref 1.7–2.4)

## 2022-08-06 MED ORDER — METOPROLOL TARTRATE 25 MG PO TABS: 25.0000 mg | ORAL_TABLET | Freq: Two times a day (BID) | ORAL | 3 refills | Status: DC

## 2022-08-06 MED ORDER — TRAMADOL HCL 50 MG PO TABS: 50.0000 mg | ORAL_TABLET | Freq: Four times a day (QID) | ORAL | 0 refills | Status: DC | PRN

## 2022-08-06 MED ORDER — ASPIRIN 81 MG PO TBEC: 81.0000 mg | DELAYED_RELEASE_TABLET | Freq: Every day | ORAL | Status: AC

## 2022-08-06 MED ORDER — CLOPIDOGREL BISULFATE 75 MG PO TABS: 75.0000 mg | ORAL_TABLET | Freq: Every day | ORAL | 3 refills | Status: DC

## 2022-08-06 MED ORDER — ACETAMINOPHEN 500 MG PO TABS: 1000.0000 mg | ORAL_TABLET | Freq: Four times a day (QID) | ORAL | 0 refills | Status: DC | PRN

## 2022-08-06 NOTE — Care Management Important Message (Signed)
Important Message  Patient Details  Name: Corey [name] MRN: [id number] Date of Birth: [birth year]   Medicare Important Message Given:        Orbie Pyo 08/06/2022, 2:59 PM

## 2022-08-06 NOTE — Progress Notes (Addendum)
      [address]       [place] [address]             [phone]      5 Days Post-Op Procedure(s) (LRB): 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 USING LEFT INTERNAL MAMMARY AND RIGHT SAPHENOUS LEG VEIN HARVESTED ENDOSCOPICALLY (N/A) TRANSESOPHAGEAL ECHOCARDIOGRAM (TEE) (N/A)  Subjective:  Patient had a good night.  He was able to cough up some sputum after Mucomyst yesterday.  + ambulation  + BM  Objective: Vital signs in last 24 hours: Temp:  [98 F (36.7 C)-98.6 F (37 C)] 98.2 F (36.8 C) (11/13 0438) Pulse Rate:  [68-94] 74 (11/13 0438) Cardiac Rhythm: Normal sinus rhythm (11/13 0345) Resp:  [16-20] 20 (11/13 0438) BP: (98-135)/(45-77) 134/77 (11/13 0438) SpO2:  [92 %-97 %] 93 % (11/13 0438) Weight:  [71.6 kg] 71.6 kg (11/13 0500)  General appearance: alert, cooperative, and no distress Heart: regular rate and rhythm Lungs: clear to auscultation bilaterally Abdomen: soft, non-tender; bowel sounds normal; no masses,  no organomegaly Extremities: edema none present Wound: clean and dry  Lab Results: Recent Labs    08/04/22 0053  WBC 27.4*  HGB 10.8*  HCT 31.9*  PLT 103*   BMET:  Recent Labs    08/04/22 0053  NA 134*  K 4.1  CL 106  CO2 23  GLUCOSE 102*  BUN 16  CREATININE 1.20  CALCIUM 7.5*    PT/INR: No results for input(s): "LABPROT", "INR" in the last 72 hours. ABG    Component Value Date/Time   PHART 7.320 (L) 08/01/2022 1801   HCO3 19.7 (L) 08/01/2022 1801   TCO2 21 (L) 08/01/2022 1801   ACIDBASEDEF 6.0 (H) 08/01/2022 1801   O2SAT 92 08/01/2022 1801   CBG (last 3)  Recent Labs    08/05/22 0603 08/05/22 1120 08/05/22 1642  GLUCAP 119* 98 104*    Assessment/Plan: S/P Procedure(s) (LRB): 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 USING LEFT INTERNAL MAMMARY AND RIGHT SAPHENOUS LEG VEIN HARVESTED ENDOSCOPICALLY (N/A) TRANSESOPHAGEAL ECHOCARDIOGRAM (TEE) (N/A)  CV- NSR, BP stable- continue  Lopressor Pulm- off oxygen, was able to expectorate some sputum Confusion/Agitation resolved Deconditioning-patient refuses SNF and home health placement Dispo- patient stable, will d/c home today, his sister is going to help patient at discharge   LOS: 6 days   Ellwood Handler, PA-C 08/06/2022  Patient seen and examined, agree with above Will dc home today as he does not want to go to [facility] C. Roxan Hockey, MD [facility] [phone]

## 2022-08-06 NOTE — Plan of Care (Signed)
DISCHARGE NOTE HOME Corey [name] to be discharged home per MD order. Discussed prescriptions and follow up appointments with the patient. Medication list explained in detail. Patient verbalized understanding.  Skin clean, dry and intact without evidence of skin break down, no evidence of skin tears noted. IV catheter discontinued intact. Site without signs and symptoms of complications. Dressing and pressure applied. Pt denies pain at the site currently. No complaints noted.  Patient free of lines, drains, and wounds.   An After Visit Summary (AVS) was printed and given to the patient. Patient escorted via wheelchair, and discharged home via private auto.  Stephan Minister, RN

## 2022-08-06 NOTE — Care Management Important Message (Signed)
Important Message  Patient Details  Name: Corey [name] MRN: [id number] Date of Birth: May 02, 1941   Medicare Important Message Given:  Yes     Memory Argue 08/06/2022, 3:54 PM

## 2022-08-06 NOTE — Care Management Important Message (Signed)
Important Message  Patient Details  Name: Corey [name] MRN: [id number] Date of Birth: [birth year]   Medicare Important Message Given:  Yes     Orbie Pyo 08/06/2022, 2:58 PM

## 2022-08-06 NOTE — Progress Notes (Signed)
Heart Failure Nurse Navigator Progress Note   Patient declined HF TOC hospital follow up appointment. Has a cardiology appointment with Dr. Oval Linsey on 08/20/22, that he states he will go to.   Earnestine Leys, BSN, Clinical cytogeneticist Only

## 2022-08-06 NOTE — Progress Notes (Signed)
Mobility Specialist Progress Note:   08/06/22 0900  Mobility  Activity Ambulated with assistance in hallway  Level of Assistance Modified independent, requires aide device or extra time  Assistive Device None  Distance Ambulated (ft) 550 ft  Activity Response Tolerated well  $Mobility charge 1 Mobility   Pt received in bed willing to participate in mobility. No complaints of pain. Left EOB with call bell in reach and all needs met.   [facility] Surveyor, mining Chat only

## 2022-08-06 NOTE — Progress Notes (Signed)
CARDIAC REHAB PHASE I    Ambulated in hall with mobility tolerating well. Plans for home today. Reviewed education for home. All questions and concerns addressed. Pt is agitated that discharge process is taking so long. Ronalee Belts RN notified.   [phone]  Vanessa Barbara, RN BSN 08/06/2022 10:12 AM

## 2022-08-06 NOTE — Progress Notes (Addendum)
   Heart Failure Stewardship Pharmacist Progress Note   PCP: Christain Sacramento, MD PCP-Cardiologist: None    HPI:  81 y.o. male with PMH significant for HTN, HLD, gout, BPH, and diverticulitis. He presented to the ED on 11/7 with reported midsternal pressure that radiated to his bilateral armpits while taking his dog out that morning. He reported lightheadedness at baseline, but more than normal. A code STEMI was called.   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 TTE on 11/7 showed 40-45% LVEF and grade I diastolic dysfunction and moderate hypokinesis of the LV, anterolateral wall, and inferolateral wall. Additionally, a small pericardial effusion was present. CABG x2 was performed on 11/8.   Current HF Medications: Beta Blocker: metoprolol tartrate '25mg'$  BID  Prior to admission HF Medications: None  Pertinent Lab Values: Serum creatinine 1.2, BUN 16, Potassium 4.1, Sodium 134, Magnesium 2.2, A1c 5.7  Vital Signs: Weight: 157.8 lbs (admission weight: 160 lbs) Blood pressure: 120-130/70s Heart rate: 70-90  I/O: none charted last 2 days  Medication Assistance / Insurance Benefits Check: Does the patient have prescription insurance?  Yes Type of insurance plan: AARP  Does the patient qualify for medication assistance through manufacturers or grants?   Pending Eligible grants and/or patient assistance programs: pending Medication assistance applications in progress: none  Medication assistance applications approved: none Approved medication assistance renewals will be completed by: pending  Outpatient Pharmacy:  Prior to admission outpatient [facility] Is the patient willing to use [facility] at discharge? Yes Is the patient willing to transition their outpatient pharmacy to utilize a [facility] outpatient pharmacy?   Pending  Assessment: 1. 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 NYHA class II symptoms. - Keep K>4 and Mg>2.  Strict I/Os. Daily standing weights.  - Transition to metoprolol succinate prior to discharge. - BP has been stable, no recent creatinine. Patient would benefit from the addition of ARB/ARNI, SGLT2i, and spironolactone. Can consider ARB with recent MI, then transitioning to [facility] if BP tolerates   Plan: 1) Considerations for outpatient follow-up: - Initiate Farxiga '10mg'$  once daily  - Initiate losartan 12.5 mg daily  2) Patient assistance: -Copays for Lovie Macadamia, and Entresto- $47  3)  Education  - Initial education provided -Full education to be provided prior to discharge.  Thank you for allowing pharmacy to participate in this patient's care.  Reatha Harps, PharmD PGY2 Cardiology Resident 08/06/2022 8:12 AM Check [email or web address] for unit specific pharmacy number

## 2022-08-06 NOTE — Progress Notes (Signed)
Rounding Note    Patient Name: Corey [name] Date of Encounter: 08/06/2022  [facility] Cardiologist: None   Subjective   Doing well this morning.  No chest pain or dyspnea.  Inpatient Medications    Scheduled Meds:  acetaminophen  1,000 mg Oral Q6H   Or   acetaminophen (TYLENOL) oral liquid 160 mg/5 mL  1,000 mg Per Tube Q6H   aspirin EC  325 mg Oral Daily   Or   aspirin  324 mg Per Tube Daily   atorvastatin  80 mg Oral Daily   bisacodyl  10 mg Oral Daily   Or   bisacodyl  10 mg Rectal Daily   Chlorhexidine Gluconate Cloth  6 each Topical Daily   docusate sodium  200 mg Oral Daily   enoxaparin (LOVENOX) injection  40 mg Subcutaneous QHS   finasteride  5 mg Oral Daily   metoprolol tartrate  25 mg Oral BID   pantoprazole  40 mg Oral Daily   sodium chloride flush  3 mL Intravenous Q12H   tamsulosin  0.4 mg Oral QPC supper   Continuous Infusions:  sodium chloride     PRN Meds: sodium chloride, alum & mag hydroxide-simeth, guaiFENesin, magnesium hydroxide, metoprolol tartrate, ondansetron (ZOFRAN) IV, mouth rinse, sodium chloride flush, traMADol   Vital Signs    Vitals:   08/06/22 0002 08/06/22 0438 08/06/22 0500 08/06/22 0855  BP: (!) 98/45 134/77  127/83  Pulse: 68 74  68  Resp: '20 20  16  '$ Temp: 98 F (36.7 C) 98.2 F (36.8 C)  97.9 F (36.6 C)  TempSrc: Oral Oral  Oral  SpO2: 93% 93%  96%  Weight:   71.6 kg   Height:       No intake or output data in the 24 hours ending 08/06/22 1018     08/06/2022    5:00 AM 08/05/2022    5:00 AM 08/04/2022    5:48 AM  Last 3 Weights  Weight (lbs) 157 lb 12.8 oz 160 lb 14.4 oz 166 lb 3.6 oz  Weight (kg) 71.578 kg 72.984 kg 75.4 kg      Telemetry    Sinus rhythm without significant arrhythmia - Personally Reviewed   Physical Exam  Alert, oriented, no distress GEN: No acute distress.   Neck: No JVD Cardiac: RRR, no murmurs, rubs, or gallops.  Respiratory: Clear to auscultation  bilaterally. GI: Soft, nontender, non-distended  MS: No edema; No deformity. Neuro:  Nonfocal  Psych: Normal affect   Labs    High Sensitivity Troponin:   Recent Labs  Lab 07/31/22 0759  TROPONINIHS 80*     Chemistry Recent Labs  Lab 07/31/22 0759 08/01/22 0603 08/02/22 0405 08/02/22 1813 08/03/22 0412 08/04/22 0053 08/06/22 0821  NA 140   < > 134* 133* 135 134* 136  K 4.9   < > 4.4 4.1 3.9 4.1 3.7  CL 106   < > 106 102 102 106 101  CO2 24   < > '24 23 23 23 23  '$ GLUCOSE 125*   < > 117* 109* 91 102* 93  BUN 9   < > '9 11 14 16 13  '$ CREATININE 1.04   < > 1.02 1.11 1.07 1.20 1.20  CALCIUM 8.8*   < > 7.2* 7.6* 7.5* 7.5* 7.8*  MG  --   --  2.3 2.2  --   --  1.9  PROT 5.8*  --   --   --   --   --   --  ALBUMIN 3.4*  --   --   --   --   --   --   AST 29  --   --   --   --   --   --   ALT 22  --   --   --   --   --   --   ALKPHOS 47  --   --   --   --   --   --   BILITOT 1.2  --   --   --   --   --   --   GFRNONAA >60   < > >60 >60 >60 >60 >60  ANIONGAP 10   < > 4* '8 10 5 12   '$ < > = values in this interval not displayed.    Lipids  Recent Labs  Lab 07/31/22 0759  CHOL 166  TRIG 109  HDL 45  LDLCALC 99  CHOLHDL 3.7    Hematology Recent Labs  Lab 08/02/22 1813 08/03/22 0412 08/04/22 0053  WBC 31.2* 30.8* 27.4*  RBC 3.54* 3.55* 3.35*  HGB 11.1* 11.4* 10.8*  HCT 34.4* 33.8* 31.9*  MCV 97.2 95.2 95.2  MCH 31.4 32.1 32.2  MCHC 32.3 33.7 33.9  RDW 13.8 13.9 14.0  PLT 104* 101* 103*   Thyroid No results for input(s): "TSH", "FREET4" in the last 168 hours.  BNPNo results for input(s): "BNP", "PROBNP" in the last 168 hours.  DDimer No results for input(s): "DDIMER" in the last 168 hours.   Radiology    No results found.   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  Possible D/C today. CLL - appears stable HL:  High dose statin HTN:  BP well controlled on current regimen.       For questions or updates, please contact [place] Please consult [email or web address] for contact info under        Signed, Early Osmond, MD  08/06/2022, 10:18 AM

## 2022-08-07 ENCOUNTER — Telehealth: Payer: Self-pay | Admitting: Physician Assistant

## 2022-08-07 ENCOUNTER — Telehealth: Payer: Self-pay | Admitting: Nurse Practitioner

## 2022-08-07 NOTE — Telephone Encounter (Signed)
Pt c/o medication issue:  1. Name of Medication:   clopidogrel (PLAVIX) 75 MG tablet  metoprolol tartrate (LOPRESSOR) 25 MG tablet   2. How are you currently taking this medication (dosage and times per day)? Plavix 1 tablet daily, metoprolol 1 tablet twice a day  3. Are you having a reaction (difficulty breathing--STAT)? no  4. What is your medication issue? Patient states he has been having stomach pains since starting the medications. He says he has also been having dry mouth and cannot eat from the dry mouth.

## 2022-08-07 NOTE — Telephone Encounter (Signed)
Received a page regarding stomach ache after discharge.  Called patient multiple time but going to voicemail straightly.  No voicemail left.  We will try to reach patient tomorrow.  I will forward this message to staff.

## 2022-08-07 NOTE — Telephone Encounter (Signed)
Left a message for the opt to call back.   Pt had STEMI and CABG 08/01/22.

## 2022-08-08 ENCOUNTER — Telehealth: Payer: Self-pay | Admitting: *Deleted

## 2022-08-08 NOTE — Telephone Encounter (Signed)
Contacted patient to verify call placed to answering service was returned. Per patient, he called to report extreme abdominal discomfort. Patient states he was told to take Dulcolax. Patient states he has a successful BM this morning but has been having loose stools since. Advised patient to discontinue taking the Dulcolax. Advised to drink plenty of water throughout the day and to move his body frequently. Advised he may begin taking stool softeners as needed. Answered questions pertaining to upcoming appts. Patient verbalized understanding.

## 2022-08-08 NOTE — Telephone Encounter (Signed)
Patient called back per note received in [facility].   Pt stated that he had severe pain / cramping / bloating in his lower abdomen yesterday morning.  Pt stated it was warm to the touch.  He was unsure if the plavix or lopressor was upsetting his stomach?  Pt states he took a dulcolax.  He was half way to the nearest Urgent Care, but decided to turn around and go home.    Pt states while home, he had multiple liquidly BM's; they were gritty, thick, and brown.    Pt asked if red blood seen in stool, or if stools appeared to be black in color;  Pt told this can be an indicator of a GI bleed.  Pt stated NO, and that this morning 11/15, he has NO pain and feels fine. No pain or bloating present.  Pt states he took the plavix and lopressor this morning, and is now asymptomatic.    Pt told that I will forward this information to the providers.

## 2022-08-14 MED FILL — Lidocaine HCl Local Preservative Free (PF) Inj 2%: INTRAMUSCULAR | Qty: 14 | Status: AC

## 2022-08-14 MED FILL — Magnesium Sulfate Inj 50%: INTRAMUSCULAR | Qty: 10 | Status: AC

## 2022-08-14 MED FILL — Mannitol IV Soln 20%: INTRAVENOUS | Qty: 500 | Status: AC

## 2022-08-14 MED FILL — Electrolyte-R (PH 7.4) Solution: INTRAVENOUS | Qty: 5000 | Status: AC

## 2022-08-14 MED FILL — Sodium Bicarbonate IV Soln 8.4%: INTRAVENOUS | Qty: 50 | Status: AC

## 2022-08-14 MED FILL — Sodium Chloride IV Soln 0.9%: INTRAVENOUS | Qty: 2000 | Status: AC

## 2022-08-14 MED FILL — Lidocaine HCl Local Soln Prefilled Syringe 100 MG/5ML (2%): INTRAMUSCULAR | Qty: 5 | Status: AC

## 2022-08-14 MED FILL — Heparin Sodium (Porcine) Inj 1000 Unit/ML: Qty: 1000 | Status: AC

## 2022-08-14 MED FILL — Potassium Chloride Inj 2 mEq/ML: INTRAVENOUS | Qty: 40 | Status: AC

## 2022-08-14 MED FILL — Heparin Sodium (Porcine) Inj 1000 Unit/ML: INTRAMUSCULAR | Qty: 20 | Status: AC

## 2022-08-15 ENCOUNTER — Telehealth: Payer: Self-pay | Admitting: Nurse Practitioner

## 2022-08-15 NOTE — Telephone Encounter (Signed)
Called and spoke to patient. Patient states that he recently experienced constipation, which resolved with one dose of dulcolax. Dulcolax caused several loose stools which "cleared me out". He has been drinking a lot of water ever since and is concerned because when he tries to urinate, he only urinates a little and experiences burning. He last urinated this morning and has been urinating 2-3 times per day for the last few days. He states that he takes his finasteride and tamsulosin every day. He last saw his urologist 3 or 4 months ago. Advised patient to call urologist as this is probably related to his history of BPH and not his recent CABG. Patient verbalized understanding.

## 2022-08-15 NOTE — Telephone Encounter (Signed)
Patient states for the past 4 days he has had difficulty urinating. He says he has no other symptoms.

## 2022-08-15 NOTE — Telephone Encounter (Signed)
See encounter from today 08/15/22.

## 2022-08-18 NOTE — Progress Notes (Signed)
Cardiology Office Note:    Date:  08/20/2022   ID:  Corey [name], Corey [name] 16-Aug-1941, MRN [id number]  PCP:  Christain Sacramento, MD   [facility] Providers Cardiologist:  Sherren Mocha, MD     Referring MD: Christain Sacramento, MD   Chief Complaint:   History of Present Illness:    Corey [name] is a  81 y.o. male with a hx of hypertension, CLL, hyperlipidemia. Has been followed at [facility] for CLL, has been stage 0 with no significant signs of progression at last office visit on 05/23/2022 with history of mild thrombocytopenia but stable counts.  No prior cardiac history.  His father needed CABG in his 103s but passed away before the surgery could be done.  Admitted to [facility] 07/31/2022 for chest pain.  Reports episode of chest pain several days prior while outside walking his dog.  It was brief in nature but associated with shortness of breath.  On the day of admission he was again walking his dog when he developed centralized chest pain with radiation into bilateral arms with shortness of breath.  Symptoms were quite severe and prompted him to call EMS.  EKG in the field showed diffuse ST depression with ST elevation in aVR.  Code STEMI was called in the field.  He was given 324 milligrams ASA, SL nitro via EMS.  On arrival his pain had mostly resolved.  EKG showed slight improvement in ST depression.  Of note there was a simultaneous STEMI called which was transported to the Cath Lab emergently given ongoing chest pain.  Dr. Burt Knack evaluated patient in the ED and transported him to the Cath Lab for further management.   Left heart cath 07/31/2022 revealed thrombotic ulcerated severe stenosis of the ostial/proximal circumflex (culprit lesion), moderately severe calcific distal left main stenosis of 70%, moderately severe calcified proximal LAD stenosis of 70%, mild diffuse nonobstructive plaquing of a large dominant RCA which supplies a large PDA branch and multiple posterior lateral branches,  normal LV systolic function with no regional wall abnormalities.  Difficult anatomy for PCI due to calcific distal left main and ostial LAD stenosis and severe ostial circumflex stenosis. Cardiac surgery called and evaluated the patient's cath films while on the table. Agreement that best suited for coronary bypass surgery.  He underwent CABG x 2 on 08/01/2022 with LIMA to LAD, SVG to ramus intermedius, endoscopic vein harvest right thigh.  Postop operative CXR showed gastric dilatation.  He was treated with Reglan and diet was advanced slowly.  He became agitated and confused overnight and was combative towards staff and required treatment with Haldol. Discharged home on 08/06/2022. Did not wish to have home OT/PT.   He called our office to report stomach pain on 08/07/22 which resolved the following day. He called 11/22 to report burning with urination and was encouraged to call urologist.   Today, he is here alone for follow-up. Reports since surgery he cannot taste anything, has problems with his eyesight, and difficulty hearing. Reports he is more fatigued than prior to surgery. Soreness left chest that radiates into his back. Able to walk > 15 minutes twice daily, weather dependent, with no increase in chest discomfort or SOB with walking. Feels more discomfort in cold air. Is using spirometer with some improvement noted. Cannot sleep on left side because it causes pain in the right side. He still has chest tube sutures intake. Sternotomy incision is well-healed, no signs of infection. He denies lower extremity  edema,  palpitations, melena, hematuria, hemoptysis, diaphoresis, weakness, presyncope, syncope, orthopnea, and PND. Home BP 137/70 mmHg, HR 50s-60s bpm. Main complaint is urinary retention, does not want to see urology but understands he needs to contact them for follow-up.    Past Medical History:  Diagnosis Date   CLL (chronic lymphocytic leukemia) ([place])    Diverticulitis    Gout     Hypercholesteremia    Hypertension    Prostate disorder     Past Surgical History:  Procedure Laterality Date   APPENDECTOMY     CORONARY ARTERY BYPASS GRAFT N/A 08/01/2022   Procedure: 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 USING LEFT INTERNAL MAMMARY AND RIGHT SAPHENOUS LEG VEIN HARVESTED ENDOSCOPICALLY;  Surgeon: Melrose Nakayama, MD;  Location: [place];  Service: Open Heart Surgery;  Laterality: N/A;   LEFT HEART CATH AND CORONARY ANGIOGRAPHY N/A 07/31/2022   Procedure: LEFT HEART CATH AND CORONARY ANGIOGRAPHY;  Surgeon: Sherren Mocha, MD;  Location: [place] [facility];  Service: Cardiovascular;  Laterality: N/A;   TEE WITHOUT CARDIOVERSION N/A 08/01/2022   Procedure: TRANSESOPHAGEAL ECHOCARDIOGRAM (TEE);  Surgeon: Melrose Nakayama, MD;  Location: [place];  Service: Open Heart Surgery;  Laterality: N/A;    Current Medications: Current Meds  Medication Sig   allopurinol (ZYLOPRIM) 300 MG tablet Take 300 mg by mouth daily.   aspirin EC 81 MG tablet Take 1 tablet (81 mg total) by mouth daily.   clopidogrel (PLAVIX) 75 MG tablet Take 1 tablet (75 mg total) by mouth daily.   Cyanocobalamin (VITAMIN B 12) 500 MCG TABS Take 1 tablet by mouth daily.   finasteride (PROSCAR) 5 MG tablet Take 5 mg by mouth daily.   metoprolol tartrate (LOPRESSOR) 25 MG tablet Take 1 tablet (25 mg total) by mouth 2 (two) times daily.   omeprazole (PRILOSEC) 40 MG capsule Take 40 mg by mouth daily.   pravastatin (PRAVACHOL) 40 MG tablet Take 20 mg by mouth daily.    Tamsulosin HCl (FLOMAX) 0.4 MG CAPS Take 0.4 mg by mouth daily after supper.    [DISCONTINUED] acetaminophen (TYLENOL) 500 MG tablet Take 2 tablets (1,000 mg total) by mouth every 6 (six) hours as needed.   [DISCONTINUED] traMADol (ULTRAM) 50 MG tablet Take 1 tablet (50 mg total) by mouth every 6 (six) hours as needed for severe pain.     Allergies:   Patient has no known allergies.   Social History   Socioeconomic History    Marital status: Divorced    Spouse name: Not on file   Number of children: Not on file   Years of education: Not on file   Highest education level: Not on file  Occupational History   Not on file  Tobacco Use   Smoking status: Never   Smokeless tobacco: Never  Vaping Use   Vaping Use: Never used  Substance and Sexual Activity   Alcohol use: Yes    Comment: occasional   Drug use: No   Sexual activity: Not on file  Other Topics Concern   Not on file  Social History Narrative   Not on file   Social Determinants of Health   Financial Resource Strain: Not on file  Food Insecurity: Not on file  Transportation Needs: Not on file  Physical Activity: Not on file  Stress: Not on file  Social Connections: Not on file     Family History: [name] family history includes Heart attack in his father; Hypertension in his father.  ROS:  Please see the history of present illness.    + fatigue  + chest discomfort All other systems reviewed and are negative.  Labs/Other Studies Reviewed:    The following studies were reviewed today:  LHC 07/31/22    Ost LAD to Prox LAD lesion is 70% stenosed.   Mid LM to Ost LAD lesion is 70% stenosed.   Ost Cx to Prox Cx lesion is 95% stenosed.   The left ventricular systolic function is normal.   LV end diastolic pressure is normal.   The left ventricular ejection fraction is 55-65% by visual estimate.   1.  Thrombotic, ulcerated severe stenosis of the ostial/proximal circumflex (culprit lesion) 2.  Moderately severe calcific distal left main stenosis of 70% 3.  Moderately severe calcified proximal LAD stenosis of 70% 4.  Mild diffuse nonobstructive plaquing of a large, dominant RCA which supplies a large PDA branch and multiple posterolateral branches 5.  Normal LV systolic function with no regional wall motion abnormalities   Recommendations: Difficult anatomy for PCI due to calcific distal left main and ostial LAD stenosis and severe  ostial circumflex stenosis.  Cardiac surgery called and evaluated the patient's cath films with me while he was on the table.  We agree that he is best suited for coronary bypass surgery.  He will be continued on heparin and will have formal cardiac surgical consultation.  At the completion of the procedure, the patient is chest pain-free and his ST segments have normalized.  Echo 07/31/22  1. Left ventricular ejection fraction, by estimation, is 40 to 45%. The  left ventricle has mildly decreased function. The left ventricle  demonstrates regional wall motion abnormalities (see scoring  diagram/findings for description). Left ventricular  diastolic parameters are consistent with Grade I diastolic dysfunction  (impaired relaxation). There is moderate hypokinesis of the left  ventricular, entire anterolateral wall and inferolateral wall.   2. Right ventricular systolic function is normal. The right ventricular  size is normal. Tricuspid regurgitation signal is inadequate for assessing  PA pressure.   3. Left atrial size was mildly dilated.   4. A small pericardial effusion is present. The pericardial effusion is  anterior to the right ventricle.   5. The mitral valve is normal in structure. No evidence of mitral valve  regurgitation.   6. The aortic valve is tricuspid. There is mild calcification of the  aortic valve. There is mild thickening of the aortic valve. Aortic valve  regurgitation is not visualized. Aortic valve sclerosis/calcification is  present, without any evidence of  aortic stenosis.   7. The inferior vena cava is normal in size with greater than 50%  respiratory variability, suggesting right atrial pressure of 3 mmHg.  Recent Labs: 07/31/2022: ALT 22 08/04/2022: Hemoglobin 10.8; Platelets 103 08/06/2022: BUN 13; Creatinine, Ser 1.20; Magnesium 1.9; Potassium 3.7; Sodium 136  Recent Lipid Panel    Component Value Date/Time   CHOL 166 07/31/2022 0759   TRIG 109  07/31/2022 0759   HDL 45 07/31/2022 0759   CHOLHDL 3.7 07/31/2022 0759   VLDL 22 07/31/2022 0759   LDLCALC 99 07/31/2022 0759     Risk Assessment/Calculations:      Physical Exam:    VS:  BP 138/80   Pulse (!) 50   Ht _0  (1.753 m)   Wt 155 lb 12.8 oz (70.7 kg)   SpO2 96%   BMI 23.01 kg/m     Wt Readings from Last 3 Encounters:  08/20/22 155 lb 12.8 oz (  70.7 kg)  08/06/22 157 lb 12.8 oz (71.6 kg)  05/23/22 163 lb 3.2 oz (74 kg)     GEN:  Well nourished, well developed in no acute distress HEENT: Normal NECK: No JVD; No carotid bruits CARDIAC: RRR, no murmurs, rubs, gallops RESPIRATORY:  Clear to auscultation without rales, wheezing or rhonchi  ABDOMEN: Soft, non-tender, non-distended. Sternotomy incision is well-approximated without s/s of infection. Chest tube sutures removed without bleeding or drainage MUSCULOSKELETAL:  No edema; No deformity. 2+ pedal pulses, equal bilaterally SKIN: Warm and dry NEUROLOGIC:  Alert and oriented x 3 PSYCHIATRIC:  Normal affect   EKG:  EKG is ordered today.  The ekg ordered today demonstrates sinus bradycardia at 50 bpm, low voltage QRS  Diagnoses:    1. Coronary artery disease involving native coronary artery of native heart without angina pectoris   2. Essential hypertension   3. S/P CABG x 2   4. Hyperlipidemia LDL goal <70   5. Other fatigue    Assessment and Plan:     CAD s/p CABG: Presented with chest pain 11/7, s/p STEMI. LHC showed thrombotic, ulcerated severe stenosis of ostial/proximal circumflex as culprit lesion as well as moderately severe calcific thick left main stenosis and proximal LAD stenosis. He underwent CABG x 2 11/8. Feels fatigued, is sleeping more, but overall seems to continue to be gradually improving. Is progressing his activity. Advised continued sternal precautions and to continue to gradually increase activity. Chest tube sutures were removed, sites well-approximated edges, no drainage. Continue  metoprolol, pravastatin, clopidogrel, aspirin. We will recheck lipids in 2 months for LDL goal < 70.   Hypertension: BP is well-controlled. No medication changes today.   Fatigue: He reports fatigue since hospital discharge that is gradually improving. Discussion of reducing metoprolol dose, however he would like to continue to monitor symptoms prior to making any medication changes. Advised him to notify us prior to next office visit if symptoms do not continue to improve.   Hyperlipidemia LDL goal < 70: LDL 99 on 07/31/2022. On Pravastatin 20 mg daily. We will recheck in 2 months. May need higher intensity statin.      Cardiac Rehabilitation Eligibility Assessment  The patient is ready to start cardiac rehabilitation pending clearance from the cardiac surgeon.     Disposition: 3 months with Dr. Burt Knack or APP  Medication Adjustments/Labs and Tests Ordered: Current medicines are reviewed at length with the patient today.  Concerns regarding medicines are outlined above.  Orders Placed This Encounter  Procedures   Lipid panel   Comp Met (CMET)   EKG 12-Lead   No orders of the defined types were placed in this encounter.   Patient Instructions  Medication Instructions:  Your physician recommends that you continue on your current medications as directed. Please refer to the Current Medication list given to you today.  *If you need a refill on your cardiac medications before your next appointment, please call your pharmacy*   Lab Work: 10/08/22:  COME TO THE LAB, ANYTIME AFTER 7:15 A.M, NOTHING TO EAT OR DRINK AFTER MIDNIGHT THE NIGHT BEFORE, FOR:  CMET & LIPID 3 If you have labs (blood work) drawn today and your tests are completely normal, you will receive your results only by: [place] (if you have MyChart) OR A paper copy in the mail If you have any lab test that is abnormal or we need to change your treatment, we will call you to review the  results.   Testing/Procedures: None ordered  Follow-Up: At [facility], you and your health needs are our priority.  As part of our continuing mission to provide you with exceptional heart care, we have created designated Provider Care Teams.  These Care Teams include your primary Cardiologist (physician) and Advanced Practice Providers (APPs -  Physician Assistants and Nurse Practitioners) who all work together to provide you with the care you need, when you need it.  We recommend signing up for the patient portal called "MyChart".  Sign up information is provided on this After Visit Summary.  MyChart is used to connect with patients for Virtual Visits (Telemedicine).  Patients are able to view lab/test results, encounter notes, upcoming appointments, etc.  Non-urgent messages can be sent to your provider as well.   To learn more about what you can do with MyChart, go to [email or web address].    Your next appointment:   3 month(s)  The format for your next appointment:   In Person  Provider:   Sherren Mocha, MD  or Christen Bame, NP         Other Instructions   Important Information About Sugar         Signed, Emmaline Life, NP  08/20/2022 12:24 PM    [place]

## 2022-08-20 ENCOUNTER — Encounter: Payer: Self-pay | Admitting: Nurse Practitioner

## 2022-08-20 ENCOUNTER — Ambulatory Visit: Payer: Medicare Other | Attending: Nurse Practitioner | Admitting: Nurse Practitioner

## 2022-08-20 VITALS — BP 138/80 | HR 50 | Ht 69.0 in | Wt 155.8 lb

## 2022-08-20 DIAGNOSIS — I10 Essential (primary) hypertension: Secondary | ICD-10-CM

## 2022-08-20 DIAGNOSIS — I2510 Atherosclerotic heart disease of native coronary artery without angina pectoris: Secondary | ICD-10-CM | POA: Diagnosis not present

## 2022-08-20 DIAGNOSIS — E785 Hyperlipidemia, unspecified: Secondary | ICD-10-CM

## 2022-08-20 DIAGNOSIS — R5383 Other fatigue: Secondary | ICD-10-CM

## 2022-08-20 DIAGNOSIS — Z951 Presence of aortocoronary bypass graft: Secondary | ICD-10-CM | POA: Diagnosis not present

## 2022-08-20 NOTE — Patient Instructions (Signed)
Medication Instructions:  Your physician recommends that you continue on your current medications as directed. Please refer to the Current Medication list given to you today.  *If you need a refill on your cardiac medications before your next appointment, please call your pharmacy*   Lab Work: 10/08/22:  COME TO THE LAB, ANYTIME AFTER 7:15 A.M, NOTHING TO EAT OR DRINK AFTER MIDNIGHT THE NIGHT BEFORE, FOR:  CMET & LIPID 3 If you have labs (blood work) drawn today and your tests are completely normal, you will receive your results only by: [place] (if you have MyChart) OR A paper copy in the mail If you have any lab test that is abnormal or we need to change your treatment, we will call you to review the results.   Testing/Procedures: None ordered   Follow-Up: At [facility], you and your health needs are our priority.  As part of our continuing mission to provide you with exceptional heart care, we have created designated Provider Care Teams.  These Care Teams include your primary Cardiologist (physician) and Advanced Practice Providers (APPs -  Physician Assistants and Nurse Practitioners) who all work together to provide you with the care you need, when you need it.  We recommend signing up for the patient portal called "MyChart".  Sign up information is provided on this After Visit Summary.  MyChart is used to connect with patients for Virtual Visits (Telemedicine).  Patients are able to view lab/test results, encounter notes, upcoming appointments, etc.  Non-urgent messages can be sent to your provider as well.   To learn more about what you can do with MyChart, go to [email or web address].    Your next appointment:   3 month(s)  The format for your next appointment:   In Person  Provider:   Sherren Mocha, MD  or Christen Bame, NP         Other Instructions   Important Information About Sugar

## 2022-08-28 ENCOUNTER — Other Ambulatory Visit: Payer: Self-pay | Admitting: Thoracic Surgery (Cardiothoracic Vascular Surgery)

## 2022-08-28 ENCOUNTER — Ambulatory Visit
Admission: RE | Admit: 2022-08-28 | Discharge: 2022-08-28 | Disposition: A | Discharge status: Home / Self Care | Payer: Medicare Other | Source: Ambulatory Visit | Attending: Thoracic Surgery (Cardiothoracic Vascular Surgery) | Admitting: Thoracic Surgery (Cardiothoracic Vascular Surgery)

## 2022-08-28 ENCOUNTER — Ambulatory Visit (INDEPENDENT_AMBULATORY_CARE_PROVIDER_SITE_OTHER): Payer: Self-pay | Admitting: Thoracic Surgery (Cardiothoracic Vascular Surgery)

## 2022-08-28 VITALS — BP 123/64 | HR 53 | Resp 20 | Ht 69.0 in | Wt 152.0 lb

## 2022-08-28 DIAGNOSIS — I2121 ST elevation (STEMI) myocardial infarction involving left circumflex coronary artery: Secondary | ICD-10-CM

## 2022-08-28 DIAGNOSIS — Z951 Presence of aortocoronary bypass graft: Secondary | ICD-10-CM

## 2022-08-28 MED ORDER — METOPROLOL SUCCINATE ER 25 MG PO TB24: 25.0000 mg | ORAL_TABLET | Freq: Every day | ORAL | 5 refills | Status: DC

## 2022-08-28 MED ORDER — FUROSEMIDE 20 MG PO TABS: 20.0000 mg | ORAL_TABLET | Freq: Every day | ORAL | 0 refills | Status: DC

## 2022-08-28 NOTE — Progress Notes (Signed)
[address]       [place], [address]             [phone]     HPI: [name] returns for a scheduled follow-up visit  Corey [name] is an 81 year old man with a history of hypertension, hyperlipidemia, CLL, gout, diverticulitis, BPH, aortic valve sclerosis, and coronary disease.    He presented with code STEMI.  He had no cardiac history prior to that.  At catheterization he was found to have a 70% left main, 70% LAD, 95% ostial stenosis of the circumflex.  He underwent coronary bypass grafting x2 on 08/01/2022.  His postoperative course was uncomplicated and he went home on day 5.  Overall he feels well.  He has not had any recurrent angina.  He does have some achiness in the sternum.  He is not taking any narcotics.  Denies any swelling in his legs.  Does occasionally have some dizziness and attributes it to his metoprolol.  He wants to know if we can decrease that medication.  Past Medical History:  Diagnosis Date   CLL (chronic lymphocytic leukemia) ([facility])    Diverticulitis    Gout    Hypercholesteremia    Hypertension    Prostate disorder     Current Outpatient Medications  Medication Sig Dispense Refill   allopurinol (ZYLOPRIM) 300 MG tablet Take 300 mg by mouth daily.     aspirin EC 81 MG tablet Take 1 tablet (81 mg total) by mouth daily.     clopidogrel (PLAVIX) 75 MG tablet Take 1 tablet (75 mg total) by mouth daily. 30 tablet 3   Cyanocobalamin (VITAMIN B 12) 500 MCG TABS Take 1 tablet by mouth daily.     finasteride (PROSCAR) 5 MG tablet Take 5 mg by mouth daily.     furosemide (LASIX) 20 MG tablet Take 1 tablet (20 mg total) by mouth daily. 5 tablet 0   metoprolol succinate (TOPROL XL) 25 MG 24 hr tablet Take 1 tablet (25 mg total) by mouth daily. 30 tablet 5   omeprazole (PRILOSEC) 40 MG capsule Take 40 mg by mouth daily.     pravastatin (PRAVACHOL) 40 MG tablet Take 20 mg by mouth daily.      Tamsulosin HCl (FLOMAX) 0.4 MG CAPS Take 0.4  mg by mouth in the morning and at bedtime.     No current facility-administered medications for this visit.    Physical Exam BP 123/64 (BP Location: Left Arm, Patient Position: Sitting)   Pulse (!) 53   Resp 20   Ht '5\' 9"'$  (1.753 m)   Wt 152 lb (68.9 kg)   SpO2 96% Comment: RA  BMI 22.25 kg/m  81 year old man in no acute distress Alert and oriented x3 with no focal deficits Lungs clear with equal breath sounds bilaterally Cardiac regular rate and rhythm Sternum stable, incision well-healed No peripheral edema  Diagnostic Tests: CHEST - 2 VIEW   COMPARISON:  08/04/2022   FINDINGS: The cardiac silhouette, mediastinal and hilar contours are normal. No pulmonary edema or pulmonary infiltrates. Small bilateral pleural effusions are noted with some overlying atelectasis.   IMPRESSION: Small bilateral pleural effusions and overlying atelectasis.     Electronically Signed   By: Marijo Sanes M.D.   On: 08/28/2022 11:23  Impression: Corey [name] is an 81 year old man with a history of hypertension, hyperlipidemia, CLL, gout, diverticulitis, BPH, aortic valve sclerosis, and coronary disease.    Left main and two-vessel coronary  disease-status post coronary bypass grafting on 07/01/2022.  No recurrent angina.  Status post CABG-has some discomfort but is not requiring any medication for it.  He looks great.  At this point he may begin driving.  Appropriate precautions were discussed.  Advised him not to lift anything over 10 pounds for another week and nothing over 20 pounds for 3 more weeks.  Beyond that there are no restrictions on his activities.  Bilateral pleural effusions-noted on chest x-ray.  These are small.  I prescribed Lasix 20 mg p.o. daily for 5 days.  He knows to watch his weight and if he sees his weight increase more than 3 pounds in a day or 5 pounds in 3 days then he should let us know.  Bradycardia-symptomatic.  On Lopressor 25 mg twice daily.  I am going to  stop metoprolol and start Toprol-XL 25 mg daily.  Plan: Change metoprolol to Toprol-XL 25 mg p.o. daily Lasix 20 mg daily for 5 days Follow-up with Dr. Burt Knack as scheduled next month. I will be happy to see [name] back anytime in the future if I can be of any further assistance with his care  Melrose Nakayama, MD [facility] [phone]

## 2022-09-03 ENCOUNTER — Telehealth (HOSPITAL_COMMUNITY): Payer: Self-pay

## 2022-09-10 ENCOUNTER — Other Ambulatory Visit: Payer: Medicare Other

## 2022-10-01 ENCOUNTER — Encounter (HOSPITAL_COMMUNITY): Payer: Self-pay

## 2022-10-01 ENCOUNTER — Telehealth (HOSPITAL_COMMUNITY): Payer: Self-pay

## 2022-10-01 NOTE — Telephone Encounter (Signed)
Pt insurance is active and benefits verified through [facility] Medicare Co-pay 0, DED 0/0 met, out of pocket $3,600/0 met, co-insurance 0%. no pre-authorization required. Passport, 10/01/2022'@10'$ :36UZ, REF# [id number]   How many CR sessions are covered? (72 sessions for ICR)72 Is this a lifetime maximum or an annual maximum? annual Has the member used any of these services to date? no Is there a time limit (weeks/months) on start of program and/or program completion? no

## 2022-10-08 ENCOUNTER — Ambulatory Visit: Payer: Medicare Other | Attending: Nurse Practitioner

## 2022-10-08 DIAGNOSIS — I10 Essential (primary) hypertension: Secondary | ICD-10-CM

## 2022-10-08 DIAGNOSIS — Z951 Presence of aortocoronary bypass graft: Secondary | ICD-10-CM

## 2022-10-08 LAB — COMPREHENSIVE METABOLIC PANEL
ALT: 8 IU/L (ref 0–44)
AST: 12 IU/L (ref 0–40)
Albumin/Globulin Ratio: 2.2 (ref 1.2–2.2)
Albumin: 3.9 g/dL (ref 3.7–4.7)
Alkaline Phosphatase: 54 IU/L (ref 44–121)
BUN/Creatinine Ratio: 9 — ABNORMAL LOW (ref 10–24)
BUN: 9 mg/dL (ref 8–27)
Bilirubin Total: 1 mg/dL (ref 0.0–1.2)
CO2: 28 mmol/L (ref 20–29)
Calcium: 9.2 mg/dL (ref 8.6–10.2)
Chloride: 105 mmol/L (ref 96–106)
Creatinine, Ser: 0.95 mg/dL (ref 0.76–1.27)
Globulin, Total: 1.8 g/dL (ref 1.5–4.5)
Glucose: 93 mg/dL (ref 70–99)
Potassium: 3.7 mmol/L (ref 3.5–5.2)
Sodium: 144 mmol/L (ref 134–144)
Total Protein: 5.7 g/dL — ABNORMAL LOW (ref 6.0–8.5)
eGFR: 80 mL/min/{1.73_m2} (ref >59)

## 2022-10-08 LAB — LIPID PANEL
Chol/HDL Ratio: 4.5 ratio (ref 0.0–5.0)
Cholesterol, Total: 156 mg/dL (ref 100–199)
HDL: 35 mg/dL — ABNORMAL LOW (ref >39)
LDL Chol Calc (NIH): 101 mg/dL — ABNORMAL HIGH (ref 0–99)
Triglycerides: 109 mg/dL (ref 0–149)
VLDL Cholesterol Cal: 20 mg/dL (ref 5–40)

## 2022-10-09 ENCOUNTER — Telehealth: Payer: Self-pay | Admitting: *Deleted

## 2022-10-09 DIAGNOSIS — E785 Hyperlipidemia, unspecified: Secondary | ICD-10-CM

## 2022-10-09 MED ORDER — ROSUVASTATIN CALCIUM 40 MG PO TABS: 40.0000 mg | ORAL_TABLET | Freq: Every day | ORAL | 3 refills | Status: DC

## 2022-10-10 ENCOUNTER — Telehealth (HOSPITAL_COMMUNITY): Payer: Self-pay

## 2022-10-11 ENCOUNTER — Encounter (HOSPITAL_COMMUNITY): Payer: Self-pay

## 2022-10-11 ENCOUNTER — Encounter (HOSPITAL_COMMUNITY)
Admission: RE | Admit: 2022-10-11 | Discharge: 2022-10-11 | Disposition: A | Discharge status: Home / Self Care | Payer: Medicare Other | Source: Ambulatory Visit | Attending: Thoracic Surgery (Cardiothoracic Vascular Surgery) | Admitting: Thoracic Surgery (Cardiothoracic Vascular Surgery)

## 2022-10-11 VITALS — BP 122/54 | HR 53 | Ht 69.0 in | Wt 149.7 lb

## 2022-10-11 DIAGNOSIS — Z951 Presence of aortocoronary bypass graft: Secondary | ICD-10-CM | POA: Insufficient documentation

## 2022-10-11 HISTORY — DX: Atherosclerotic heart disease of native coronary artery without angina pectoris: I25.10

## 2022-10-11 NOTE — Progress Notes (Signed)
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  Patient's Home Medications on Admission:  Current Outpatient Medications:    allopurinol (ZYLOPRIM) 300 MG tablet, Take 300 mg by mouth daily., Disp: , Rfl:    aspirin EC 81 MG tablet, Take 1 tablet (81 mg total) by mouth daily., Disp: , Rfl:    clopidogrel (PLAVIX) 75 MG tablet, Take 1 tablet (75 mg total) by mouth daily., Disp: 30 tablet, Rfl: 3   Cyanocobalamin (VITAMIN B 12) 500 MCG TABS, Take 1 tablet by mouth daily., Disp: , Rfl:    finasteride (PROSCAR) 5 MG tablet, Take 5 mg by mouth daily., Disp: , Rfl:    furosemide (LASIX) 20 MG tablet, Take 1 tablet (20 mg total) by mouth daily., Disp: 5 tablet, Rfl: 0   metoprolol succinate (TOPROL XL) 25 MG 24 hr tablet, Take 1 tablet (25 mg total) by mouth daily., Disp: 30 tablet, Rfl: 5   omeprazole (PRILOSEC) 40 MG capsule, Take 40 mg by mouth daily., Disp: , Rfl:    rosuvastatin (CRESTOR) 40 MG tablet, Take 1 tablet (40 mg total) by mouth daily., Disp: 90 tablet, Rfl: 3   Tamsulosin HCl (FLOMAX) 0.4 MG CAPS, Take 0.4 mg by mouth in the morning and at bedtime., Disp: , Rfl:   Past Medical History: Past Medical History:  Diagnosis Date   CLL (chronic lymphocytic leukemia) ([place])    Coronary artery disease    Diverticulitis    Gout    Hypercholesteremia    Hypertension    Prostate disorder     Tobacco Use: Social History   Tobacco Use  Smoking Status Never  Smokeless Tobacco Never     Labs: Review Flowsheet       Latest Ref Rng & Units 07/31/2022 08/01/2022 10/08/2022  Labs for ITP Cardiac and Pulmonary Rehab  Cholestrol 100 - 199 mg/dL 166  - 156   LDL (calc) 0 - 99 mg/dL 99  - 101   HDL-C >39 mg/dL 45  - 35   Trlycerides 0 - 149 mg/dL 109  - 109   Hemoglobin A1c 4.8 - 5.6 % 5.7  - -  PH, Arterial 7.35 - 7.45 - 7.320  7.376  7.378  7.413  7.437  7.363  7.398  7.353  -  PCO2 arterial 32 - 48 mmHg - 38.6  37.1  37.5  34.1  37.5  43.4  43.5  44.0  -  Bicarbonate 20.0 - 28.0 mmol/L - 19.7  21.7  22.2  21.7  25.3  24.7  26.8  24.4  -  TCO2 22 - 32 mmol/L - '21  23  23  23  25  26  26  26  28  24  26  27  '$ -  Acid-base deficit 0.0 - 2.0 mmol/L - 6.0  3.0  3.0  2.0  1.0  1.0  -  O2 Saturation % - 92  95  94  99  100  87  100  100  -  Capillary Blood Glucose: Lab Results  Component Value Date   GLUCAP 104 (H) 08/05/2022   GLUCAP 98 08/05/2022   GLUCAP 119 (H) 08/05/2022   GLUCAP 137 (H) 08/04/2022   GLUCAP 115 (H) 08/04/2022     Exercise Target Goals: Exercise Program Goal: Individual exercise prescription set using results from initial 6 min walk test and THRR while considering  patient's activity barriers and safety.   Exercise Prescription Goal: Initial exercise prescription builds to 30-45 minutes a day of aerobic activity, 2-3 days per week.  Home exercise guidelines will be given to patient during program as part of exercise prescription that the participant will acknowledge.  Activity Barriers & Risk Stratification:  Activity Barriers & Cardiac Risk Stratification - 10/11/22 1506       Activity Barriers & Cardiac Risk Stratification   Activity Barriers Back Problems;Neck/Spine Problems;Incisional Pain;Decreased Ventricular Function    Cardiac Risk Stratification High   <5 METs on 6MWT            6 Minute Walk:  6 Minute Walk     Row Name 10/11/22 1505         6 Minute Walk   Phase Initial     Distance 1166 feet     Walk Time 6  minutes     # of Rest Breaks 0     MPH 2.2     METS 2.18     RPE 10     Perceived Dyspnea  0     VO2 Peak 7.64     Symptoms Yes (comment)     Comments 2/10 R hip pain. resolved with rest     Resting HR 53 bpm     Resting BP 122/54     Resting Oxygen Saturation  100 %     Exercise Oxygen Saturation  during 6 min walk 99 %     Max Ex. HR 69 bpm     Max Ex. BP 152/70     2 Minute Post BP 126/84              Oxygen Initial Assessment:   Oxygen Re-Evaluation:   Oxygen Discharge (Final Oxygen Re-Evaluation):   Initial Exercise Prescription:  Initial Exercise Prescription - 10/11/22 1500       Date of Initial Exercise RX and Referring Provider   Date 10/11/22    Referring Provider Modesto Charon, MD    Expected Discharge Date 12/07/22      Recumbant Bike   Level 1.5    RPM 60    Minutes 15    METs 2.2      NuStep   Level 1    SPM 80    Minutes 15    METs 2      Prescription Details   Frequency (times per week) 3    Duration Progress to 30 minutes of continuous aerobic without signs/symptoms of physical distress      Intensity   Ratings of Perceived Exertion 11-13      Progression   Progression Continue to progress workloads to maintain intensity without signs/symptoms of physical distress.      Resistance Training   Training Prescription Yes    Weight 3    Reps 10-15             Perform Capillary Blood Glucose checks as needed.  Exercise Prescription Changes:   Exercise Comments:   Exercise Goals and Review:   Exercise Goals     Row Name 10/11/22 1521  Exercise Goals   Increase Physical Activity Yes       Intervention Provide advice, education, support and counseling about physical activity/exercise needs.;Develop an individualized exercise prescription for aerobic and resistive training based on initial evaluation findings, risk stratification, comorbidities and participant's personal goals.       Expected  Outcomes Short Term: Attend rehab on a regular basis to increase amount of physical activity.;Long Term: Exercising regularly at least 3-5 days a week.;Long Term: Add in home exercise to make exercise part of routine and to increase amount of physical activity.       Increase Strength and Stamina Yes       Intervention Provide advice, education, support and counseling about physical activity/exercise needs.;Develop an individualized exercise prescription for aerobic and resistive training based on initial evaluation findings, risk stratification, comorbidities and participant's personal goals.       Expected Outcomes Short Term: Increase workloads from initial exercise prescription for resistance, speed, and METs.;Short Term: Perform resistance training exercises routinely during rehab and add in resistance training at home;Long Term: Improve cardiorespiratory fitness, muscular endurance and strength as measured by increased METs and functional capacity (6MWT)       Able to understand and use rate of perceived exertion (RPE) scale Yes       Intervention Provide education and explanation on how to use RPE scale       Expected Outcomes Short Term: Able to use RPE daily in rehab to express subjective intensity level;Long Term:  Able to use RPE to guide intensity level when exercising independently       Knowledge and understanding of Target Heart Rate Range (THRR) Yes       Intervention Provide education and explanation of THRR including how the numbers were predicted and where they are located for reference       Expected Outcomes Short Term: Able to state/look up THRR;Long Term: Able to use THRR to govern intensity when exercising independently;Short Term: Able to use daily as guideline for intensity in rehab       Understanding of Exercise Prescription Yes       Intervention Provide education, explanation, and written materials on patient's individual exercise prescription       Expected Outcomes Short  Term: Able to explain program exercise prescription;Long Term: Able to explain home exercise prescription to exercise independently                Exercise Goals Re-Evaluation :   Discharge Exercise Prescription (Final Exercise Prescription Changes):   Nutrition:  Target Goals: Understanding of nutrition guidelines, daily intake of sodium '1500mg'$ , cholesterol '200mg'$ , calories 30% from fat and 7% or less from saturated fats, daily to have 5 or more servings of fruits and vegetables.  Biometrics:  Pre Biometrics - 10/11/22 1500       Pre Biometrics   Waist Circumference 39 inches    Hip Circumference 37 inches    Waist to Hip Ratio 1.05 %    Triceps Skinfold 12 mm    % Body Fat 24.8 %    Grip Strength 36 kg    Flexibility 12.5 in    Single Leg Stand 8.56 seconds              Nutrition Therapy Plan and Nutrition Goals:   Nutrition Assessments:  MEDIFICTS Score Key: ?70 Need to make dietary changes  40-70 Heart Healthy Diet ? 40 Therapeutic Level Cholesterol Diet    Picture Your Plate Scores: <20 Unhealthy dietary pattern with  much room for improvement. 41-50 Dietary pattern unlikely to meet recommendations for good health and room for improvement. 51-60 More healthful dietary pattern, with some room for improvement.  >60 Healthy dietary pattern, although there may be some specific behaviors that could be improved.    Nutrition Goals Re-Evaluation:   Nutrition Goals Re-Evaluation:   Nutrition Goals Discharge (Final Nutrition Goals Re-Evaluation):   Psychosocial: Target Goals: Acknowledge presence or absence of significant depression and/or stress, maximize coping skills, provide positive support system. Participant is able to verbalize types and ability to use techniques and skills needed for reducing stress and depression.  Initial Review & Psychosocial Screening:  Initial Psych Review & Screening - 10/11/22 1557       Initial Review   Current  issues with Current Stress Concerns    Source of Stress Concerns Financial    Comments Linna Hoff reports having fatigue since his open heart surgery      Family Dynamics   Good Support System? Yes   [name] lives alone with his dog he has his next door neighbor for support his sister lives in [place]     Barriers   Psychosocial barriers to participate in program The patient should benefit from training in stress management and relaxation.      Screening Interventions   Interventions Encouraged to exercise;Provide feedback about the scores to participant;To provide support and resources with identified psychosocial needs    Expected Outcomes Long Term Goal: Stressors or current issues are controlled or eliminated.;Short Term goal: Identification and review with participant of any Quality of Life or Depression concerns found by scoring the questionnaire.;Long Term goal: The participant improves quality of Life and PHQ9 Scores as seen by post scores and/or verbalization of changes             Quality of Life Scores:  Quality of Life - 10/11/22 1522       Quality of Life   Select Quality of Life      Quality of Life Scores   Health/Function Pre 20.31 %    Socioeconomic Pre 21 %    Psych/Spiritual Pre 18.86 %    Family Pre 0 %   pt skipped all questions relating to family   GLOBAL Pre 20 %            Scores of 19 and below usually indicate a poorer quality of life in these areas.  A difference of  2-3 points is a clinically meaningful difference.  A difference of 2-3 points in the total score of the Quality of Life Index has been associated with significant improvement in overall quality of life, self-image, physical symptoms, and general health in studies assessing change in quality of life.  PHQ-9: Review Flowsheet       10/11/2022  Depression screen PHQ 2/9  Decreased Interest 0  Down, Depressed, Hopeless 0  PHQ - 2 Score 0  Altered sleeping 0  Tired, decreased energy 1  Change  in appetite 1  Feeling bad or failure about yourself  0  Trouble concentrating 0  Moving slowly or fidgety/restless 0  Suicidal thoughts 0  PHQ-9 Score 2  Difficult doing work/chores Somewhat difficult   Interpretation of Total Score  Total Score Depression Severity:  1-4 = Minimal depression, 5-9 = Mild depression, 10-14 = Moderate depression, 15-19 = Moderately severe depression, 20-27 = Severe depression   Psychosocial Evaluation and Intervention:   Psychosocial Re-Evaluation:   Psychosocial Discharge (Final Psychosocial Re-Evaluation):   Vocational Rehabilitation: Provide  vocational rehab assistance to qualifying candidates.   Vocational Rehab Evaluation & Intervention:  Vocational Rehab - 10/11/22 1600       Initial Vocational Rehab Evaluation & Intervention   Assessment shows need for Vocational Rehabilitation No   Linna Hoff is retired and does not need vocational rehab at this time            Education: Education Goals: Education classes will be provided on a weekly basis, covering required topics. Participant will state understanding/return demonstration of topics presented.     Core Videos: Exercise    Move It!  Clinical staff conducted group or individual video education with verbal and written material and guidebook.  Patient learns the recommended Pritikin exercise program. Exercise with the goal of living a long, healthy life. Some of the health benefits of exercise include controlled diabetes, healthier blood pressure levels, improved cholesterol levels, improved heart and lung capacity, improved sleep, and better body composition. Everyone should speak with their doctor before starting or changing an exercise routine.  Biomechanical Limitations Clinical staff conducted group or individual video education with verbal and written material and guidebook.  Patient learns how biomechanical limitations can impact exercise and how we can mitigate and possibly  overcome limitations to have an impactful and balanced exercise routine.  Body Composition Clinical staff conducted group or individual video education with verbal and written material and guidebook.  Patient learns that body composition (ratio of muscle mass to fat mass) is a key component to assessing overall fitness, rather than body weight alone. Increased fat mass, especially visceral belly fat, can put [place] at increased risk for metabolic syndrome, type 2 diabetes, heart disease, and even death. It is recommended to combine diet and exercise (cardiovascular and resistance training) to improve your body composition. Seek guidance from your physician and exercise physiologist before implementing an exercise routine.  Exercise Action Plan Clinical staff conducted group or individual video education with verbal and written material and guidebook.  Patient learns the recommended strategies to achieve and enjoy long-term exercise adherence, including variety, self-motivation, self-efficacy, and positive decision making. Benefits of exercise include fitness, good health, weight management, more energy, better sleep, less stress, and overall well-being.  Medical   Heart Disease Risk Reduction Clinical staff conducted group or individual video education with verbal and written material and guidebook.  Patient learns our heart is our most vital organ as it circulates oxygen, nutrients, white blood cells, and hormones throughout the entire body, and carries waste away. Data supports a plant-based eating plan like the Pritikin Program for its effectiveness in slowing progression of and reversing heart disease. The video provides a number of recommendations to address heart disease.   Metabolic Syndrome and Belly Fat  Clinical staff conducted group or individual video education with verbal and written material and guidebook.  Patient learns what metabolic syndrome is, how it leads to heart disease, and how  one can reverse it and keep it from coming back. You have metabolic syndrome if you have 3 of the following 5 criteria: abdominal obesity, high blood pressure, high triglycerides, low HDL cholesterol, and high blood sugar.  Hypertension and Heart Disease Clinical staff conducted group or individual video education with verbal and written material and guidebook.  Patient learns that high blood pressure, or hypertension, is very common in the [place]. Hypertension is largely due to excessive salt intake, but other important risk factors include being overweight, physical inactivity, drinking too much alcohol, smoking, and not eating enough potassium from  fruits and vegetables. High blood pressure is a leading risk factor for heart attack, stroke, congestive heart failure, dementia, kidney failure, and premature death. Long-term effects of excessive salt intake include stiffening of the arteries and thickening of heart muscle and organ damage. Recommendations include ways to reduce hypertension and the risk of heart disease.  Diseases of Our Time - Focusing on Diabetes Clinical staff conducted group or individual video education with verbal and written material and guidebook.  Patient learns why the best way to stop diseases of our time is prevention, through food and other lifestyle changes. Medicine (such as prescription pills and surgeries) is often only a Band-Aid on the problem, not a long-term solution. Most common diseases of our time include obesity, type 2 diabetes, hypertension, heart disease, and cancer. The Pritikin Program is recommended and has been proven to help reduce, reverse, and/or prevent the damaging effects of metabolic syndrome.  Nutrition   Overview of the Pritikin Eating Plan  Clinical staff conducted group or individual video education with verbal and written material and guidebook.  Patient learns about the [place] for disease risk reduction. The [place] emphasizes a wide variety of unrefined, minimally-processed carbohydrates, like fruits, vegetables, whole grains, and legumes. Go, Caution, and Stop food choices are explained. Plant-based and lean animal proteins are emphasized. Rationale provided for low sodium intake for blood pressure control, low added sugars for blood sugar stabilization, and low added fats and oils for coronary artery disease risk reduction and weight management.  Calorie Density  Clinical staff conducted group or individual video education with verbal and written material and guidebook.  Patient learns about calorie density and how it impacts the Pritikin Eating Plan. Knowing the characteristics of the food you choose will help you decide whether those foods will lead to weight gain or weight loss, and whether you want to consume more or less of them. Weight loss is usually a side effect of the Pritikin Eating Plan because of its focus on low calorie-dense foods.  Label Reading  Clinical staff conducted group or individual video education with verbal and written material and guidebook.  Patient learns about the Pritikin recommended label reading guidelines and corresponding recommendations regarding calorie density, added sugars, sodium content, and whole grains.  Dining Out - Part 1  Clinical staff conducted group or individual video education with verbal and written material and guidebook.  Patient learns that restaurant meals can be sabotaging because they can be so high in calories, fat, sodium, and/or sugar. Patient learns recommended strategies on how to positively address this and avoid unhealthy pitfalls.  Facts on Fats  Clinical staff conducted group or individual video education with verbal and written material and guidebook.  Patient learns that lifestyle modifications can be just as effective, if not more so, as many medications for lowering your risk of heart disease. A Pritikin lifestyle can help to  reduce your risk of inflammation and atherosclerosis (cholesterol build-up, or plaque, in the artery walls). Lifestyle interventions such as dietary choices and physical activity address the cause of atherosclerosis. A review of the types of fats and their impact on blood cholesterol levels, along with dietary recommendations to reduce fat intake is also included.  Nutrition Action Plan  Clinical staff conducted group or individual video education with verbal and written material and guidebook.  Patient learns how to incorporate Pritikin recommendations into their lifestyle. Recommendations include planning and keeping personal health goals in mind as an important part of their  success.  Healthy Mind-Set    Healthy Minds, Bodies, Hearts  Clinical staff conducted group or individual video education with verbal and written material and guidebook.  Patient learns how to identify when they are stressed. Video will discuss the impact of that stress, as well as the many benefits of stress management. Patient will also be introduced to stress management techniques. The way we think, act, and feel has an impact on our hearts.  How Our Thoughts Can Heal Our Hearts  Clinical staff conducted group or individual video education with verbal and written material and guidebook.  Patient learns that negative thoughts can cause depression and anxiety. This can result in negative lifestyle behavior and serious health problems. Cognitive behavioral therapy is an effective method to help control our thoughts in order to change and improve our emotional outlook.  Additional Videos:  Exercise    Improving Performance  Clinical staff conducted group or individual video education with verbal and written material and guidebook.  Patient learns to use a non-linear approach by alternating intensity levels and lengths of time spent exercising to help burn more calories and lose more body fat. Cardiovascular exercise helps  improve heart health, metabolism, hormonal balance, blood sugar control, and recovery from fatigue. Resistance training improves strength, endurance, balance, coordination, reaction time, metabolism, and muscle mass. Flexibility exercise improves circulation, posture, and balance. Seek guidance from your physician and exercise physiologist before implementing an exercise routine and learn your capabilities and proper form for all exercise.  Introduction to Yoga  Clinical staff conducted group or individual video education with verbal and written material and guidebook.  Patient learns about yoga, a discipline of the coming together of mind, breath, and body. The benefits of yoga include improved flexibility, improved range of motion, better posture and core strength, increased lung function, weight loss, and positive self-image. Yoga's heart health benefits include lowered blood pressure, healthier heart rate, decreased cholesterol and triglyceride levels, improved immune function, and reduced stress. Seek guidance from your physician and exercise physiologist before implementing an exercise routine and learn your capabilities and proper form for all exercise.  Medical   Aging: Enhancing Your Quality of Life  Clinical staff conducted group or individual video education with verbal and written material and guidebook.  Patient learns key strategies and recommendations to stay in good physical health and enhance quality of life, such as prevention strategies, having an advocate, securing a [place], and keeping a list of medications and system for tracking them. It also discusses how to avoid risk for bone loss.  Biology of Weight Control  Clinical staff conducted group or individual video education with verbal and written material and guidebook.  Patient learns that weight gain occurs because we consume more calories than we burn (eating more, moving less). Even if your body  weight is normal, you may have higher ratios of fat compared to muscle mass. Too much body fat puts you at increased risk for cardiovascular disease, heart attack, stroke, type 2 diabetes, and obesity-related cancers. In addition to exercise, following the [place] can help reduce your risk.  Decoding Lab Results  Clinical staff conducted group or individual video education with verbal and written material and guidebook.  Patient learns that lab test reflects one measurement whose values change over time and are influenced by many factors, including medication, stress, sleep, exercise, food, hydration, pre-existing medical conditions, and more. It is recommended to use the knowledge from this video to  become more involved with your lab results and evaluate your numbers to speak with your doctor.   Diseases of Our Time - Overview  Clinical staff conducted group or individual video education with verbal and written material and guidebook.  Patient learns that according to the CDC, 50% to 70% of chronic diseases (such as obesity, type 2 diabetes, elevated lipids, hypertension, and heart disease) are avoidable through lifestyle improvements including healthier food choices, listening to satiety cues, and increased physical activity.  Sleep Disorders Clinical staff conducted group or individual video education with verbal and written material and guidebook.  Patient learns how good quality and duration of sleep are important to overall health and well-being. Patient also learns about sleep disorders and how they impact health along with recommendations to address them, including discussing with a physician.  Nutrition  Dining Out - Part 2 Clinical staff conducted group or individual video education with verbal and written material and guidebook.  Patient learns how to plan ahead and communicate in order to maximize their dining experience in a healthy and nutritious manner. Included are  recommended food choices based on the type of restaurant the patient is visiting.   Fueling a Best boy conducted group or individual video education with verbal and written material and guidebook.  There is a strong connection between our food choices and our health. Diseases like obesity and type 2 diabetes are very prevalent and are in large-part due to lifestyle choices. The Pritikin Eating Plan provides plenty of food and hunger-curbing satisfaction. It is easy to follow, affordable, and helps reduce health risks.  Menu Workshop  Clinical staff conducted group or individual video education with verbal and written material and guidebook.  Patient learns that restaurant meals can sabotage health goals because they are often packed with calories, fat, sodium, and sugar. Recommendations include strategies to plan ahead and to communicate with the manager, chef, or server to help order a healthier meal.  Planning Your Eating Strategy  Clinical staff conducted group or individual video education with verbal and written material and guidebook.  Patient learns about the [place] and its benefit of reducing the risk of disease. The [place] does not focus on calories. Instead, it emphasizes high-quality, nutrient-rich foods. By knowing the characteristics of the foods, we choose, we can determine their calorie density and make informed decisions.  Targeting Your Nutrition Priorities  Clinical staff conducted group or individual video education with verbal and written material and guidebook.  Patient learns that lifestyle habits have a tremendous impact on disease risk and progression. This video provides eating and physical activity recommendations based on your personal health goals, such as reducing LDL cholesterol, losing weight, preventing or controlling type 2 diabetes, and reducing high blood pressure.  Vitamins and Minerals  Clinical staff conducted  group or individual video education with verbal and written material and guidebook.  Patient learns different ways to obtain key vitamins and minerals, including through a recommended healthy diet. It is important to discuss all supplements you take with your doctor.   Healthy Mind-Set    Smoking Cessation  Clinical staff conducted group or individual video education with verbal and written material and guidebook.  Patient learns that cigarette smoking and tobacco addiction pose a serious health risk which affects millions of people. Stopping smoking will significantly reduce the risk of heart disease, lung disease, and many forms of cancer. Recommended strategies for quitting are covered, including working with your doctor to  develop a successful plan.  Culinary   Becoming a Financial trader conducted group or individual video education with verbal and written material and guidebook.  Patient learns that cooking at home can be healthy, cost-effective, quick, and puts them in control. Keys to cooking healthy recipes will include looking at your recipe, assessing your equipment needs, planning ahead, making it simple, choosing cost-effective seasonal ingredients, and limiting the use of added fats, salts, and sugars.  Cooking - Breakfast and Snacks  Clinical staff conducted group or individual video education with verbal and written material and guidebook.  Patient learns how important breakfast is to satiety and nutrition through the entire day. Recommendations include key foods to eat during breakfast to help stabilize blood sugar levels and to prevent overeating at meals later in the day. Planning ahead is also a key component.  Cooking - Human resources officer conducted group or individual video education with verbal and written material and guidebook.  Patient learns eating strategies to improve overall health, including an approach to cook more at home. Recommendations  include thinking of animal protein as a side on your plate rather than center stage and focusing instead on lower calorie dense options like vegetables, fruits, whole grains, and plant-based proteins, such as beans. Making sauces in large quantities to freeze for later and leaving the skin on your vegetables are also recommended to maximize your experience.  Cooking - Healthy Salads and Dressing Clinical staff conducted group or individual video education with verbal and written material and guidebook.  Patient learns that vegetables, fruits, whole grains, and legumes are the foundations of the [place]. Recommendations include how to incorporate each of these in flavorful and healthy salads, and how to create homemade salad dressings. Proper handling of ingredients is also covered. Cooking - Soups and [employer] - Soups and Desserts Clinical staff conducted group or individual video education with verbal and written material and guidebook.  Patient learns that Pritikin soups and desserts make for easy, nutritious, and delicious snacks and meal components that are low in sodium, fat, sugar, and calorie density, while high in vitamins, minerals, and filling fiber. Recommendations include simple and healthy ideas for soups and desserts.   Overview     The Pritikin Solution Program Overview Clinical staff conducted group or individual video education with verbal and written material and guidebook.  Patient learns that the results of the [place] Program have been documented in more than 100 articles published in peer-reviewed journals, and the benefits include reducing risk factors for (and, in some cases, even reversing) high cholesterol, high blood pressure, type 2 diabetes, obesity, and more! An overview of the three key pillars of the Pritikin Program will be covered: eating well, doing regular exercise, and having a healthy mind-set.  WORKSHOPS  Exercise: Exercise Basics:  Building Your Action Plan Clinical staff led group instruction and group discussion with PowerPoint presentation and patient guidebook. To enhance the learning environment the use of posters, models and videos may be added. At the conclusion of this workshop, patients will comprehend the difference between physical activity and exercise, as well as the benefits of incorporating both, into their routine. Patients will understand the FITT (Frequency, Intensity, Time, and Type) principle and how to use it to build an exercise action plan. In addition, safety concerns and other considerations for exercise and cardiac rehab will be addressed by the presenter. The purpose of this lesson is to promote a comprehensive and  effective weekly exercise routine in order to improve patients' overall level of fitness.   Managing Heart Disease: Your Path to a Healthier Heart Clinical staff led group instruction and group discussion with PowerPoint presentation and patient guidebook. To enhance the learning environment the use of posters, models and videos may be added.At the conclusion of this workshop, patients will understand the anatomy and physiology of the heart. Additionally, they will understand how Pritikin's three pillars impact the risk factors, the progression, and the management of heart disease.  The purpose of this lesson is to provide a high-level overview of the heart, heart disease, and how the Pritikin lifestyle positively impacts risk factors.  Exercise Biomechanics Clinical staff led group instruction and group discussion with PowerPoint presentation and patient guidebook. To enhance the learning environment the use of posters, models and videos may be added. Patients will learn how the structural parts of their bodies function and how these functions impact their daily activities, movement, and exercise. Patients will learn how to promote a neutral spine, learn how to manage pain, and identify  ways to improve their physical movement in order to promote healthy living. The purpose of this lesson is to expose patients to common physical limitations that impact physical activity. Participants will learn practical ways to adapt and manage aches and pains, and to minimize their effect on regular exercise. Patients will learn how to maintain good posture while sitting, walking, and lifting.  Balance Training and Fall Prevention  Clinical staff led group instruction and group discussion with PowerPoint presentation and patient guidebook. To enhance the learning environment the use of posters, models and videos may be added. At the conclusion of this workshop, patients will understand the importance of their sensorimotor skills (vision, proprioception, and the vestibular system) in maintaining their ability to balance as they age. Patients will apply a variety of balancing exercises that are appropriate for their current level of function. Patients will understand the common causes for poor balance, possible solutions to these problems, and ways to modify their physical environment in order to minimize their fall risk. The purpose of this lesson is to teach patients about the importance of maintaining balance as they age and ways to minimize their risk of falling.  WORKSHOPS   Nutrition:  Fueling a Scientist, research (physical sciences) led group instruction and group discussion with PowerPoint presentation and patient guidebook. To enhance the learning environment the use of posters, models and videos may be added. Patients will review the foundational principles of the [place] and understand what constitutes a serving size in each of the food groups. Patients will also learn Pritikin-friendly foods that are better choices when away from home and review make-ahead meal and snack options. Calorie density will be reviewed and applied to three nutrition priorities: weight maintenance, weight  loss, and weight gain. The purpose of this lesson is to reinforce (in a group setting) the key concepts around what patients are recommended to eat and how to apply these guidelines when away from home by planning and selecting Pritikin-friendly options. Patients will understand how calorie density may be adjusted for different weight management goals.  Mindful Eating  Clinical staff led group instruction and group discussion with PowerPoint presentation and patient guidebook. To enhance the learning environment the use of posters, models and videos may be added. Patients will briefly review the concepts of the [place] and the importance of low-calorie dense foods. The concept of mindful eating will be introduced as well  as the importance of paying attention to internal hunger signals. Triggers for non-hunger eating and techniques for dealing with triggers will be explored. The purpose of this lesson is to provide patients with the opportunity to review the basic principles of the [place], discuss the value of eating mindfully and how to measure internal cues of hunger and fullness using the Hunger Scale. Patients will also discuss reasons for non-hunger eating and learn strategies to use for controlling emotional eating.  Targeting Your Nutrition Priorities Clinical staff led group instruction and group discussion with PowerPoint presentation and patient guidebook. To enhance the learning environment the use of posters, models and videos may be added. Patients will learn how to determine their genetic susceptibility to disease by reviewing their family history. Patients will gain insight into the importance of diet as part of an overall healthy lifestyle in mitigating the impact of genetics and other environmental insults. The purpose of this lesson is to provide patients with the opportunity to assess their personal nutrition priorities by looking at their family history, their own  health history and current risk factors. Patients will also be able to discuss ways of prioritizing and modifying the [place] for their highest risk areas  Menu  Clinical staff led group instruction and group discussion with PowerPoint presentation and patient guidebook. To enhance the learning environment the use of posters, models and videos may be added. Using menus brought in from [employer], or printed from [employer], patients will apply the [place] dining out guidelines that were presented in the [employer] video. Patients will also be able to practice these guidelines in a variety of provided scenarios. The purpose of this lesson is to provide patients with the opportunity to practice hands-on learning of the [place] with actual menus and practice scenarios.  Label Reading Clinical staff led group instruction and group discussion with PowerPoint presentation and patient guidebook. To enhance the learning environment the use of posters, models and videos may be added. Patients will review and discuss the Pritikin label reading guidelines presented in Pritikin's Label Reading Educational series video. Using fool labels brought in from local grocery stores and markets, patients will apply the label reading guidelines and determine if the packaged food meet the Pritikin guidelines. The purpose of this lesson is to provide patients with the opportunity to review, discuss, and practice hands-on learning of the Pritikin Label Reading guidelines with actual packaged food labels. [place] Workshops are designed to teach patients ways to prepare quick, simple, and affordable recipes at home. The importance of nutrition's role in chronic disease risk reduction is reflected in its emphasis in the overall Pritikin program. By learning how to prepare essential core Pritikin Eating Plan recipes, patients will  increase control over what they eat; be able to customize the flavor of foods without the use of added salt, sugar, or fat; and improve the quality of the food they consume. By learning a set of core recipes which are easily assembled, quickly prepared, and affordable, patients are more likely to prepare more healthy foods at home. These workshops focus on convenient breakfasts, simple entres, side dishes, and desserts which can be prepared with minimal effort and are consistent with nutrition recommendations for cardiovascular risk reduction. Cooking [employer] are taught by a Engineer, materials (RD) who has been trained by the [employer]. The chef or RD has a clear understanding of the  importance of minimizing - if not completely eliminating - added fat, sugar, and sodium in recipes. Throughout the series of [place] Workshop sessions, patients will learn about healthy ingredients and efficient methods of cooking to build confidence in their capability to prepare    Cooking School weekly topics:  Adding Flavor- Sodium-Free  Fast and Healthy Breakfasts  Powerhouse Plant-Based Proteins  Satisfying Salads and Dressings  Simple Sides and Sauces  International Cuisine-Spotlight on the [place] Zones  Delicious Desserts  Savory Soups  Efficiency Cooking - Meals in a Snap  Tasty Appetizers and Snacks  Comforting Weekend Breakfasts  One-Pot Wonders   Fast Evening Meals  Easy [place] (Psychosocial): New Thoughts, New Behaviors Clinical staff led group instruction and group discussion with PowerPoint presentation and patient guidebook. To enhance the learning environment the use of posters, models and videos may be added. Patients will learn and practice techniques for developing effective health and lifestyle goals. Patients will be able to effectively apply the goal setting process learned to  develop at least one new personal goal.  The purpose of this lesson is to expose patients to a new skill set of behavior modification techniques such as techniques setting SMART goals, overcoming barriers, and achieving new thoughts and new behaviors.  Managing Moods and Relationships Clinical staff led group instruction and group discussion with PowerPoint presentation and patient guidebook. To enhance the learning environment the use of posters, models and videos may be added. Patients will learn how emotional and chronic stress factors can impact their health and relationships. They will learn healthy ways to manage their moods and utilize positive coping mechanisms. In addition, ICR patients will learn ways to improve communication skills. The purpose of this lesson is to expose patients to ways of understanding how one's mood and health are intimately connected. Developing a healthy outlook can help build positive relationships and connections with others. Patients will understand the importance of utilizing effective communication skills that include actively listening and being heard. They will learn and understand the importance of the "4 Cs" and especially Connections in fostering of a Healthy Mind-Set.  Healthy Sleep for a Healthy Heart Clinical staff led group instruction and group discussion with PowerPoint presentation and patient guidebook. To enhance the learning environment the use of posters, models and videos may be added. At the conclusion of this workshop, patients will be able to demonstrate knowledge of the importance of sleep to overall health, well-being, and quality of life. They will understand the symptoms of, and treatments for, common sleep disorders. Patients will also be able to identify daytime and nighttime behaviors which impact sleep, and they will be able to apply these tools to help manage sleep-related challenges. The purpose of this lesson is to provide patients with a  general overview of sleep and outline the importance of quality sleep. Patients will learn about a few of the most common sleep disorders. Patients will also be introduced to the concept of "sleep hygiene," and discover ways to self-manage certain sleeping problems through simple daily behavior changes. Finally, the workshop will motivate patients by clarifying the links between quality sleep and their goals of heart-healthy living.   Recognizing and Reducing Stress Clinical staff led group instruction and group discussion with PowerPoint presentation and patient guidebook. To enhance the learning environment the use of posters, models and videos may be added. At the conclusion of this workshop, patients will be able to understand the  types of stress reactions, differentiate between acute and chronic stress, and recognize the impact that chronic stress has on their health. They will also be able to apply different coping mechanisms, such as reframing negative self-talk. Patients will have the opportunity to practice a variety of stress management techniques, such as deep abdominal breathing, progressive muscle relaxation, and/or guided imagery.  The purpose of this lesson is to educate patients on the role of stress in their lives and to provide healthy techniques for coping with it.  Learning Barriers/Preferences:  Learning Barriers/Preferences - 10/11/22 1523       Learning Barriers/Preferences   Learning Barriers Sight;Hearing   wears glasses, does not wear hearing aids   Learning Preferences Computer/Internet;Group Instruction;Individual Instruction;Pictoral;Skilled Demonstration;Verbal Instruction;Written Material             Education Topics:  Knowledge Questionnaire Score:  Knowledge Questionnaire Score - 10/11/22 1524       Knowledge Questionnaire Score   Pre Score 19/24             Core Components/Risk Factors/Patient Goals at Admission:  Personal Goals and Risk Factors at  Admission - 10/11/22 1526       Core Components/Risk Factors/Patient Goals on Admission    Weight Management Yes;Weight Maintenance    Intervention Weight Management: Develop a combined nutrition and exercise program designed to reach desired caloric intake, while maintaining appropriate intake of nutrient and fiber, sodium and fats, and appropriate energy expenditure required for the weight goal.;Weight Management: Provide education and appropriate resources to help participant work on and attain dietary goals.    Expected Outcomes Weight Maintenance: Understanding of the daily nutrition guidelines, which includes 25-35% calories from fat, 7% or less cal from saturated fats, less than '200mg'$  cholesterol, less than 1.5gm of sodium, & 5 or more servings of fruits and vegetables daily;Short Term: Continue to assess and modify interventions until short term weight is achieved;Long Term: Adherence to nutrition and physical activity/exercise program aimed toward attainment of established weight goal    Hypertension Yes    Intervention Provide education on lifestyle modifcations including regular physical activity/exercise, weight management, moderate sodium restriction and increased consumption of fresh fruit, vegetables, and low fat dairy, alcohol moderation, and smoking cessation.;Monitor prescription use compliance.    Expected Outcomes Long Term: Maintenance of blood pressure at goal levels.;Short Term: Continued assessment and intervention until BP is < 140/75m HG in hypertensive participants. < 130/828mHG in hypertensive participants with diabetes, heart failure or chronic kidney disease.    Lipids Yes    Intervention Provide education and support for participant on nutrition & aerobic/resistive exercise along with prescribed medications to achieve LDL '70mg'$ , HDL >'40mg'$ .    Expected Outcomes Short Term: Participant states understanding of desired cholesterol values and is compliant with medications  prescribed. Participant is following exercise prescription and nutrition guidelines.;Long Term: Cholesterol controlled with medications as prescribed, with individualized exercise RX and with personalized nutrition plan. Value goals: LDL < '70mg'$ , HDL > 40 mg.    Stress Yes    Intervention Offer individual and/or small group education and counseling on adjustment to heart disease, stress management and health-related lifestyle change. Teach and support self-help strategies.;Refer participants experiencing significant psychosocial distress to appropriate mental health specialists for further evaluation and treatment. When possible, include family members and significant others in education/counseling sessions.    Expected Outcomes Short Term: Participant demonstrates changes in health-related behavior, relaxation and other stress management skills, ability to obtain effective social support, and compliance with psychotropic medications  if prescribed.;Long Term: Emotional wellbeing is indicated by absence of clinically significant psychosocial distress or social isolation.             Core Components/Risk Factors/Patient Goals Review:    Core Components/Risk Factors/Patient Goals at Discharge (Final Review):    ITP Comments:  ITP Comments     Row Name 10/11/22 1312           ITP Comments Dr. Fransico Him medical director. Introduction to pritikin education program/intensive cardiac rehab. Initital orientation packet reviewed with patient.                Comments: Participant attended orientation for the cardiac rehabilitation program on  10/11/2022  to perform initial intake and exercise walk test. Patient introduced to the [place] education and orientation packet was reviewed. Completed 6-minute walk test, measurements, initial ITP, and exercise prescription. Vital signs stable. Telemetry-normal sinus brady, sinus rhythm, Reported mild right hip pain. Resolved with rest.Binnie Droessler  Miguel Rota RN BSN   Service time was from 1250 to 1446.

## 2022-10-11 NOTE — Progress Notes (Signed)
Cardiac Rehab Medication Review by a Nurse  Does the patient  feel that his/her medications are working for him/her?  yes  Has the patient been experiencing any side effects to the medications prescribed?  no  Does the patient measure his/her own blood pressure or blood glucose at home?  yes   Does the patient have any problems obtaining medications due to transportation or finances?   no  Understanding of regimen: good Understanding of indications: good Potential of compliance: good    Nurse comments: Corey [name] is taking his medications as prescribed and has a good understanding of what his medications are for. [name] checks his blood pressures 3-4 times a week.    [name] [name] RN 10/11/2022 4:16 PM

## 2022-10-15 ENCOUNTER — Encounter (HOSPITAL_COMMUNITY)
Admission: RE | Admit: 2022-10-15 | Discharge: 2022-10-15 | Disposition: A | Discharge status: Home / Self Care | Payer: Medicare Other | Source: Ambulatory Visit | Attending: Cardiovascular Disease | Admitting: Cardiovascular Disease

## 2022-10-15 DIAGNOSIS — Z951 Presence of aortocoronary bypass graft: Secondary | ICD-10-CM

## 2022-10-15 NOTE — Telephone Encounter (Signed)
Pt viewed results / recommendations via mychart.

## 2022-10-15 NOTE — Progress Notes (Signed)
Daily Session Note  Patient Details  Name: Corey [name] MRN: [id number] Date of Birth: 1941-04-18 Referring Provider:   Flowsheet Row INTENSIVE CARDIAC REHAB ORIENT from 10/11/2022 in [facility] [place]  Referring Provider Modesto Charon, MD       Encounter Date: 10/15/2022  Check In:  Session Check In - 10/15/22 1524       Check-In   Supervising physician immediately available to respond to emergencies [facility] MD immediately available    Physician(s) Diona Browner, DNP    Location [facility]    Staff Present Barnet Pall, RN, Luisa Hart, RN, Milus Glazier, MS, ACSM-CEP, CCRP, Exercise Physiologist;Jetta Gilford Rile BS, ACSM-CEP, Exercise Physiologist;Randi Olen Cordial BS, ACSM-CEP, Exercise Physiologist;Other    Virtual Visit No    Medication changes reported     No    Fall or balance concerns reported    No    Tobacco Cessation No Change    Warm-up and Cool-down Performed as group-led instruction   CRP2 orientation   Resistance Training Performed Yes    VAD Patient? No    PAD/SET Patient? No      Pain Assessment   Currently in Pain? No/denies    Pain Score 0-No pain    Multiple Pain Sites No             Capillary Blood Glucose: No results found for this or any previous visit (from the past 24 hour(s)).   Exercise Prescription Changes - 10/15/22 1628       Response to Exercise   Blood Pressure (Admit) 124/78    Blood Pressure (Exercise) 134/68    Blood Pressure (Exit) 130/70    Heart Rate (Admit) 52 bpm    Heart Rate (Exercise) 75 bpm    Heart Rate (Exit) 57 bpm    Rating of Perceived Exertion (Exercise) 9.5    Perceived Dyspnea (Exercise) 0    Symptoms none    Comments Pt first day in the [facility] program    Duration Progress to 30 minutes of  aerobic without signs/symptoms of physical distress    Intensity THRR unchanged      Progression   Progression Continue to progress  workloads to maintain intensity without signs/symptoms of physical distress.    Average METs 1.9      Resistance Training   Training Prescription Yes    Weight 3    Reps 10-15    Time 10 Minutes      Recumbant Bike   Level 1.5    RPM 60    Minutes 15    METs 1.7      NuStep   Level 1    SPM 78    Minutes 15    METs 2.1             Social History   Tobacco Use  Smoking Status Never  Smokeless Tobacco Never    Goals Met:  Exercise tolerated well No report of concerns or symptoms today Strength training completed today  Goals Unmet:  Not Applicable  Comments: Pt started cardiac rehab today.  Pt tolerated light exercise without difficulty. VSS, telemetry-sinus brady, Sinus Rhythm, asymptomatic.  Medication list reconciled. Pt denies barriers to medicaiton compliance.  PSYCHOSOCIAL ASSESSMENT:  PHQ-2. Will review quality of life questionnaire with the patient in the upcoming week. Patient says he does not have any family in the area he has neighbors he can rely on .    Pt  enjoys painting and reading history.   Pt oriented to exercise equipment and routine.    Understanding verbalized.Harrell Gave RN BSN    Dr. Fransico Him is Medical Director for Cardiac Rehab at [facility].

## 2022-10-17 ENCOUNTER — Encounter (HOSPITAL_COMMUNITY)
Admission: RE | Admit: 2022-10-17 | Discharge: 2022-10-17 | Disposition: A | Discharge status: Home / Self Care | Payer: Medicare Other | Source: Ambulatory Visit | Attending: Cardiovascular Disease | Admitting: Cardiovascular Disease

## 2022-10-17 DIAGNOSIS — Z951 Presence of aortocoronary bypass graft: Secondary | ICD-10-CM

## 2022-10-19 ENCOUNTER — Encounter (HOSPITAL_COMMUNITY)
Admission: RE | Admit: 2022-10-19 | Discharge: 2022-10-19 | Disposition: A | Discharge status: Home / Self Care | Payer: Medicare Other | Source: Ambulatory Visit | Attending: Cardiovascular Disease | Admitting: Cardiovascular Disease

## 2022-10-19 DIAGNOSIS — Z951 Presence of aortocoronary bypass graft: Secondary | ICD-10-CM | POA: Diagnosis not present

## 2022-10-19 NOTE — Progress Notes (Signed)
QUALITY OF LIFE SCORE REVIEW  Pt completed Quality of Life survey as a participant in Cardiac Rehab.  Scores 21.0 or below are considered low.  Pt score very low in several areas Overall 20.00, Health and Function 21.0, socioeconomic 21.00, physiological and spiritual 18.86, family 0. Patient quality of life slightly altered by physical constraints which limits ability to perform as prior to recent cardiac illness. Corey [name] did not fill out the family section of his quality of life questionnaire. Corey [name] has has acquaintances and says the only person he has to rely on is himself. Corey [name] says he does not feel alone and denies being depressed. Offered emotional support and reassurance.Corey [name] does not want this survey forwarded to his primary care provider. Will continue to monitor and intervene as necessary.Harrell Gave RN BSN

## 2022-10-22 ENCOUNTER — Encounter (HOSPITAL_COMMUNITY)
Admission: RE | Admit: 2022-10-22 | Discharge: 2022-10-22 | Disposition: A | Discharge status: Home / Self Care | Payer: Medicare Other | Source: Ambulatory Visit | Attending: Cardiovascular Disease | Admitting: Cardiovascular Disease

## 2022-10-22 DIAGNOSIS — Z951 Presence of aortocoronary bypass graft: Secondary | ICD-10-CM | POA: Diagnosis not present

## 2022-10-24 ENCOUNTER — Encounter (HOSPITAL_COMMUNITY)
Admission: RE | Admit: 2022-10-24 | Discharge: 2022-10-24 | Disposition: A | Discharge status: Home / Self Care | Payer: Medicare Other | Source: Ambulatory Visit | Attending: Cardiovascular Disease | Admitting: Cardiovascular Disease

## 2022-10-24 DIAGNOSIS — Z951 Presence of aortocoronary bypass graft: Secondary | ICD-10-CM

## 2022-10-24 NOTE — Progress Notes (Signed)
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  Patient's Home Medications on Admission:  Current Outpatient Medications:    allopurinol (ZYLOPRIM) 300 MG tablet, Take 300 mg by mouth daily., Disp: , Rfl:    aspirin EC 81 MG tablet, Take 1 tablet (81 mg total) by mouth daily., Disp: , Rfl:    clopidogrel (PLAVIX) 75 MG tablet, Take 1 tablet (75 mg total) by mouth daily., Disp: 30 tablet, Rfl: 3   Cyanocobalamin (VITAMIN B 12) 500 MCG TABS, Take 1 tablet by mouth daily., Disp: , Rfl:    finasteride (PROSCAR) 5 MG tablet, Take 5 mg by mouth daily., Disp: , Rfl:    furosemide (LASIX) 20 MG tablet, Take 1 tablet (20 mg total) by mouth daily., Disp: 5 tablet, Rfl: 0   metoprolol succinate (TOPROL XL) 25 MG 24 hr tablet, Take 1 tablet (25 mg total) by mouth daily., Disp: 30 tablet, Rfl: 5   omeprazole (PRILOSEC) 40 MG capsule, Take 40 mg by mouth daily., Disp: , Rfl:    rosuvastatin (CRESTOR) 40 MG tablet, Take 1 tablet (40 mg total) by mouth daily., Disp: 90 tablet, Rfl: 3   Tamsulosin HCl (FLOMAX) 0.4 MG CAPS, Take 0.4 mg by mouth in the morning and at bedtime., Disp: , Rfl:   Past Medical History: Past Medical History:  Diagnosis Date   CLL (chronic lymphocytic leukemia) ([place])    Coronary artery disease    Diverticulitis    Gout    Hypercholesteremia    Hypertension    Prostate disorder     Tobacco Use: Social History   Tobacco Use  Smoking Status Never  Smokeless Tobacco Never     Labs: Review Flowsheet       Latest Ref Rng & Units 07/31/2022 08/01/2022 10/08/2022  Labs for ITP Cardiac and Pulmonary Rehab  Cholestrol 100 - 199 mg/dL 166  - 156   LDL (calc) 0 - 99 mg/dL 99  - 101   HDL-C >39 mg/dL 45  - 35   Trlycerides 0 - 149 mg/dL 109  - 109   Hemoglobin A1c 4.8 - 5.6 % 5.7  - -  PH, Arterial 7.35 - 7.45 - 7.320  7.376  7.378  7.413  7.437  7.363  7.398  7.353  -  PCO2 arterial 32 - 48 mmHg - 38.6  37.1  37.5  34.1  37.5  43.4  43.5  44.0  -  Bicarbonate 20.0 - 28.0 mmol/L - 19.7  21.7  22.2  21.7  25.3  24.7  26.8  24.4  -  TCO2 22 - 32 mmol/L - '21  23  23  23  25  26  26  26  28  24  26  27  '$ -  Acid-base deficit 0.0 - 2.0 mmol/L - 6.0  3.0  3.0  2.0  1.0  1.0  -  O2 Saturation % - 92  95  94  99  100  87  100  100  -  Capillary Blood Glucose: Lab Results  Component Value Date   GLUCAP 104 (H) 08/05/2022   GLUCAP 98 08/05/2022   GLUCAP 119 (H) 08/05/2022   GLUCAP 137 (H) 08/04/2022   GLUCAP 115 (H) 08/04/2022     Exercise Target Goals: Exercise Program Goal: Individual exercise prescription set using results from initial 6 min walk test and THRR while considering  patient's activity barriers and safety.   Exercise Prescription Goal: Initial exercise prescription builds to 30-45 minutes a day of aerobic activity, 2-3 days per week.  Home exercise guidelines will be given to patient during program as part of exercise prescription that the participant will acknowledge.  Activity Barriers & Risk Stratification:  Activity Barriers & Cardiac Risk Stratification - 10/11/22 1506       Activity Barriers & Cardiac Risk Stratification   Activity Barriers Back Problems;Neck/Spine Problems;Incisional Pain;Decreased Ventricular Function    Cardiac Risk Stratification High   <5 METs on 6MWT            6 Minute Walk:  6 Minute Walk     Row Name 10/11/22 1505         6 Minute Walk   Phase Initial     Distance 1166 feet     Walk Time 6  minutes     # of Rest Breaks 0     MPH 2.2     METS 2.18     RPE 10     Perceived Dyspnea  0     VO2 Peak 7.64     Symptoms Yes (comment)     Comments 2/10 R hip pain. resolved with rest     Resting HR 53 bpm     Resting BP 122/54     Resting Oxygen Saturation  100 %     Exercise Oxygen Saturation  during 6 min walk 99 %     Max Ex. HR 69 bpm     Max Ex. BP 152/70     2 Minute Post BP 126/84              Oxygen Initial Assessment:   Oxygen Re-Evaluation:   Oxygen Discharge (Final Oxygen Re-Evaluation):   Initial Exercise Prescription:  Initial Exercise Prescription - 10/11/22 1500       Date of Initial Exercise RX and Referring Provider   Date 10/11/22    Referring Provider Modesto Charon, MD    Expected Discharge Date 12/07/22      Recumbant Bike   Level 1.5    RPM 60    Minutes 15    METs 2.2      NuStep   Level 1    SPM 80    Minutes 15    METs 2      Prescription Details   Frequency (times per week) 3    Duration Progress to 30 minutes of continuous aerobic without signs/symptoms of physical distress      Intensity   Ratings of Perceived Exertion 11-13      Progression   Progression Continue to progress workloads to maintain intensity without signs/symptoms of physical distress.      Resistance Training   Training Prescription Yes    Weight 3    Reps 10-15             Perform Capillary Blood Glucose checks as needed.  Exercise Prescription Changes:   Exercise Prescription Changes     Row Name 10/15/22 [phone]  Response to Exercise   Blood Pressure (Admit) 124/78       Blood Pressure (Exercise) 134/68       Blood Pressure (Exit) 130/70       Heart Rate (Admit) 52 bpm       Heart Rate (Exercise) 75 bpm       Heart Rate (Exit) 57 bpm       Rating of Perceived Exertion (Exercise) 9.5       Perceived Dyspnea (Exercise) 0       Symptoms none       Comments Pt first day in the [facility] ICR program       Duration  Progress to 30 minutes of  aerobic without signs/symptoms of physical distress       Intensity THRR unchanged         Progression   Progression Continue to progress workloads to maintain intensity without signs/symptoms of physical distress.       Average METs 1.9         Resistance Training   Training Prescription Yes       Weight 3       Reps 10-15       Time 10 Minutes         Recumbant Bike   Level 1.5       RPM 60       Minutes 15       METs 1.7         NuStep   Level 1       SPM 78       Minutes 15       METs 2.1                Exercise Comments:   Exercise Comments     Row Name 10/15/22 1633           Exercise Comments Pt first day in the [place] program. Pt tolerated exercise well with an average MET level of 1.9. Pt is learning his THRR, RPE and ExRx. Will continue to monitor pt and progress workloads as tolerated without sign or symtom.                Exercise Goals and Review:   Exercise Goals     Row Name 10/11/22 1521             Exercise Goals   Increase Physical Activity Yes       Intervention Provide advice, education, support and counseling about physical activity/exercise needs.;Develop an individualized exercise prescription for aerobic and resistive training based on initial evaluation findings, risk stratification, comorbidities and participant's personal goals.       Expected Outcomes Short Term: Attend rehab on a regular basis to increase amount of physical activity.;Long Term: Exercising regularly at least 3-5 days a week.;Long Term: Add in home exercise to make exercise part of routine and to increase amount of physical activity.       Increase Strength and Stamina Yes       Intervention Provide advice, education, support and counseling about physical activity/exercise needs.;Develop an individualized exercise prescription for aerobic and resistive training based on initial evaluation findings, risk stratification,  comorbidities and participant's personal goals.       Expected Outcomes Short Term: Increase workloads from initial exercise prescription for resistance, speed, and METs.;Short Term: Perform resistance training exercises routinely during rehab and add in resistance training at home;Long Term: Improve cardiorespiratory fitness, muscular endurance and strength as measured  by increased METs and functional capacity (6MWT)       Able to understand and use rate of perceived exertion (RPE) scale Yes       Intervention Provide education and explanation on how to use RPE scale       Expected Outcomes Short Term: Able to use RPE daily in rehab to express subjective intensity level;Long Term:  Able to use RPE to guide intensity level when exercising independently       Knowledge and understanding of Target Heart Rate Range (THRR) Yes       Intervention Provide education and explanation of THRR including how the numbers were predicted and where they are located for reference       Expected Outcomes Short Term: Able to state/look up THRR;Long Term: Able to use THRR to govern intensity when exercising independently;Short Term: Able to use daily as guideline for intensity in rehab       Understanding of Exercise Prescription Yes       Intervention Provide education, explanation, and written materials on patient's individual exercise prescription       Expected Outcomes Short Term: Able to explain program exercise prescription;Long Term: Able to explain home exercise prescription to exercise independently                Exercise Goals Re-Evaluation :  Exercise Goals Re-Evaluation     [place] Name 10/15/22 1631             Exercise Goal Re-Evaluation   Exercise Goals Review Increase Physical Activity;Understanding of Exercise Prescription;Increase Strength and Stamina;Knowledge and understanding of Target Heart Rate Range (THRR);Able to understand and use rate of perceived exertion (RPE) scale       Comments  Pt first day in the [facility] ICR program. Pt tolerated exercise well with an average MET level of 1.9. Pt is learning his THRR, RPE and ExRx       Expected Outcomes Will continue to monitor pt and progress workloads as tolerated without sign or symtom.                Discharge Exercise Prescription (Final Exercise Prescription Changes):  Exercise Prescription Changes - 10/15/22 1628       Response to Exercise   Blood Pressure (Admit) 124/78    Blood Pressure (Exercise) 134/68    Blood Pressure (Exit) 130/70    Heart Rate (Admit) 52 bpm    Heart Rate (Exercise) 75 bpm    Heart Rate (Exit) 57 bpm    Rating of Perceived Exertion (Exercise) 9.5    Perceived Dyspnea (Exercise) 0    Symptoms none    Comments Pt first day in the [facility] ICR program    Duration Progress to 30 minutes of  aerobic without signs/symptoms of physical distress    Intensity THRR unchanged      Progression   Progression Continue to progress workloads to maintain intensity without signs/symptoms of physical distress.    Average METs 1.9      Resistance Training   Training Prescription Yes    Weight 3    Reps 10-15    Time 10 Minutes      Recumbant Bike   Level 1.5    RPM 60    Minutes 15    METs 1.7      NuStep   Level 1    SPM 78    Minutes 15    METs 2.1  Nutrition:  Target Goals: Understanding of nutrition guidelines, daily intake of sodium '1500mg'$ , cholesterol '200mg'$ , calories 30% from fat and 7% or less from saturated fats, daily to have 5 or more servings of fruits and vegetables.  Biometrics:  Pre Biometrics - 10/11/22 1500       Pre Biometrics   Waist Circumference 39 inches    Hip Circumference 37 inches    Waist to Hip Ratio 1.05 %    Triceps Skinfold 12 mm    % Body Fat 24.8 %    Grip Strength 36 kg    Flexibility 12.5 in    Single Leg Stand 8.56 seconds              Nutrition Therapy Plan and Nutrition Goals:  Nutrition Therapy & Goals - 10/15/22  1619       Nutrition Therapy   Diet Heart Healthy Diet    Drug/Food Interactions Statins/Certain Fruits      Personal Nutrition Goals   Nutrition Goal Patient to identify strategies for managing cardiovascular risk by attending the weekly [facility] education and nutrition series.    Personal Goal #2 Patient to improve diet quality by using the plate method as a guide for meal planning to include lean protein/plant protein, fruits, vegetables, whole grains, and nonfat dairy as part of a balanced diet    Comments Linna Hoff will continue to benefit from participation in intensive cardiac rehab for exercise, nutrition, and lifestyle modification.      Intervention Plan   Intervention Prescribe, educate and counsel regarding individualized specific dietary modifications aiming towards targeted core components such as weight, hypertension, lipid management, diabetes, heart failure and other comorbidities.;Nutrition handout(s) given to patient.    Expected Outcomes Short Term Goal: Understand basic principles of dietary content, such as calories, fat, sodium, cholesterol and nutrients.;Long Term Goal: Adherence to prescribed nutrition plan.             Nutrition Assessments:  Nutrition Assessments - 10/16/22 1056       Rate Your Plate Scores   Pre Score 61            MEDIFICTS Score Key: ?70 Need to make dietary changes  40-70 Heart Healthy Diet ? 40 Therapeutic Level Cholesterol Diet   Flowsheet Row INTENSIVE CARDIAC REHAB from 10/15/2022 in [facility]  Picture Your Plate Total Score on Admission 61      Picture Your Plate Scores: <53 Unhealthy dietary pattern with much room for improvement. 41-50 Dietary pattern unlikely to meet recommendations for good health and room for improvement. 51-60 More healthful dietary pattern, with some room for improvement.  >60 Healthy dietary pattern, although there may be some specific  behaviors that could be improved.    Nutrition Goals Re-Evaluation:  Nutrition Goals Re-Evaluation     [place] Name 10/15/22 1619             Goals   Current Weight 155 lb 3.3 oz (70.4 kg)       Comment A1c 5.7,  LDL 101       Expected Outcome Linna Hoff will continue to benefit from participation in intensive cardiac rehab for exercise, nutrition, and lifestyle modification.                Nutrition Goals Re-Evaluation:  Nutrition Goals Re-Evaluation     [place] Name 10/15/22 1619             Goals   Current Weight 155  lb 3.3 oz (70.4 kg)       Comment A1c 5.7,  LDL 101       Expected Outcome Linna Hoff will continue to benefit from participation in intensive cardiac rehab for exercise, nutrition, and lifestyle modification.                Nutrition Goals Discharge (Final Nutrition Goals Re-Evaluation):  Nutrition Goals Re-Evaluation - 10/15/22 1619       Goals   Current Weight 155 lb 3.3 oz (70.4 kg)    Comment A1c 5.7,  LDL 101    Expected Outcome Linna Hoff will continue to benefit from participation in intensive cardiac rehab for exercise, nutrition, and lifestyle modification.             Psychosocial: Target Goals: Acknowledge presence or absence of significant depression and/or stress, maximize coping skills, provide positive support system. Participant is able to verbalize types and ability to use techniques and skills needed for reducing stress and depression.  Initial Review & Psychosocial Screening:  Initial Psych Review & Screening - 10/11/22 1557       Initial Review   Current issues with Current Stress Concerns    Source of Stress Concerns Financial    Comments Linna Hoff reports having fatigue since his open heart surgery      Family Dynamics   Good Support System? Yes   [name] lives alone with his dog he has his next door neighbor for support his sister lives in [place]     Barriers   Psychosocial barriers to participate in program The patient should benefit from  training in stress management and relaxation.      Screening Interventions   Interventions Encouraged to exercise;Provide feedback about the scores to participant;To provide support and resources with identified psychosocial needs    Expected Outcomes Long Term Goal: Stressors or current issues are controlled or eliminated.;Short Term goal: Identification and review with participant of any Quality of Life or Depression concerns found by scoring the questionnaire.;Long Term goal: The participant improves quality of Life and PHQ9 Scores as seen by post scores and/or verbalization of changes             Quality of Life Scores:  Quality of Life - 10/11/22 1522       Quality of Life   Select Quality of Life      Quality of Life Scores   Health/Function Pre 20.31 %    Socioeconomic Pre 21 %    Psych/Spiritual Pre 18.86 %    Family Pre 0 %   pt skipped all questions relating to family   GLOBAL Pre 20 %            Scores of 19 and below usually indicate a poorer quality of life in these areas.  A difference of  2-3 points is a clinically meaningful difference.  A difference of 2-3 points in the total score of the Quality of Life Index has been associated with significant improvement in overall quality of life, self-image, physical symptoms, and general health in studies assessing change in quality of life.  PHQ-9: Review Flowsheet       10/11/2022  Depression screen PHQ 2/9  Decreased Interest 0  Down, Depressed, Hopeless 0  PHQ - 2 Score 0  Altered sleeping 0  Tired, decreased energy 1  Change in appetite 1  Feeling bad or failure about yourself  0  Trouble concentrating 0  Moving slowly or fidgety/restless 0  Suicidal thoughts 0  PHQ-9 Score 2  Difficult doing work/chores Somewhat difficult   Interpretation of Total Score  Total Score Depression Severity:  1-4 = Minimal depression, 5-9 = Mild depression, 10-14 = Moderate depression, 15-19 = Moderately severe depression,  20-27 = Severe depression   Psychosocial Evaluation and Intervention:   Psychosocial Re-Evaluation:  Psychosocial Re-Evaluation     Row Name 10/17/22 0929 10/24/22 1538           Psychosocial Re-Evaluation   Current issues with Current Stress Concerns Current Stress Concerns      Comments [name] did not voice any increased concerns or stressors on his first day of exercise. Will review quallity of life survey in the upcoming week Linna Hoff has not voiced any increased concerns or stressors during  exercise at intensive . Quality of life questionnarie reviewed. Linna Hoff denies being depressed he says he has himself to rely on      Expected Outcomes Linna Hoff will have controlled or decreased stress upon completion of intensive cardiac rehab Linna Hoff will have controlled or decreased stress upon completion of intensive cardiac rehab      Interventions Stress management education;Encouraged to attend Cardiac Rehabilitation for the exercise Stress management education;Encouraged to attend Cardiac Rehabilitation for the exercise      Continue Psychosocial Services  Follow up required by staff Follow up required by staff        Initial Review   Source of Stress Concerns Financial Financial      Comments Will continue to monitor and offer support as needed Will continue to monitor and offer support as needed               Psychosocial Discharge (Final Psychosocial Re-Evaluation):  Psychosocial Re-Evaluation - 10/24/22 1538       Psychosocial Re-Evaluation   Current issues with Current Stress Concerns    Comments Linna Hoff has not voiced any increased concerns or stressors during  exercise at intensive . Quality of life questionnarie reviewed. Linna Hoff denies being depressed he says he has himself to rely on    Expected Outcomes Linna Hoff will have controlled or decreased stress upon completion of intensive cardiac rehab    Interventions Stress management education;Encouraged to attend Cardiac Rehabilitation for the exercise     Continue Psychosocial Services  Follow up required by staff      Initial Review   Source of Stress Concerns Financial    Comments Will continue to monitor and offer support as needed             Vocational Rehabilitation: Provide vocational rehab assistance to qualifying candidates.   Vocational Rehab Evaluation & Intervention:  Vocational Rehab - 10/11/22 1600       Initial Vocational Rehab Evaluation & Intervention   Assessment shows need for Vocational Rehabilitation No   Linna Hoff is retired and does not need vocational rehab at this time            Education: Education Goals: Education classes will be provided on a weekly basis, covering required topics. Participant will state understanding/return demonstration of topics presented.    Education     Row Name 10/15/22 1500     Education   Cardiac Education Topics [facility]   Select Workshops     Workshops   Educator Exercise Physiologist   Select Psychosocial   Psychosocial Workshop Other  Focused goals and sustainable changes   Instruction Review Code 1- Verbalizes Understanding   Class Start Time 1402   Class Stop Time 1445   Class Time Calculation (  min) 43 min    Row Name 10/17/22 1600     Education   Cardiac Education Topics [employer]   Educator Dietitian   Weekly Topic Tasty Appetizers and Snacks   Instruction Review Code 1- Verbalizes Understanding   Class Start Time 1402   Class Stop Time 1448   Class Time Calculation (min) 46 min    [place] Name 10/19/22 1500     Education   Cardiac Education Topics [facility]   Scientist, research (life sciences)   Educator Dietitian   Select Nutrition   Nutrition Calorie Density   Instruction Review Code 1- Verbalizes Understanding   Class Start Time 1400   Class Stop Time 1442   Class Time Calculation (min) 42 min    [place] Name 10/22/22 1500     Education   Cardiac Education Topics [place]   Select Workshops      Workshops   Educator Exercise Physiologist   Select Exercise   Exercise Workshop Exercise Basics: Building Your Action Plan   Instruction Review Code 1- Verbalizes Understanding   Class Start Time 1359   Class Stop Time 1445   Class Time Calculation (min) 46 min            Core Videos: Exercise    Move It!  Clinical staff conducted group or individual video education with verbal and written material and guidebook.  Patient learns the recommended [facility]in exercise program. Exercise with the goal of living a long, healthy life. Some of the health benefits of exercise include controlled diabetes, healthier blood pressure levels, improved cholesterol levels, improved heart and lung capacity, improved sleep, and better body composition. Everyone should speak with their doctor before starting or changing an exercise routine.  Biomechanical Limitations Clinical staff conducted group or individual video education with verbal and written material and guidebook.  Patient learns how biomechanical limitations can impact exercise and how we can mitigate and possibly overcome limitations to have an impactful and balanced exercise routine.  Body Composition Clinical staff conducted group or individual video education with verbal and written material and guidebook.  Patient learns that body composition (ratio of muscle mass to fat mass) is a key component to assessing overall fitness, rather than body weight alone. Increased fat mass, especially visceral belly fat, can put [place] at increased risk for metabolic syndrome, type 2 diabetes, heart disease, and even death. It is recommended to combine diet and exercise (cardiovascular and resistance training) to improve your body composition. Seek guidance from your physician and exercise physiologist before implementing an exercise routine.  Exercise Action Plan Clinical staff conducted group or individual video education with verbal and written material  and guidebook.  Patient learns the recommended strategies to achieve and enjoy long-term exercise adherence, including variety, self-motivation, self-efficacy, and positive decision making. Benefits of exercise include fitness, good health, weight management, more energy, better sleep, less stress, and overall well-being.  Medical   Heart Disease Risk Reduction Clinical staff conducted group or individual video education with verbal and written material and guidebook.  Patient learns our heart is our most vital organ as it circulates oxygen, nutrients, white blood cells, and hormones throughout the entire body, and carries waste away. Data supports a plant-based eating plan like the [facility]rit[facility]in Program for its effectiveness in slowing progression of and reversing heart disease. The video provides a number of recommendations to address heart disease.   Metabolic Syndrome and  Belly Fat  Clinical staff conducted group or individual video education with verbal and written material and guidebook.  Patient learns what metabolic syndrome is, how it leads to heart disease, and how one can reverse it and keep it from coming back. You have metabolic syndrome if you have 3 of the following 5 criteria: abdominal obesity, high blood pressure, high triglycerides, low HDL cholesterol, and high blood sugar.  Hypertension and Heart Disease Clinical staff conducted group or individual video education with verbal and written material and guidebook.  Patient learns that high blood pressure, or hypertension, is very common in the [place]. Hypertension is largely due to excessive salt intake, but other important risk factors include being overweight, physical inactivity, drinking too much alcohol, smoking, and not eating enough potassium from fruits and vegetables. High blood pressure is a leading risk factor for heart attack, stroke, congestive heart failure, dementia, kidney failure, and premature death. Long-term  effects of excessive salt intake include stiffening of the arteries and thickening of heart muscle and organ damage. Recommendations include ways to reduce hypertension and the risk of heart disease.  Diseases of Our Time - Focusing on Diabetes Clinical staff conducted group or individual video education with verbal and written material and guidebook.  Patient learns why the best way to stop diseases of our time is prevention, through food and other lifestyle changes. Medicine (such as prescription pills and surgeries) is often only a Band-Aid on the problem, not a long-term solution. Most common diseases of our time include obesity, type 2 diabetes, hypertension, heart disease, and cancer. The Pritikin Program is recommended and has been proven to help reduce, reverse, and/or prevent the damaging effects of metabolic syndrome.  Nutrition   Overview of the Pritikin Eating Plan  Clinical staff conducted group or individual video education with verbal and written material and guidebook.  Patient learns about the [place] for disease risk reduction. The [place] emphasizes a wide variety of unrefined, minimally-processed carbohydrates, like fruits, vegetables, whole grains, and legumes. Go, Caution, and Stop food choices are explained. Plant-based and lean animal proteins are emphasized. Rationale provided for low sodium intake for blood pressure control, low added sugars for blood sugar stabilization, and low added fats and oils for coronary artery disease risk reduction and weight management.  Calorie Density  Clinical staff conducted group or individual video education with verbal and written material and guidebook.  Patient learns about calorie density and how it impacts the Pritikin Eating Plan. Knowing the characteristics of the food you choose will help you decide whether those foods will lead to weight gain or weight loss, and whether you want to consume more or less of  them. Weight loss is usually a side effect of the Pritikin Eating Plan because of its focus on low calorie-dense foods.  Label Reading  Clinical staff conducted group or individual video education with verbal and written material and guidebook.  Patient learns about the Pritikin recommended label reading guidelines and corresponding recommendations regarding calorie density, added sugars, sodium content, and whole grains.  Dining Out - Part 1  Clinical staff conducted group or individual video education with verbal and written material and guidebook.  Patient learns that restaurant meals can be sabotaging because they can be so high in calories, fat, sodium, and/or sugar. Patient learns recommended strategies on how to positively address this and avoid unhealthy pitfalls.  Facts on Fats  Clinical staff conducted group or individual video education with verbal and written  material and guidebook.  Patient learns that lifestyle modifications can be just as effective, if not more so, as many medications for lowering your risk of heart disease. A Pritikin lifestyle can help to reduce your risk of inflammation and atherosclerosis (cholesterol build-up, or plaque, in the artery walls). Lifestyle interventions such as dietary choices and physical activity address the cause of atherosclerosis. A review of the types of fats and their impact on blood cholesterol levels, along with dietary recommendations to reduce fat intake is also included.  Nutrition Action Plan  Clinical staff conducted group or individual video education with verbal and written material and guidebook.  Patient learns how to incorporate Pritikin recommendations into their lifestyle. Recommendations include planning and keeping personal health goals in mind as an important part of their success.  Healthy Mind-Set    Healthy Minds, Bodies, Hearts  Clinical staff conducted group or individual video education with verbal and written  material and guidebook.  Patient learns how to identify when they are stressed. Video will discuss the impact of that stress, as well as the many benefits of stress management. Patient will also be introduced to stress management techniques. The way we think, act, and feel has an impact on our hearts.  How Our Thoughts Can Heal Our Hearts  Clinical staff conducted group or individual video education with verbal and written material and guidebook.  Patient learns that negative thoughts can cause depression and anxiety. This can result in negative lifestyle behavior and serious health problems. Cognitive behavioral therapy is an effective method to help control our thoughts in order to change and improve our emotional outlook.  Additional Videos:  Exercise    Improving Performance  Clinical staff conducted group or individual video education with verbal and written material and guidebook.  Patient learns to use a non-linear approach by alternating intensity levels and lengths of time spent exercising to help burn more calories and lose more body fat. Cardiovascular exercise helps improve heart health, metabolism, hormonal balance, blood sugar control, and recovery from fatigue. Resistance training improves strength, endurance, balance, coordination, reaction time, metabolism, and muscle mass. Flexibility exercise improves circulation, posture, and balance. Seek guidance from your physician and exercise physiologist before implementing an exercise routine and learn your capabilities and proper form for all exercise.  Introduction to Yoga  Clinical staff conducted group or individual video education with verbal and written material and guidebook.  Patient learns about yoga, a discipline of the coming together of mind, breath, and body. The benefits of yoga include improved flexibility, improved range of motion, better posture and core strength, increased lung function, weight loss, and positive  self-image. Yoga's heart health benefits include lowered blood pressure, healthier heart rate, decreased cholesterol and triglyceride levels, improved immune function, and reduced stress. Seek guidance from your physician and exercise physiologist before implementing an exercise routine and learn your capabilities and proper form for all exercise.  Medical   Aging: Enhancing Your Quality of Life  Clinical staff conducted group or individual video education with verbal and written material and guidebook.  Patient learns key strategies and recommendations to stay in good physical health and enhance quality of life, such as prevention strategies, having an advocate, securing a [place], and keeping a list of medications and system for tracking them. It also discusses how to avoid risk for bone loss.  Biology of Weight Control  Clinical staff conducted group or individual video education with verbal and written material and guidebook.  Patient learns that weight gain occurs because we consume more calories than we burn (eating more, moving less). Even if your body weight is normal, you may have higher ratios of fat compared to muscle mass. Too much body fat puts you at increased risk for cardiovascular disease, heart attack, stroke, type 2 diabetes, and obesity-related cancers. In addition to exercise, following the [place] can help reduce your risk.  Decoding Lab Results  Clinical staff conducted group or individual video education with verbal and written material and guidebook.  Patient learns that lab test reflects one measurement whose values change over time and are influenced by many factors, including medication, stress, sleep, exercise, food, hydration, pre-existing medical conditions, and more. It is recommended to use the knowledge from this video to become more involved with your lab results and evaluate your numbers to speak with your  doctor.   Diseases of Our Time - Overview  Clinical staff conducted group or individual video education with verbal and written material and guidebook.  Patient learns that according to the CDC, 50% to 70% of chronic diseases (such as obesity, type 2 diabetes, elevated lipids, hypertension, and heart disease) are avoidable through lifestyle improvements including healthier food choices, listening to satiety cues, and increased physical activity.  Sleep Disorders Clinical staff conducted group or individual video education with verbal and written material and guidebook.  Patient learns how good quality and duration of sleep are important to overall health and well-being. Patient also learns about sleep disorders and how they impact health along with recommendations to address them, including discussing with a physician.  Nutrition  Dining Out - Part 2 Clinical staff conducted group or individual video education with verbal and written material and guidebook.  Patient learns how to plan ahead and communicate in order to maximize their dining experience in a healthy and nutritious manner. Included are recommended food choices based on the type of restaurant the patient is visiting.   Fueling a Best boy conducted group or individual video education with verbal and written material and guidebook.  There is a strong connection between our food choices and our health. Diseases like obesity and type 2 diabetes are very prevalent and are in large-part due to lifestyle choices. The Pritikin Eating Plan provides plenty of food and hunger-curbing satisfaction. It is easy to follow, affordable, and helps reduce health risks.  Menu Workshop  Clinical staff conducted group or individual video education with verbal and written material and guidebook.  Patient learns that restaurant meals can sabotage health goals because they are often packed with calories, fat, sodium, and sugar.  Recommendations include strategies to plan ahead and to communicate with the manager, chef, or server to help order a healthier meal.  Planning Your Eating Strategy  Clinical staff conducted group or individual video education with verbal and written material and guidebook.  Patient learns about the [place] and its benefit of reducing the risk of disease. The [place] does not focus on calories. Instead, it emphasizes high-quality, nutrient-rich foods. By knowing the characteristics of the foods, we choose, we can determine their calorie density and make informed decisions.  Targeting Your Nutrition Priorities  Clinical staff conducted group or individual video education with verbal and written material and guidebook.  Patient learns that lifestyle habits have a tremendous impact on disease risk and progression. This video provides eating and physical activity recommendations based on your personal health goals, such as reducing LDL cholesterol, losing  weight, preventing or controlling type 2 diabetes, and reducing high blood pressure.  Vitamins and Minerals  Clinical staff conducted group or individual video education with verbal and written material and guidebook.  Patient learns different ways to obtain key vitamins and minerals, including through a recommended healthy diet. It is important to discuss all supplements you take with your doctor.   Healthy Mind-Set    Smoking Cessation  Clinical staff conducted group or individual video education with verbal and written material and guidebook.  Patient learns that cigarette smoking and tobacco addiction pose a serious health risk which affects millions of people. Stopping smoking will significantly reduce the risk of heart disease, lung disease, and many forms of cancer. Recommended strategies for quitting are covered, including working with your doctor to develop a successful plan.  Culinary   Becoming a Hydrologist conducted group or individual video education with verbal and written material and guidebook.  Patient learns that cooking at home can be healthy, cost-effective, quick, and puts them in control. Keys to cooking healthy recipes will include looking at your recipe, assessing your equipment needs, planning ahead, making it simple, choosing cost-effective seasonal ingredients, and limiting the use of added fats, salts, and sugars.  Cooking - Breakfast and Snacks  Clinical staff conducted group or individual video education with verbal and written material and guidebook.  Patient learns how important breakfast is to satiety and nutrition through the entire day. Recommendations include key foods to eat during breakfast to help stabilize blood sugar levels and to prevent overeating at meals later in the day. Planning ahead is also a key component.  Cooking - Human resources officer conducted group or individual video education with verbal and written material and guidebook.  Patient learns eating strategies to improve overall health, including an approach to cook more at home. Recommendations include thinking of animal protein as a side on your plate rather than center stage and focusing instead on lower calorie dense options like vegetables, fruits, whole grains, and plant-based proteins, such as beans. Making sauces in large quantities to freeze for later and leaving the skin on your vegetables are also recommended to maximize your experience.  Cooking - Healthy Salads and Dressing Clinical staff conducted group or individual video education with verbal and written material and guidebook.  Patient learns that vegetables, fruits, whole grains, and legumes are the foundations of the [place]. Recommendations include how to incorporate each of these in flavorful and healthy salads, and how to create homemade salad dressings. Proper handling of ingredients is also covered.  Cooking - Soups and [employer] - Soups and Desserts Clinical staff conducted group or individual video education with verbal and written material and guidebook.  Patient learns that Pritikin soups and desserts make for easy, nutritious, and delicious snacks and meal components that are low in sodium, fat, sugar, and calorie density, while high in vitamins, minerals, and filling fiber. Recommendations include simple and healthy ideas for soups and desserts.   Overview     The Pritikin Solution Program Overview Clinical staff conducted group or individual video education with verbal and written material and guidebook.  Patient learns that the results of the [place] Program have been documented in more than 100 articles published in peer-reviewed journals, and the benefits include reducing risk factors for (and, in some cases, even reversing) high cholesterol, high blood pressure, type 2 diabetes, obesity, and more! An overview of the three key pillars  of the Pritikin Program will be covered: eating well, doing regular exercise, and having a healthy mind-set.  WORKSHOPS  Exercise: Exercise Basics: Building Your Action Plan Clinical staff led group instruction and group discussion with PowerPoint presentation and patient guidebook. To enhance the learning environment the use of posters, models and videos may be added. At the conclusion of this workshop, patients will comprehend the difference between physical activity and exercise, as well as the benefits of incorporating both, into their routine. Patients will understand the FITT (Frequency, Intensity, Time, and Type) principle and how to use it to build an exercise action plan. In addition, safety concerns and other considerations for exercise and cardiac rehab will be addressed by the presenter. The purpose of this lesson is to promote a comprehensive and effective weekly exercise routine in order to improve patients' overall level of  fitness.   Managing Heart Disease: Your Path to a Healthier Heart Clinical staff led group instruction and group discussion with PowerPoint presentation and patient guidebook. To enhance the learning environment the use of posters, models and videos may be added.At the conclusion of this workshop, patients will understand the anatomy and physiology of the heart. Additionally, they will understand how Pritikin's three pillars impact the risk factors, the progression, and the management of heart disease.  The purpose of this lesson is to provide a high-level overview of the heart, heart disease, and how the Pritikin lifestyle positively impacts risk factors.  Exercise Biomechanics Clinical staff led group instruction and group discussion with PowerPoint presentation and patient guidebook. To enhance the learning environment the use of posters, models and videos may be added. Patients will learn how the structural parts of their bodies function and how these functions impact their daily activities, movement, and exercise. Patients will learn how to promote a neutral spine, learn how to manage pain, and identify ways to improve their physical movement in order to promote healthy living. The purpose of this lesson is to expose patients to common physical limitations that impact physical activity. Participants will learn practical ways to adapt and manage aches and pains, and to minimize their effect on regular exercise. Patients will learn how to maintain good posture while sitting, walking, and lifting.  Balance Training and Fall Prevention  Clinical staff led group instruction and group discussion with PowerPoint presentation and patient guidebook. To enhance the learning environment the use of posters, models and videos may be added. At the conclusion of this workshop, patients will understand the importance of their sensorimotor skills (vision, proprioception, and the vestibular system)  in maintaining their ability to balance as they age. Patients will apply a variety of balancing exercises that are appropriate for their current level of function. Patients will understand the common causes for poor balance, possible solutions to these problems, and ways to modify their physical environment in order to minimize their fall risk. The purpose of this lesson is to teach patients about the importance of maintaining balance as they age and ways to minimize their risk of falling.  WORKSHOPS   Nutrition:  Fueling a Scientist, research (physical sciences) led group instruction and group discussion with PowerPoint presentation and patient guidebook. To enhance the learning environment the use of posters, models and videos may be added. Patients will review the foundational principles of the [place] and understand what constitutes a serving size in each of the food groups. Patients will also learn Pritikin-friendly foods that are better choices when away from home and review make-ahead meal  and snack options. Calorie density will be reviewed and applied to three nutrition priorities: weight maintenance, weight loss, and weight gain. The purpose of this lesson is to reinforce (in a group setting) the key concepts around what patients are recommended to eat and how to apply these guidelines when away from home by planning and selecting Pritikin-friendly options. Patients will understand how calorie density may be adjusted for different weight management goals.  Mindful Eating  Clinical staff led group instruction and group discussion with PowerPoint presentation and patient guidebook. To enhance the learning environment the use of posters, models and videos may be added. Patients will briefly review the concepts of the [place] and the importance of low-calorie dense foods. The concept of mindful eating will be introduced as well as the importance of paying attention to internal hunger  signals. Triggers for non-hunger eating and techniques for dealing with triggers will be explored. The purpose of this lesson is to provide patients with the opportunity to review the basic principles of the [place], discuss the value of eating mindfully and how to measure internal cues of hunger and fullness using the Hunger Scale. Patients will also discuss reasons for non-hunger eating and learn strategies to use for controlling emotional eating.  Targeting Your Nutrition Priorities Clinical staff led group instruction and group discussion with PowerPoint presentation and patient guidebook. To enhance the learning environment the use of posters, models and videos may be added. Patients will learn how to determine their genetic susceptibility to disease by reviewing their family history. Patients will gain insight into the importance of diet as part of an overall healthy lifestyle in mitigating the impact of genetics and other environmental insults. The purpose of this lesson is to provide patients with the opportunity to assess their personal nutrition priorities by looking at their family history, their own health history and current risk factors. Patients will also be able to discuss ways of prioritizing and modifying the [place] for their highest risk areas  Menu  Clinical staff led group instruction and group discussion with PowerPoint presentation and patient guidebook. To enhance the learning environment the use of posters, models and videos may be added. Using menus brought in from [employer], or printed from [employer], patients will apply the [place] dining out guidelines that were presented in the [employer] video. Patients will also be able to practice these guidelines in a variety of provided scenarios. The purpose of this lesson is to provide patients with the opportunity to practice hands-on learning of the [place]  with actual menus and practice scenarios.  Label Reading Clinical staff led group instruction and group discussion with PowerPoint presentation and patient guidebook. To enhance the learning environment the use of posters, models and videos may be added. Patients will review and discuss the Pritikin label reading guidelines presented in Pritikin's Label Reading Educational series video. Using fool labels brought in from local grocery stores and markets, patients will apply the label reading guidelines and determine if the packaged food meet the Pritikin guidelines. The purpose of this lesson is to provide patients with the opportunity to review, discuss, and practice hands-on learning of the Pritikin Label Reading guidelines with actual packaged food labels. [place] Workshops are designed to teach patients ways to prepare quick, simple, and affordable recipes at home. The importance of nutrition's role in chronic disease risk reduction is reflected in its emphasis in the overall  Pritikin program. By learning how to prepare essential core Pritikin Eating Plan recipes, patients will increase control over what they eat; be able to customize the flavor of foods without the use of added salt, sugar, or fat; and improve the quality of the food they consume. By learning a set of core recipes which are easily assembled, quickly prepared, and affordable, patients are more likely to prepare more healthy foods at home. These workshops focus on convenient breakfasts, simple entres, side dishes, and desserts which can be prepared with minimal effort and are consistent with nutrition recommendations for cardiovascular risk reduction. Cooking [employer] are taught by a Engineer, materials (RD) who has been trained by the [employer]. The chef or RD has a clear understanding of the importance of minimizing - if not completely eliminating - added fat, sugar, and  sodium in recipes. Throughout the series of [place] Workshop sessions, patients will learn about healthy ingredients and efficient methods of cooking to build confidence in their capability to prepare    Cooking School weekly topics:  Adding Flavor- Sodium-Free  Fast and Healthy Breakfasts  Powerhouse Plant-Based Proteins  Satisfying Salads and Dressings  Simple Sides and Sauces  International Cuisine-Spotlight on the [place] Zones  Delicious Desserts  Savory Soups  Efficiency Cooking - Meals in a Snap  Tasty Appetizers and Snacks  Comforting Weekend Breakfasts  One-Pot Wonders   Fast Evening Meals  Easy [place] (Psychosocial): New Thoughts, New Behaviors Clinical staff led group instruction and group discussion with PowerPoint presentation and patient guidebook. To enhance the learning environment the use of posters, models and videos may be added. Patients will learn and practice techniques for developing effective health and lifestyle goals. Patients will be able to effectively apply the goal setting process learned to develop at least one new personal goal.  The purpose of this lesson is to expose patients to a new skill set of behavior modification techniques such as techniques setting SMART goals, overcoming barriers, and achieving new thoughts and new behaviors.  Managing Moods and Relationships Clinical staff led group instruction and group discussion with PowerPoint presentation and patient guidebook. To enhance the learning environment the use of posters, models and videos may be added. Patients will learn how emotional and chronic stress factors can impact their health and relationships. They will learn healthy ways to manage their moods and utilize positive coping mechanisms. In addition, ICR patients will learn ways to improve communication skills. The purpose of this lesson is to expose patients to ways  of understanding how one's mood and health are intimately connected. Developing a healthy outlook can help build positive relationships and connections with others. Patients will understand the importance of utilizing effective communication skills that include actively listening and being heard. They will learn and understand the importance of the "4 Cs" and especially Connections in fostering of a Healthy Mind-Set.  Healthy Sleep for a Healthy Heart Clinical staff led group instruction and group discussion with PowerPoint presentation and patient guidebook. To enhance the learning environment the use of posters, models and videos may be added. At the conclusion of this workshop, patients will be able to demonstrate knowledge of the importance of sleep to overall health, well-being, and quality of life. They will understand the symptoms of, and treatments for, common sleep disorders. Patients will also be able to identify daytime and nighttime behaviors which impact sleep, and they will be able  to apply these tools to help manage sleep-related challenges. The purpose of this lesson is to provide patients with a general overview of sleep and outline the importance of quality sleep. Patients will learn about a few of the most common sleep disorders. Patients will also be introduced to the concept of "sleep hygiene," and discover ways to self-manage certain sleeping problems through simple daily behavior changes. Finally, the workshop will motivate patients by clarifying the links between quality sleep and their goals of heart-healthy living.   Recognizing and Reducing Stress Clinical staff led group instruction and group discussion with PowerPoint presentation and patient guidebook. To enhance the learning environment the use of posters, models and videos may be added. At the conclusion of this workshop, patients will be able to understand the types of stress reactions, differentiate between acute and chronic  stress, and recognize the impact that chronic stress has on their health. They will also be able to apply different coping mechanisms, such as reframing negative self-talk. Patients will have the opportunity to practice a variety of stress management techniques, such as deep abdominal breathing, progressive muscle relaxation, and/or guided imagery.  The purpose of this lesson is to educate patients on the role of stress in their lives and to provide healthy techniques for coping with it.  Learning Barriers/Preferences:  Learning Barriers/Preferences - 10/11/22 1523       Learning Barriers/Preferences   Learning Barriers Sight;Hearing   wears glasses, does not wear hearing aids   Learning Preferences Computer/Internet;Group Instruction;Individual Instruction;Pictoral;Skilled Demonstration;Verbal Instruction;Written Material             Education Topics:  Knowledge Questionnaire Score:  Knowledge Questionnaire Score - 10/11/22 1524       Knowledge Questionnaire Score   Pre Score 19/24             Core Components/Risk Factors/Patient Goals at Admission:  Personal Goals and Risk Factors at Admission - 10/11/22 1526       Core Components/Risk Factors/Patient Goals on Admission    Weight Management Yes;Weight Maintenance    Intervention Weight Management: Develop a combined nutrition and exercise program designed to reach desired caloric intake, while maintaining appropriate intake of nutrient and fiber, sodium and fats, and appropriate energy expenditure required for the weight goal.;Weight Management: Provide education and appropriate resources to help participant work on and attain dietary goals.    Expected Outcomes Weight Maintenance: Understanding of the daily nutrition guidelines, which includes 25-35% calories from fat, 7% or less cal from saturated fats, less than '200mg'$  cholesterol, less than 1.5gm of sodium, & 5 or more servings of fruits and vegetables daily;Short Term:  Continue to assess and modify interventions until short term weight is achieved;Long Term: Adherence to nutrition and physical activity/exercise program aimed toward attainment of established weight goal    Hypertension Yes    Intervention Provide education on lifestyle modifcations including regular physical activity/exercise, weight management, moderate sodium restriction and increased consumption of fresh fruit, vegetables, and low fat dairy, alcohol moderation, and smoking cessation.;Monitor prescription use compliance.    Expected Outcomes Long Term: Maintenance of blood pressure at goal levels.;Short Term: Continued assessment and intervention until BP is < 140/48m HG in hypertensive participants. < 130/855mHG in hypertensive participants with diabetes, heart failure or chronic kidney disease.    Lipids Yes    Intervention Provide education and support for participant on nutrition & aerobic/resistive exercise along with prescribed medications to achieve LDL '70mg'$ , HDL >'40mg'$ .    Expected Outcomes Short Term: Participant  states understanding of desired cholesterol values and is compliant with medications prescribed. Participant is following exercise prescription and nutrition guidelines.;Long Term: Cholesterol controlled with medications as prescribed, with individualized exercise RX and with personalized nutrition plan. Value goals: LDL < '70mg'$ , HDL > 40 mg.    Stress Yes    Intervention Offer individual and/or small group education and counseling on adjustment to heart disease, stress management and health-related lifestyle change. Teach and support self-help strategies.;Refer participants experiencing significant psychosocial distress to appropriate mental health specialists for further evaluation and treatment. When possible, include family members and significant others in education/counseling sessions.    Expected Outcomes Short Term: Participant demonstrates changes in health-related behavior,  relaxation and other stress management skills, ability to obtain effective social support, and compliance with psychotropic medications if prescribed.;Long Term: Emotional wellbeing is indicated by absence of clinically significant psychosocial distress or social isolation.             Core Components/Risk Factors/Patient Goals Review:   Goals and Risk Factor Review     Row Name 10/17/22 0936 10/24/22 1540           Core Components/Risk Factors/Patient Goals Review   Personal Goals Review Weight Management/Obesity;Hypertension;Lipids;Stress Weight Management/Obesity;Hypertension;Lipids;Stress      Review Linna Hoff started intensive cardiac rehab on 10/15/22 and did well with exercise Linna Hoff is off to a good start to exercise at  intensive cardiac rehab. Vital signs have been stable      Expected Outcomes Linna Hoff will continue to participate in intensvie cardiac rehab for exercise, nutrition and lifestyle modifications Linna Hoff will continue to participate in intensvie cardiac rehab for exercise, nutrition and lifestyle modifications               Core Components/Risk Factors/Patient Goals at Discharge (Final Review):   Goals and Risk Factor Review - 10/24/22 1540       Core Components/Risk Factors/Patient Goals Review   Personal Goals Review Weight Management/Obesity;Hypertension;Lipids;Stress    Review Linna Hoff is off to a good start to exercise at  intensive cardiac rehab. Vital signs have been stable    Expected Outcomes Linna Hoff will continue to participate in intensvie cardiac rehab for exercise, nutrition and lifestyle modifications             ITP Comments:  ITP Comments     Row Name 10/11/22 1312 10/17/22 0856 10/24/22 1537       ITP Comments Dr. Fransico Him medical director. Introduction to pritikin education program/intensive cardiac rehab. Initital orientation packet reviewed with patient. 30 Day ITP Review. [name] started intensive cardiac rehab on 10/15/22 and did well with exercise.  (P)  30 Day ITP Review. [name] started intensive cardiac rehab on 10/15/22 and is off to a good start to exercise.              Comments: See ITP comments.Harrell Gave RN BSN

## 2022-10-26 ENCOUNTER — Encounter (HOSPITAL_COMMUNITY)
Admission: RE | Admit: 2022-10-26 | Discharge: 2022-10-26 | Disposition: A | Discharge status: Home / Self Care | Payer: Medicare Other | Source: Ambulatory Visit | Attending: Cardiovascular Disease | Admitting: Cardiovascular Disease

## 2022-10-26 DIAGNOSIS — E785 Hyperlipidemia, unspecified: Secondary | ICD-10-CM | POA: Insufficient documentation

## 2022-10-26 DIAGNOSIS — I252 Old myocardial infarction: Secondary | ICD-10-CM | POA: Diagnosis not present

## 2022-10-26 DIAGNOSIS — R001 Bradycardia, unspecified: Secondary | ICD-10-CM | POA: Insufficient documentation

## 2022-10-26 DIAGNOSIS — Z7982 Long term (current) use of aspirin: Secondary | ICD-10-CM | POA: Diagnosis not present

## 2022-10-26 DIAGNOSIS — Z7902 Long term (current) use of antithrombotics/antiplatelets: Secondary | ICD-10-CM | POA: Insufficient documentation

## 2022-10-26 DIAGNOSIS — I2510 Atherosclerotic heart disease of native coronary artery without angina pectoris: Secondary | ICD-10-CM | POA: Diagnosis not present

## 2022-10-26 DIAGNOSIS — Z79899 Other long term (current) drug therapy: Secondary | ICD-10-CM | POA: Diagnosis not present

## 2022-10-26 DIAGNOSIS — Z951 Presence of aortocoronary bypass graft: Secondary | ICD-10-CM | POA: Insufficient documentation

## 2022-10-29 ENCOUNTER — Encounter (HOSPITAL_COMMUNITY)
Admission: RE | Admit: 2022-10-29 | Discharge: 2022-10-29 | Disposition: A | Discharge status: Home / Self Care | Payer: Medicare Other | Source: Ambulatory Visit | Attending: Cardiovascular Disease | Admitting: Cardiovascular Disease

## 2022-10-29 DIAGNOSIS — I2510 Atherosclerotic heart disease of native coronary artery without angina pectoris: Secondary | ICD-10-CM | POA: Diagnosis not present

## 2022-10-29 DIAGNOSIS — Z951 Presence of aortocoronary bypass graft: Secondary | ICD-10-CM

## 2022-10-31 ENCOUNTER — Encounter (HOSPITAL_COMMUNITY)
Admission: RE | Admit: 2022-10-31 | Discharge: 2022-10-31 | Disposition: A | Discharge status: Home / Self Care | Payer: Medicare Other | Source: Ambulatory Visit | Attending: Cardiovascular Disease | Admitting: Cardiovascular Disease

## 2022-10-31 DIAGNOSIS — I2510 Atherosclerotic heart disease of native coronary artery without angina pectoris: Secondary | ICD-10-CM | POA: Diagnosis not present

## 2022-10-31 DIAGNOSIS — Z951 Presence of aortocoronary bypass graft: Secondary | ICD-10-CM

## 2022-10-31 NOTE — Progress Notes (Signed)
CARDIAC REHAB PHASE 2  Reviewed home exercise with pt today. Pt is tolerating exercise well. Pt will continue to exercise on his own by walking, doing hand weights and flexibility training for 2-4 minutes per session 30-45 days a week in addition to the 3 days in CRP2. Advised pt on THRR, RPE scale, hydration and temperature/humidity precautions. Reinforced S/S to stop exercise and when to call MD vs 911. Encouraged warm up cool down and stretches with exercise sessions. Pt verbalized understanding, all questions were answered and pt was given a copy to take home.    Kirby Funk ACSM-CEP 10/31/2022 4:50 PM

## 2022-11-01 ENCOUNTER — Other Ambulatory Visit: Payer: Self-pay | Admitting: Physician Assistant

## 2022-11-01 ENCOUNTER — Other Ambulatory Visit: Payer: Self-pay | Admitting: Thoracic Surgery (Cardiothoracic Vascular Surgery)

## 2022-11-02 ENCOUNTER — Encounter (HOSPITAL_COMMUNITY)
Admission: RE | Admit: 2022-11-02 | Discharge: 2022-11-02 | Disposition: A | Discharge status: Home / Self Care | Payer: Medicare Other | Source: Ambulatory Visit | Attending: Cardiovascular Disease | Admitting: Cardiovascular Disease

## 2022-11-02 DIAGNOSIS — Z951 Presence of aortocoronary bypass graft: Secondary | ICD-10-CM

## 2022-11-02 DIAGNOSIS — I2510 Atherosclerotic heart disease of native coronary artery without angina pectoris: Secondary | ICD-10-CM | POA: Diagnosis not present

## 2022-11-05 ENCOUNTER — Encounter (HOSPITAL_COMMUNITY)
Admission: RE | Admit: 2022-11-05 | Discharge: 2022-11-05 | Disposition: A | Discharge status: Home / Self Care | Payer: Medicare Other | Source: Ambulatory Visit | Attending: Cardiovascular Disease | Admitting: Cardiovascular Disease

## 2022-11-05 DIAGNOSIS — I2510 Atherosclerotic heart disease of native coronary artery without angina pectoris: Secondary | ICD-10-CM | POA: Diagnosis not present

## 2022-11-05 DIAGNOSIS — Z951 Presence of aortocoronary bypass graft: Secondary | ICD-10-CM

## 2022-11-07 ENCOUNTER — Encounter (HOSPITAL_COMMUNITY)
Admission: RE | Admit: 2022-11-07 | Discharge: 2022-11-07 | Disposition: A | Discharge status: Home / Self Care | Payer: Medicare Other | Source: Ambulatory Visit | Attending: Cardiovascular Disease | Admitting: Cardiovascular Disease

## 2022-11-07 DIAGNOSIS — I2510 Atherosclerotic heart disease of native coronary artery without angina pectoris: Secondary | ICD-10-CM | POA: Diagnosis not present

## 2022-11-07 DIAGNOSIS — Z951 Presence of aortocoronary bypass graft: Secondary | ICD-10-CM

## 2022-11-09 ENCOUNTER — Encounter (HOSPITAL_COMMUNITY)
Admission: RE | Admit: 2022-11-09 | Discharge: 2022-11-09 | Disposition: A | Discharge status: Home / Self Care | Payer: Medicare Other | Source: Ambulatory Visit | Attending: Cardiovascular Disease | Admitting: Cardiovascular Disease

## 2022-11-09 DIAGNOSIS — Z951 Presence of aortocoronary bypass graft: Secondary | ICD-10-CM

## 2022-11-09 DIAGNOSIS — I2510 Atherosclerotic heart disease of native coronary artery without angina pectoris: Secondary | ICD-10-CM | POA: Diagnosis not present

## 2022-11-12 ENCOUNTER — Encounter: Payer: Self-pay | Admitting: Cardiovascular Disease

## 2022-11-12 ENCOUNTER — Encounter: Payer: Medicare Other | Admitting: Cardiovascular Disease

## 2022-11-12 ENCOUNTER — Encounter (HOSPITAL_COMMUNITY)
Admission: RE | Admit: 2022-11-12 | Discharge: 2022-11-12 | Disposition: A | Discharge status: Home / Self Care | Payer: Medicare Other | Source: Ambulatory Visit | Attending: Cardiovascular Disease | Admitting: Cardiovascular Disease

## 2022-11-12 VITALS — BP 140/70 | HR 45 | Ht 69.0 in | Wt 153.0 lb

## 2022-11-12 DIAGNOSIS — I2510 Atherosclerotic heart disease of native coronary artery without angina pectoris: Secondary | ICD-10-CM | POA: Diagnosis not present

## 2022-11-12 DIAGNOSIS — Z951 Presence of aortocoronary bypass graft: Secondary | ICD-10-CM

## 2022-11-12 DIAGNOSIS — R001 Bradycardia, unspecified: Secondary | ICD-10-CM

## 2022-11-12 DIAGNOSIS — E785 Hyperlipidemia, unspecified: Secondary | ICD-10-CM

## 2022-11-12 NOTE — Patient Instructions (Signed)
Medication Instructions:  STOP Toprol XL (Metoprolol Succinate) *If you need a refill on your cardiac medications before your next appointment, please call your pharmacy*   Lab Work: *Already scheduled* If you have labs (blood work) drawn today and your tests are completely normal, you will receive your results only by: [place] (if you have MyChart) OR A paper copy in the mail If you have any lab test that is abnormal or we need to change your treatment, we will call you to review the results.   Testing/Procedures: NONE   Follow-Up: At [facility], you and your health needs are our priority.  As part of our continuing mission to provide you with exceptional heart care, we have created designated Provider Care Teams.  These Care Teams include your primary Cardiologist (physician) and Advanced Practice Providers (APPs -  Physician Assistants and Nurse Practitioners) who all work together to provide you with the care you need, when you need it.  We recommend signing up for the patient portal called "MyChart".  Sign up information is provided on this After Visit Summary.  MyChart is used to connect with patients for Virtual Visits (Telemedicine).  Patients are able to view lab/test results, encounter notes, upcoming appointments, etc.  Non-urgent messages can be sent to your provider as well.   To learn more about what you can do with MyChart, go to [email or web address].    Your next appointment:   3 month(s)  Provider:   Ann Maki or Kathlen Mody

## 2022-11-12 NOTE — Progress Notes (Unsigned)
Cardiology Office Note:    Date:  11/12/2022   ID:  Corey [name], DOB Sep 12, 1941, MRN [id number]  PCP:  Christain Sacramento, MD   [employer] Providers Cardiologist:  Sherren Mocha, MD     Referring MD: Christain Sacramento, MD   Chief Complaint  Patient presents with   Coronary Artery Disease    History of Present Illness:    Corey [name] is a 82 y.o. male with a hx of coronary artery disease, presenting for follow-up evaluation.  The patient presented with acute coronary syndrome in November 2023 when he had ST elevation in aVR and marked ST depression in a diffuse manner with changes concerning for left main or multivessel CAD.  A code STEMI was called and the patient was taken emergently to the cardiac catheterization lab.  Cardiac catheterization demonstrated severe stenosis at the ostium of the circumflex, moderately severe distal left main disease, moderately severe proximal LAD stenosis, and mild nonobstructive RCA stenosis.  The patient underwent two-vessel CABG August 01, 2022 with a LIMA to LAD graft and the saphenous vein graft to ramus intermedius.  The patient is here alone today.  He is doing well and continues to participate in cardiac rehab.  He is having some problems with sleep and plans to start taking melatonin.  He denies any recurrent chest pain or pressure.  Mild shortness of breath with activity is noted.  No lightheadedness, heart palpitations, or syncope.  Past Medical History:  Diagnosis Date   CLL (chronic lymphocytic leukemia) ([place])    Coronary artery disease    Diverticulitis    Gout    Hypercholesteremia    Hypertension    Prostate disorder     Past Surgical History:  Procedure Laterality Date   APPENDECTOMY     CARDIAC CATHETERIZATION     CORONARY ARTERY BYPASS GRAFT N/A 08/01/2022   Procedure: 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 USING LEFT INTERNAL MAMMARY AND RIGHT SAPHENOUS LEG VEIN HARVESTED ENDOSCOPICALLY;  Surgeon:  Melrose Nakayama, MD;  Location: [facility];  Service: Open Heart Surgery;  Laterality: N/A;   LEFT HEART CATH AND CORONARY ANGIOGRAPHY N/A 07/31/2022   Procedure: LEFT HEART CATH AND CORONARY ANGIOGRAPHY;  Surgeon: Sherren Mocha, MD;  Location: [facility];  Service: Cardiovascular;  Laterality: N/A;   TEE WITHOUT CARDIOVERSION N/A 08/01/2022   Procedure: TRANSESOPHAGEAL ECHOCARDIOGRAM (TEE);  Surgeon: Melrose Nakayama, MD;  Location: [facility];  Service: Open Heart Surgery;  Laterality: N/A;    Current Medications: Current Meds  Medication Sig   allopurinol (ZYLOPRIM) 300 MG tablet Take 300 mg by mouth daily.   aspirin EC 81 MG tablet Take 1 tablet (81 mg total) by mouth daily.   clopidogrel (PLAVIX) 75 MG tablet Take 1 tablet (75 mg total) by mouth daily.   Cyanocobalamin (VITAMIN B 12) 500 MCG TABS Take 1 tablet by mouth daily.   finasteride (PROSCAR) 5 MG tablet Take 5 mg by mouth daily.   melatonin 3 MG TABS tablet Take 3 mg by mouth at bedtime. Per patient will start taking   omeprazole (PRILOSEC) 40 MG capsule Take 40 mg by mouth daily.   rosuvastatin (CRESTOR) 40 MG tablet Take 1 tablet (40 mg total) by mouth daily.   Tamsulosin HCl (FLOMAX) 0.4 MG CAPS Take 0.4 mg by mouth in the morning and at bedtime.   [DISCONTINUED] furosemide (LASIX) 20 MG tablet Take 1 tablet (20 mg total) by mouth daily.   [DISCONTINUED] metoprolol  succinate (TOPROL XL) 25 MG 24 hr tablet Take 1 tablet (25 mg total) by mouth daily.     Allergies:   Patient has no known allergies.   Social History   Socioeconomic History   Marital status: Divorced    Spouse name: Not on file   Number of children: Not on file   Years of education: 12   Highest education level: Some college, no degree  Occupational History   Occupation: Retired  Tobacco Use   Smoking status: Never   Smokeless tobacco: Never  Scientific laboratory technician Use: Never used  Substance and Sexual Activity   Alcohol use: Yes     Comment: occasional   Drug use: No   Sexual activity: Not Currently  Other Topics Concern   Not on file  Social History Narrative   Not on file   Social Determinants of Health   Financial Resource Strain: Not on file  Food Insecurity: Not on file  Transportation Needs: Not on file  Physical Activity: Not on file  Stress: Not on file  Social Connections: Not on file     Family History: [name] family history includes Heart attack in his father; Hypertension in his father.  ROS:   Please see the history of present illness.    All other systems reviewed and are negative.  EKGs/Labs/Other Studies Reviewed:    EKG:  EKG is not ordered today.    Recent Labs: 08/04/2022: Hemoglobin 10.8; Platelets 103 08/06/2022: Magnesium 1.9 10/08/2022: ALT 8; BUN 9; Creatinine, Ser 0.95; Potassium 3.7; Sodium 144  Recent Lipid Panel    Component Value Date/Time   CHOL 156 10/08/2022 1042   TRIG 109 10/08/2022 1042   HDL 35 (L) 10/08/2022 1042   CHOLHDL 4.5 10/08/2022 1042   CHOLHDL 3.7 07/31/2022 0759   VLDL 22 07/31/2022 0759   LDLCALC 101 (H) 10/08/2022 1042     Risk Assessment/Calculations:          Physical Exam:    VS:  BP (!) 140/70   Pulse (!) 45   Ht 5' 9"$  (1.753 m)   Wt 153 lb (69.4 kg)   SpO2 96%   BMI 22.59 kg/m     Wt Readings from Last 3 Encounters:  11/12/22 153 lb (69.4 kg)  10/11/22 149 lb 11.1 oz (67.9 kg)  08/28/22 152 lb (68.9 kg)     GEN:  Well nourished, well developed in no acute distress HEENT: Normal NECK: No JVD; No carotid bruits LYMPHATICS: No lymphadenopathy CARDIAC: Bradycardic and regular, no murmurs, rubs, gallops RESPIRATORY:  Clear to auscultation without rales, wheezing or rhonchi  ABDOMEN: Soft, non-tender, non-distended MUSCULOSKELETAL:  No edema; No deformity  SKIN: Warm and dry NEUROLOGIC:  Alert and oriented x 3 PSYCHIATRIC:  Normal affect   ASSESSMENT:    1. Coronary artery disease involving native coronary artery  of native heart without angina pectoris   2. Hyperlipidemia LDL goal <70   3. Bradycardia, sinus    PLAN:    In order of problems listed above:  The patient is stable now 3 months out from multivessel CABG.  He was treated with a LIMA to LAD and vein graft to ramus intermedius.  He continues on DAPT with aspirin and clopidogrel since he presented with STEMI and we will plan to continue this through 1 year when he can stop clopidogrel thereafter. Treated with rosuvastatin 40 mg.  Most recent lipids reviewed with an LDL cholesterol of 101.  This is when  he was changed from pravastatin to rosuvastatin.  He is scheduled for follow-up lipids and LFTs in a few months. The patient has marked sinus bradycardia.  I am going to stop his beta-blocker.  Otherwise he will continue his current medications.  He does not appear to be having any symptoms related to this at present.     Medication Adjustments/Labs and Tests Ordered: Current medicines are reviewed at length with the patient today.  Concerns regarding medicines are outlined above.  No orders of the defined types were placed in this encounter.  No orders of the defined types were placed in this encounter.   Patient Instructions  Medication Instructions:  STOP Toprol XL (Metoprolol Succinate) *If you need a refill on your cardiac medications before your next appointment, please call your pharmacy*   Lab Work: *Already scheduled* If you have labs (blood work) drawn today and your tests are completely normal, you will receive your results only by: [place] (if you have MyChart) OR A paper copy in the mail If you have any lab test that is abnormal or we need to change your treatment, we will call you to review the results.   Testing/Procedures: NONE   Follow-Up: At [facility], you and your health needs are our priority.  As part of our continuing mission to provide you with exceptional heart care, we have created  designated Provider Care Teams.  These Care Teams include your primary Cardiologist (physician) and Advanced Practice Providers (APPs -  Physician Assistants and Nurse Practitioners) who all work together to provide you with the care you need, when you need it.  We recommend signing up for the patient portal called "MyChart".  Sign up information is provided on this After Visit Summary.  MyChart is used to connect with patients for Virtual Visits (Telemedicine).  Patients are able to view lab/test results, encounter notes, upcoming appointments, etc.  Non-urgent messages can be sent to your provider as well.   To learn more about what you can do with MyChart, go to [email or web address].    Your next appointment:   3 month(s)  Provider:   Ann Maki or Bufford Lope, Sherren Mocha, MD  11/12/2022 11:56 AM    [place]

## 2022-11-12 NOTE — Progress Notes (Signed)
Medication changes noted. Will continue to monitor the patient throughout  the [place] RN BSN

## 2022-11-14 ENCOUNTER — Encounter (HOSPITAL_COMMUNITY)
Admission: RE | Admit: 2022-11-14 | Discharge: 2022-11-14 | Disposition: A | Discharge status: Home / Self Care | Payer: Medicare Other | Source: Ambulatory Visit | Attending: Cardiovascular Disease | Admitting: Cardiovascular Disease

## 2022-11-14 DIAGNOSIS — I2510 Atherosclerotic heart disease of native coronary artery without angina pectoris: Secondary | ICD-10-CM | POA: Diagnosis not present

## 2022-11-14 DIAGNOSIS — Z951 Presence of aortocoronary bypass graft: Secondary | ICD-10-CM

## 2022-11-16 ENCOUNTER — Encounter (HOSPITAL_COMMUNITY): Payer: Medicare Other

## 2022-11-19 ENCOUNTER — Encounter (HOSPITAL_COMMUNITY)
Admission: RE | Admit: 2022-11-19 | Discharge: 2022-11-19 | Disposition: A | Discharge status: Home / Self Care | Payer: Medicare Other | Source: Ambulatory Visit | Attending: Cardiovascular Disease | Admitting: Cardiovascular Disease

## 2022-11-19 DIAGNOSIS — Z951 Presence of aortocoronary bypass graft: Secondary | ICD-10-CM

## 2022-11-19 DIAGNOSIS — I2510 Atherosclerotic heart disease of native coronary artery without angina pectoris: Secondary | ICD-10-CM | POA: Diagnosis not present

## 2022-11-20 NOTE — Progress Notes (Signed)
Cardiac Individual Treatment Plan  Patient Details  Name: Corey [name] MRN: [id number] Date of Birth: Mar 12, 1941 Referring Provider:   Flowsheet Row INTENSIVE CARDIAC REHAB ORIENT from 10/11/2022 in [facility] [place]  Referring Provider Modesto Charon, MD       Initial Encounter Date:  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 Per patient will start taking, Disp: , Rfl:    omeprazole (PRILOSEC) 40 MG capsule, Take 40 mg by mouth daily., Disp: , Rfl:    rosuvastatin (CRESTOR) 40 MG tablet, Take 1 tablet (40 mg total) by mouth daily., Disp: 90 tablet, Rfl: 3   Tamsulosin HCl (FLOMAX) 0.4 MG CAPS, Take 0.4 mg by mouth in the morning and at bedtime., Disp: , Rfl:   Past Medical History: Past Medical History:  Diagnosis Date   CLL (chronic lymphocytic leukemia) ([place])    Coronary artery disease    Diverticulitis    Gout    Hypercholesteremia    Hypertension    Prostate disorder     Tobacco Use: Social History   Tobacco Use  Smoking Status Never  Smokeless Tobacco Never    Labs: Review Flowsheet       Latest Ref Rng & Units 07/31/2022 08/01/2022 10/08/2022  Labs for ITP Cardiac and Pulmonary  Rehab  Cholestrol 100 - 199 mg/dL 166  - 156   LDL (calc) 0 - 99 mg/dL 99  - 101   HDL-C >39 mg/dL 45  - 35   Trlycerides 0 - 149 mg/dL 109  - 109   Hemoglobin A1c 4.8 - 5.6 % 5.7  - -  PH, Arterial 7.35 - 7.45 - 7.320  7.376  7.378  7.413  7.437  7.363  7.398  7.353  -  PCO2 arterial 32 - 48 mmHg - 38.6  37.1  37.5  34.1  37.5  43.4  43.5  44.0  -  Bicarbonate 20.0 - 28.0 mmol/L - 19.7  21.7  22.2  21.7  25.3  24.7  26.8  24.4  -  TCO2 22 - 32 mmol/L - '21  23  23  23  25  26  26  26  28  24  26  27  '$ -  Acid-base deficit 0.0 - 2.0 mmol/L - 6.0  3.0  3.0  2.0  1.0  1.0  -  O2 Saturation % - 92  95  94  99  100  87  100  100  -    Capillary Blood Glucose: Lab Results  Component Value Date   GLUCAP 104 (H) 08/05/2022   GLUCAP 98  08/05/2022   GLUCAP 119 (H) 08/05/2022   GLUCAP 137 (H) 08/04/2022   GLUCAP 115 (H) 08/04/2022     Exercise Target Goals: Exercise Program Goal: Individual exercise prescription set using results from initial 6 min walk test and THRR while considering  patient's activity barriers and safety.   Exercise Prescription Goal: Initial exercise prescription builds to 30-45 minutes a day of aerobic activity, 2-3 days per week.  Home exercise guidelines will be given to patient during program as part of exercise prescription that the participant will acknowledge.  Activity Barriers & Risk Stratification:  Activity Barriers & Cardiac Risk Stratification - 10/11/22 1506       Activity Barriers & Cardiac Risk Stratification   Activity Barriers Back Problems;Neck/Spine Problems;Incisional Pain;Decreased Ventricular Function    Cardiac Risk Stratification High   <5 METs on 6MWT            6 Minute Walk:  6 Minute Walk     Row Name 10/11/22 1505         6 Minute Walk   Phase Initial     Distance 1166 feet     Walk Time 6 minutes     # of Rest Breaks 0     MPH 2.2     METS 2.18     RPE 10     Perceived Dyspnea  0     VO2 Peak 7.64     Symptoms Yes  (comment)     Comments 2/10 R hip pain. resolved with rest     Resting HR 53 bpm     Resting BP 122/54     Resting Oxygen Saturation  100 %     Exercise Oxygen Saturation  during 6 min walk 99 %     Max Ex. HR 69 bpm     Max Ex. BP 152/70     2 Minute Post BP 126/84              Oxygen Initial Assessment:   Oxygen Re-Evaluation:   Oxygen Discharge (Final Oxygen Re-Evaluation):   Initial Exercise Prescription:  Initial Exercise Prescription - 10/11/22 1500       Date of Initial Exercise RX and Referring Provider   Date 10/11/22    Referring Provider Modesto Charon, MD    Expected Discharge Date 12/07/22      Recumbant Bike   Level 1.5    RPM 60    Minutes 15    METs 2.2      NuStep   Level 1    SPM 80    Minutes 15    METs 2      Prescription Details   Frequency (times per week) 3    Duration Progress to 30 minutes of continuous aerobic without signs/symptoms of physical distress      Intensity   Ratings of Perceived Exertion 11-13      Progression   Progression Continue to progress workloads to maintain intensity without signs/symptoms of physical distress.      Resistance Training   Training Prescription Yes    Weight 3    Reps 10-15             Perform Capillary Blood Glucose checks as needed.  Exercise Prescription Changes:   Exercise Prescription Changes     Row Name 10/15/22 1628 10/31/22 1638 11/19/22 1600         Response to Exercise   Blood Pressure (Admit) 124/78 144/72 130/64     Blood  Pressure (Exercise) 134/68 144/70 138/70     Blood Pressure (Exit) 130/70 124/70 124/60     Heart Rate (Admit) 52 bpm 73 bpm 67 bpm     Heart Rate (Exercise) 75 bpm 116 bpm 119 bpm     Heart Rate (Exit) 57 bpm 82 bpm 76 bpm     Rating of Perceived Exertion (Exercise) 9.'5 11 12     '$ Perceived Dyspnea (Exercise) 0 0 0     Symptoms none none none     Comments Pt first day in the [facility] ICR program Reviewed METs, goals and home Ex Rx  Reviewed METs with pt today     Duration Progress to 30 minutes of  aerobic without signs/symptoms of physical distress Progress to 30 minutes of  aerobic without signs/symptoms of physical distress Progress to 30 minutes of  aerobic without signs/symptoms of physical distress     Intensity THRR unchanged THRR unchanged THRR unchanged       Progression   Progression Continue to progress workloads to maintain intensity without signs/symptoms of physical distress. Continue to progress workloads to maintain intensity without signs/symptoms of physical distress. Continue to progress workloads to maintain intensity without signs/symptoms of physical distress.     Average METs 1.9 2.7 3.5       Resistance Training   Training Prescription Yes No Yes     Weight 3 -- 4     Reps 10-15 -- 10-15     Time 10 Minutes -- --       Recumbant Bike   Level 1.'5 2 4     '$ RPM 60 -- --     Minutes '15 15 15     '$ METs 1.7 2.9 3.8       NuStep   Level '1 4 4     '$ SPM 78 86 103     Minutes '15 15 15     '$ METs 2.1 2.5 3.2       Home Exercise Plan   Plans to continue exercise at -- Home (comment) --     Frequency -- Add 3 additional days to program exercise sessions. --     Initial Home Exercises Provided -- 10/31/22 --              Exercise Comments:   Exercise Comments     Row Name 10/15/22 1633 10/31/22 1649 11/19/22 1647       Exercise Comments Pt first day in the [place] program. Pt tolerated exercise well with an average MET level of 1.9. Pt is learning his THRR, RPE and ExRx. Will continue to monitor pt and progress workloads as tolerated without sign or symtom. Reviewed MET's, goals and home ExRx. Pt tolerated exercise well with an average MET level of 2.7. Pt feels like he is understanding more about how to increase MET's and help increase changes to his strength and stamina. He has not felt much change yet, but is understanding more and will add in exercise by walking, weights and flexibility  training at home 2-4 day s for 30-45 mins per session Reviewed MET's with pt. Pt tolerated exercise well with an average MET level of 3.5. Pt is progressing well in the program, and eager to continue to increase workloads.              Exercise Goals and Review:   Exercise Goals     Row Name 10/11/22 1521             Exercise  Goals   Increase Physical Activity Yes       Intervention Provide advice, education, support and counseling about physical activity/exercise needs.;Develop an individualized exercise prescription for aerobic and resistive training based on initial evaluation findings, risk stratification, comorbidities and participant's personal goals.       Expected Outcomes Short Term: Attend rehab on a regular basis to increase amount of physical activity.;Long Term: Exercising regularly at least 3-5 days a week.;Long Term: Add in home exercise to make exercise part of routine and to increase amount of physical activity.       Increase Strength and Stamina Yes       Intervention Provide advice, education, support and counseling about physical activity/exercise needs.;Develop an individualized exercise prescription for aerobic and resistive training based on initial evaluation findings, risk stratification, comorbidities and participant's personal goals.       Expected Outcomes Short Term: Increase workloads from initial exercise prescription for resistance, speed, and METs.;Short Term: Perform resistance training exercises routinely during rehab and add in resistance training at home;Long Term: Improve cardiorespiratory fitness, muscular endurance and strength as measured by increased METs and functional capacity (6MWT)       Able to understand and use rate of perceived exertion (RPE) scale Yes       Intervention Provide education and explanation on how to use RPE scale       Expected Outcomes Short Term: Able to use RPE daily in rehab to express subjective intensity level;Long  Term:  Able to use RPE to guide intensity level when exercising independently       Knowledge and understanding of Target Heart Rate Range (THRR) Yes       Intervention Provide education and explanation of THRR including how the numbers were predicted and where they are located for reference       Expected Outcomes Short Term: Able to state/look up THRR;Long Term: Able to use THRR to govern intensity when exercising independently;Short Term: Able to use daily as guideline for intensity in rehab       Understanding of Exercise Prescription Yes       Intervention Provide education, explanation, and written materials on patient's individual exercise prescription       Expected Outcomes Short Term: Able to explain program exercise prescription;Long Term: Able to explain home exercise prescription to exercise independently                Exercise Goals Re-Evaluation :  Exercise Goals Re-Evaluation     [place] Name 10/15/22 1631 10/31/22 1642           Exercise Goal Re-Evaluation   Exercise Goals Review Increase Physical Activity;Understanding of Exercise Prescription;Increase Strength and Stamina;Knowledge and understanding of Target Heart Rate Range (THRR);Able to understand and use rate of perceived exertion (RPE) scale Increase Physical Activity;Understanding of Exercise Prescription;Increase Strength and Stamina;Knowledge and understanding of Target Heart Rate Range (THRR);Able to understand and use rate of perceived exertion (RPE) scale      Comments Pt first day in the [facility] program. Pt tolerated exercise well with an average MET level of 1.9. Pt is learning his THRR, RPE and ExRx Reviewed MET's, goals and home ExRx. Pt tolerated exercise well with an average MET level of 2.7. Pt feels like he is understanding more about how to increase MET's and help increase changes to his strength and stamina. He has not felt much change yet, but is understanding more and will add in exercise by  walking, weights and flexibility training  at home 2-4 day s for 30-45 mins per session      Expected Outcomes Will continue to monitor pt and progress workloads as tolerated without sign or symtom. Will continue to monitor pt and progress workloads as tolerated without sign or symtom.               Discharge Exercise Prescription (Final Exercise Prescription Changes):  Exercise Prescription Changes - 11/19/22 1600       Response to Exercise   Blood Pressure (Admit) 130/64    Blood Pressure (Exercise) 138/70    Blood Pressure (Exit) 124/60    Heart Rate (Admit) 67 bpm    Heart Rate (Exercise) 119 bpm    Heart Rate (Exit) 76 bpm    Rating of Perceived Exertion (Exercise) 12    Perceived Dyspnea (Exercise) 0    Symptoms none    Comments Reviewed METs with pt today    Duration Progress to 30 minutes of  aerobic without signs/symptoms of physical distress    Intensity THRR unchanged      Progression   Progression Continue to progress workloads to maintain intensity without signs/symptoms of physical distress.    Average METs 3.5      Resistance Training   Training Prescription Yes    Weight 4    Reps 10-15      Recumbant Bike   Level 4    Minutes 15    METs 3.8      NuStep   Level 4    SPM 103    Minutes 15    METs 3.2             Nutrition:  Target Goals: Understanding of nutrition guidelines, daily intake of sodium '1500mg'$ , cholesterol '200mg'$ , calories 30% from fat and 7% or less from saturated fats, daily to have 5 or more servings of fruits and vegetables.  Biometrics:  Pre Biometrics - 10/11/22 1500       Pre Biometrics   Waist Circumference 39 inches    Hip Circumference 37 inches    Waist to Hip Ratio 1.05 %    Triceps Skinfold 12 mm    % Body Fat 24.8 %    Grip Strength 36 kg    Flexibility 12.5 in    Single Leg Stand 8.56 seconds              Nutrition Therapy Plan and Nutrition Goals:  Nutrition Therapy & Goals - 11/14/22 1509        Nutrition Therapy   Diet Heart Healthy Diet    Drug/Food Interactions Statins/Certain Fruits      Personal Nutrition Goals   Nutrition Goal Patient to identify strategies for managing cardiovascular risk by attending the weekly [facility] education and nutrition series.    Personal Goal #2 Patient to improve diet quality by using the plate method as a guide for meal planning to include lean protein/plant protein, fruits, vegetables, whole grains, and nonfat dairy as part of a balanced diet    Comments Goals in action. Corey [name] continues to attend the [facility] education and nutrition series regularly. He has started making many changes and introduced many [facility] recipes including reduced sodium/reading food labels for sodium and increased high fiber foods. Corey [name] will continue to benefit from adherance to the [facility] eating plan and participation in intensive cardiac rehab for nutrition, exercise, and lifestyle modification.      Intervention Plan   Intervention Prescribe, educate and counsel regarding individualized specific dietary modifications aiming towards  targeted core components such as weight, hypertension, lipid management, diabetes, heart failure and other comorbidities.;Nutrition handout(s) given to patient.    Expected Outcomes Short Term Goal: Understand basic principles of dietary content, such as calories, fat, sodium, cholesterol and nutrients.;Long Term Goal: Adherence to prescribed nutrition plan.             Nutrition Assessments:  Nutrition Assessments - 10/16/22 1056       Rate Your Plate Scores   Pre Score 61            MEDIFICTS Score Key: ?70 Need to make dietary changes  40-70 Heart Healthy Diet ? 40 Therapeutic Level Cholesterol Diet   Flowsheet Row INTENSIVE CARDIAC REHAB from 10/15/2022 in [facility]  Picture Your Plate Total Score on Admission 61      Picture Your Plate Scores: [id number] Unhealthy  dietary pattern with much room for improvement. 41-50 Dietary pattern unlikely to meet recommendations for good health and room for improvement. 51-60 More healthful dietary pattern, with some room for improvement.  >60 Healthy dietary pattern, although there may be some specific behaviors that could be improved.    Nutrition Goals Re-Evaluation:  Nutrition Goals Re-Evaluation     [place] Name 10/15/22 1619 11/14/22 1509           Goals   Current Weight 155 lb 3.3 oz (70.4 kg) 153 lb 3.5 oz (69.5 kg)      Comment A1c 5.7,  LDL 101 no new labs; most recent labs from 10/08/2021  A1c 5.7, LDL 101, HDL 35      Expected Outcome Corey [name] will continue to benefit from participation in intensive cardiac rehab for exercise, nutrition, and lifestyle modification. Goals in action. Corey [name] continues to attend the [facility] education and nutrition series regularly. He has started making many changes and introduced many Prit[facility]in recipes including reduced sodium/reading food labels for sodium and increased high fiber foods. Corey [name] will continue to benefit from adherance to the [facility] eating plan and participation in intensive cardiac rehab for nutrition, exercise, and lifestyle modification.               Nutrition Goals Re-Evaluation:  Nutrition Goals Re-Evaluation     [place] Name 10/15/22 1619 11/14/22 1509           Goals   Current Weight 155 lb 3.3 oz (70.4 kg) 153 lb 3.5 oz (69.5 kg)      Comment A1c 5.7,  LDL 101 no new labs; most recent labs from 10/08/2021  A1c 5.7, LDL 101, HDL 35      Expected Outcome Corey [name] will continue to benefit from participation in intensive cardiac rehab for exercise, nutrition, and lifestyle modification. Goals in action. Corey [name] continues to attend the [facility] education and nutrition series regularly. He has started making many changes and introduced many Pritikin recipes including reduced sodium/reading food labels for sodium and increased high fiber foods. Corey [name] will continue to  benefit from adherance to the [facility] eating plan and participation in intensive cardiac rehab for nutrition, exercise, and lifestyle modification.               Nutrition Goals Discharge (Final Nutrition Goals Re-Evaluation):  Nutrition Goals Re-Evaluation - 11/14/22 1509       Goals   Current Weight 153 lb 3.5 oz (69.5 kg)    Comment no new labs; most recent labs from 10/08/2021  A1c 5.7, LDL 101, HDL 35    Expected Outcome  Goals in action. Corey [name] continues to attend the [facility] education and nutrition series regularly. He has started making many changes and introduced many Prit[facility]in recipes including reduced sodium/reading food labels for sodium and increased high fiber foods. Corey [name] will continue to benefit from adherance to the [facility] eating plan and participation in intensive cardiac rehab for nutrition, exercise, and lifestyle modification.             Psychosocial: Target Goals: Acknowledge presence or absence of significant depression and/or stress, maximize coping skills, provide positive support system. Participant is able to verbalize types and ability to use techniques and skills needed for reducing stress and depression.  Initial Review & Psychosocial Screening:  Initial Psych Review & Screening - 10/11/22 1557       Initial Review   Current issues with Current Stress Concerns    Source of Stress Concerns Financial    Comments Corey [name] reports having fatigue since his open heart surgery      Family Dynamics   Good Support System? Yes   [name] lives alone with his dog he has his next door neighbor for support his sister lives in [place]     Barriers   Psychosocial barriers to participate in program The patient should benefit from training in stress management and relaxation.      Screening Interventions   Interventions Encouraged to exercise;Provide feedback about the scores to participant;To provide support and resources with identified psychosocial needs    Expected  Outcomes Long Term Goal: Stressors or current issues are controlled or eliminated.;Short Term goal: Identification and review with participant of any Quality of Life or Depression concerns found by scoring the questionnaire.;Long Term goal: The participant improves quality of Life and PHQ9 Scores as seen by post scores and/or verbalization of changes             Quality of Life Scores:  Quality of Life - 10/11/22 1522       Quality of Life   Select Quality of Life      Quality of Life Scores   Health/Function Pre 20.31 %    Socioeconomic Pre 21 %    Psych/Spiritual Pre 18.86 %    Family Pre 0 %   pt skipped all questions relating to family   GLOBAL Pre 20 %            Scores of 19 and below usually indicate a poorer quality of life in these areas.  A difference of  2-3 points is a clinically meaningful difference.  A difference of 2-3 points in the total score of the Quality of Life Index has been associated with significant improvement in overall quality of life, self-image, physical symptoms, and general health in studies assessing change in quality of life.  PHQ-9: Review Flowsheet       10/11/2022  Depression screen PHQ 2/9  Decreased Interest 0  Down, Depressed, Hopeless 0  PHQ - 2 Score 0  Altered sleeping 0  Tired, decreased energy 1  Change in appetite 1  Feeling bad or failure about yourself  0  Trouble concentrating 0  Moving slowly or fidgety/restless 0  Suicidal thoughts 0  PHQ-9 Score 2  Difficult doing work/chores Somewhat difficult   Interpretation of Total Score  Total Score Depression Severity:  1-4 = Minimal depression, 5-9 = Mild depression, 10-14 = Moderate depression, 15-19 = Moderately severe depression, 20-27 = Severe depression   Psychosocial Evaluation and Intervention:   Psychosocial Re-Evaluation:  Psychosocial Re-Evaluation  [place] Name 10/17/22 [id number] 10/24/22 1538 11/20/22 1107         Psychosocial Re-Evaluation   Current  issues with Current Stress Concerns Current Stress Concerns Current Stress Concerns     Comments [name] did not voice any increased concerns or stressors on his first day of exercise. Will review quallity of life survey in the upcoming week Corey [name] has not voiced any increased concerns or stressors during  exercise at intensive . Quality of life questionnarie reviewed. Corey [name] denies being depressed he says he has himself to rely on Corey [name] has not voiced any increased concerns or stressors during  exercise at intensive cardiac rehab. Corey [name] will complete cardiac rehab on 12/07/22     Expected Outcomes Corey [name] will have controlled or decreased stress upon completion of intensive cardiac rehab Corey [name] will have controlled or decreased stress upon completion of intensive cardiac rehab Corey [name] will have controlled or decreased stress upon completion of intensive cardiac rehab     Interventions Stress management education;Encouraged to attend Cardiac Rehabilitation for the exercise Stress management education;Encouraged to attend Cardiac Rehabilitation for the exercise Stress management education;Encouraged to attend Cardiac Rehabilitation for the exercise     Continue Psychosocial Services  Follow up required by staff Follow up required by staff No Follow up required       Initial Review   Source of Stress Concerns Financial Financial Financial     Comments Will continue to monitor and offer support as needed Will continue to monitor and offer support as needed Will continue to monitor and offer support as needed              Psychosocial Discharge (Final Psychosocial Re-Evaluation):  Psychosocial Re-Evaluation - 11/20/22 1107       Psychosocial Re-Evaluation   Current issues with Current Stress Concerns    Comments Corey [name] has not voiced any increased concerns or stressors during  exercise at intensive cardiac rehab. Corey [name] will complete cardiac rehab on 12/07/22    Expected Outcomes Corey [name] will have controlled or decreased stress  upon completion of intensive cardiac rehab    Interventions Stress management education;Encouraged to attend Cardiac Rehabilitation for the exercise    Continue Psychosocial Services  No Follow up required      Initial Review   Source of Stress Concerns Financial    Comments Will continue to monitor and offer support as needed             Vocational Rehabilitation: Provide vocational rehab assistance to qualifying candidates.   Vocational Rehab Evaluation & Intervention:  Vocational Rehab - 10/11/22 1600       Initial Vocational Rehab Evaluation & Intervention   Assessment shows need for Vocational Rehabilitation No   Corey [name] is retired and does not need vocational rehab at this time            Education: Education Goals: Education classes will be provided on a weekly basis, covering required topics. Participant will state understanding/return demonstration of topics presented.    Education     Row Name 10/15/22 1500     Education   Cardiac Education Topics [facility]   Select Workshops     Workshops   Educator Exercise Physiologist   Select Psychosocial   Psychosocial Workshop Other  Focused goals and sustainable changes   Instruction Review Code 1- Verbalizes Understanding   Class Start Time 1402   Class Stop Time 1445   Class Time Calculation (min) 43 min    [place] Name 10/17/22 1600  Education   Cardiac Education Topics [employer]   Educator Dietitian   Weekly Topic Tasty Appetizers and Snacks   Instruction Review Code 1- Verbalizes Understanding   Class Start Time 1402   Class Stop Time 1448   Class Time Calculation (min) 46 min    [place] Name 10/19/22 1500     Education   Cardiac Education Topics Pritikin   Scientist, research (life sciences)   Educator Dietitian   Select Nutrition   Nutrition Calorie Density   Instruction Review Code 1- Verbalizes Understanding   Class Start Time 1400   Class Stop Time  1442   Class Time Calculation (min) 42 min    [place] Name 10/22/22 1500     Education   Cardiac Education Topics [place]   Financial planner Exercise Physiologist   Select Exercise   Exercise Workshop Exercise Basics: Building Your Action Plan   Instruction Review Code 1- Verbalizes Understanding   Class Start Time 1359   Class Stop Time 1445   Class Time Calculation (min) 46 min    [place] Name 10/24/22 1600     Education   Cardiac Education Topics Pritikin   Financial trader   Weekly Topic Efficiency Cooking - Meals in a Snap   Instruction Review Code 1- Verbalizes Understanding   Class Start Time [id number]   Class Stop Time 1437   Class Time Calculation (min) 42 min    [place] Name 10/26/22 1500     Education   Cardiac Education Topics Pritikin   Academic librarian Exercise Education   Exercise Education Move It!   Instruction Review Code 1- Verbalizes Understanding   Class Start Time 1405   Class Stop Time 1442   Class Time Calculation (min) 37 min    [place] Name 10/29/22 1500     Education   Cardiac Education Topics Pritikin   Select Core Videos     Core Videos   Educator Dietitian   Nutrition Nutrition Action Plan   Instruction Review Code 1- Verbalizes Understanding   Class Start Time 1400   Class Stop Time 1447   Class Time Calculation (min) 47 min    [place] Name 10/31/22 1500     Education   Cardiac Education Topics Pritikin   Dance movement psychotherapist   Weekly Topic One-Pot Wonders   Instruction Review Code 1- Verbalizes Understanding   Class Start Time 1400   Class Stop Time 1450   Class Time Calculation (min) 50 min    [place] Name 11/02/22 1600     Education   Cardiac Education Topics Pritikin   Architect Education    General Education Hypertension and Heart Disease   Instruction Review Code 1- Verbalizes Understanding   Class Start Time 1410   Class Stop Time 1450   Class Time Calculation (min) 40 min    [place] Name 11/05/22 1500     Education   Cardiac Education Topics Pritikin   Environmental consultant Psychosocial   Psychosocial Workshop Healthy Sleep  for a Healthy Heart   Instruction Review Code 1- Verbalizes Understanding   Class Start Time 1400   Class Stop Time 1450   Class Time Calculation (min) 50 min    [place] Name 11/07/22 1500     Education   Cardiac Education Topics Pritikin   Financial trader   Weekly Topic Comforting Weekend Breakfasts   Instruction Review Code 1- Verbalizes Understanding   Class Start Time 1400   Class Stop Time 1445   Class Time Calculation (min) 45 min    [place] Name 11/07/22 1600     Education   Cardiac Education Topics Pritikin   Financial trader   Weekly Topic Comforting Weekend Breakfasts   Instruction Review Code 1- Verbalizes Understanding   Class Start Time 1400   Class Stop Time 1445   Class Time Calculation (min) 45 min    [place] Name 11/09/22 1500     Education   Cardiac Education Topics Pritikin   Scientific laboratory technician Nutrition   Nutrition Dining Out - Part 1   Instruction Review Code 1- Verbalizes Understanding   Class Start Time 1405   Class Stop Time 1440   Class Time Calculation (min) 35 min    [place] Name 11/12/22 1600     Education   Cardiac Education Topics Pritikin   Academic librarian Exercise Education   Exercise Education Biomechanial Limitations   Instruction Review Code 1- Verbalizes Understanding   Class Start Time 1406   Class Stop Time 1442   Class Time Calculation  (min) 36 min    [place] Name 11/14/22 1600     Education   Cardiac Education Topics Pritikin   Financial trader   Weekly Topic Fast Evening Meals   Instruction Review Code 1- Verbalizes Understanding   Class Start Time 1400   Class Stop Time 1445   Class Time Calculation (min) 45 min    [place] Name 11/19/22 1500     Education   Cardiac Education Topics Pritikin   Financial trader   Weekly Topic International Cuisine- Spotlight on the [place]ones   Instruction Review Code 1- Verbalizes Understanding   Class Start Time 1405   Class Stop Time 1453   Class Time Calculation (min) 48 min            Core Videos: Exercise    Move It!  Clinical staff conducted group or individual video education with verbal and written material and guidebook.  Patient learns the recommended Pritikin exercise program. Exercise with the goal of living a long, healthy life. Some of the health benefits of exercise include controlled diabetes, healthier blood pressure levels, improved cholesterol levels, improved heart and lung capacity, improved sleep, and better body composition. Everyone should speak with their doctor before starting or changing an exercise routine.  Biomechanical Limitations Clinical staff conducted group or individual video education with verbal and written material and guidebook.  Patient learns how biomechanical limitations can impact exercise and how we can mitigate and possibly overcome limitations to have an impactful and balanced exercise routine.  Body Composition Clinical staff conducted group  or individual video education with verbal and written material and guidebook.  Patient learns that body composition (ratio of muscle mass to fat mass) is a key component to assessing overall fitness, rather than body weight alone. Increased fat mass, especially visceral belly fat, can put [place] at  increased risk for metabolic syndrome, type 2 diabetes, heart disease, and even death. It is recommended to combine diet and exercise (cardiovascular and resistance training) to improve your body composition. Seek guidance from your physician and exercise physiologist before implementing an exercise routine.  Exercise Action Plan Clinical staff conducted group or individual video education with verbal and written material and guidebook.  Patient learns the recommended strategies to achieve and enjoy long-term exercise adherence, including variety, self-motivation, self-efficacy, and positive decision making. Benefits of exercise include fitness, good health, weight management, more energy, better sleep, less stress, and overall well-being.  Medical   Heart Disease Risk Reduction Clinical staff conducted group or individual video education with verbal and written material and guidebook.  Patient learns our heart is our most vital organ as it circulates oxygen, nutrients, white blood cells, and hormones throughout the entire body, and carries waste away. Data supports a plant-based eating plan like the Pritikin Program for its effectiveness in slowing progression of and reversing heart disease. The video provides a number of recommendations to address heart disease.   Metabolic Syndrome and Belly Fat  Clinical staff conducted group or individual video education with verbal and written material and guidebook.  Patient learns what metabolic syndrome is, how it leads to heart disease, and how one can reverse it and keep it from coming back. You have metabolic syndrome if you have 3 of the following 5 criteria: abdominal obesity, high blood pressure, high triglycerides, low HDL cholesterol, and high blood sugar.  Hypertension and Heart Disease Clinical staff conducted group or individual video education with verbal and written material and guidebook.  Patient learns that high blood pressure, or  hypertension, is very common in the [place]. Hypertension is largely due to excessive salt intake, but other important risk factors include being overweight, physical inactivity, drinking too much alcohol, smoking, and not eating enough potassium from fruits and vegetables. High blood pressure is a leading risk factor for heart attack, stroke, congestive heart failure, dementia, kidney failure, and premature death. Long-term effects of excessive salt intake include stiffening of the arteries and thickening of heart muscle and organ damage. Recommendations include ways to reduce hypertension and the risk of heart disease.  Diseases of Our Time - Focusing on Diabetes Clinical staff conducted group or individual video education with verbal and written material and guidebook.  Patient learns why the best way to stop diseases of our time is prevention, through food and other lifestyle changes. Medicine (such as prescription pills and surgeries) is often only a Band-Aid on the problem, not a long-term solution. Most common diseases of our time include obesity, type 2 diabetes, hypertension, heart disease, and cancer. The Pritikin Program is recommended and has been proven to help reduce, reverse, and/or prevent the damaging effects of metabolic syndrome.  Nutrition   Overview of the Pritikin Eating Plan  Clinical staff conducted group or individual video education with verbal and written material and guidebook.  Patient learns about the [place] for disease risk reduction. The [place] emphasizes a wide variety of unrefined, minimally-processed carbohydrates, like fruits, vegetables, whole grains, and legumes. Go, Caution, and Stop food choices are explained. Plant-based and lean animal proteins  are emphasized. Rationale provided for low sodium intake for blood pressure control, low added sugars for blood sugar stabilization, and low added fats and oils for coronary artery disease  risk reduction and weight management.  Calorie Density  Clinical staff conducted group or individual video education with verbal and written material and guidebook.  Patient learns about calorie density and how it impacts the Pritikin Eating Plan. Knowing the characteristics of the food you choose will help you decide whether those foods will lead to weight gain or weight loss, and whether you want to consume more or less of them. Weight loss is usually a side effect of the Pritikin Eating Plan because of its focus on low calorie-dense foods.  Label Reading  Clinical staff conducted group or individual video education with verbal and written material and guidebook.  Patient learns about the Pritikin recommended label reading guidelines and corresponding recommendations regarding calorie density, added sugars, sodium content, and whole grains.  Dining Out - Part 1  Clinical staff conducted group or individual video education with verbal and written material and guidebook.  Patient learns that restaurant meals can be sabotaging because they can be so high in calories, fat, sodium, and/or sugar. Patient learns recommended strategies on how to positively address this and avoid unhealthy pitfalls.  Facts on Fats  Clinical staff conducted group or individual video education with verbal and written material and guidebook.  Patient learns that lifestyle modifications can be just as effective, if not more so, as many medications for lowering your risk of heart disease. A Pritikin lifestyle can help to reduce your risk of inflammation and atherosclerosis (cholesterol build-up, or plaque, in the artery walls). Lifestyle interventions such as dietary choices and physical activity address the cause of atherosclerosis. A review of the types of fats and their impact on blood cholesterol levels, along with dietary recommendations to reduce fat intake is also included.  Nutrition Action Plan  Clinical staff  conducted group or individual video education with verbal and written material and guidebook.  Patient learns how to incorporate Pritikin recommendations into their lifestyle. Recommendations include planning and keeping personal health goals in mind as an important part of their success.  Healthy Mind-Set    Healthy Minds, Bodies, Hearts  Clinical staff conducted group or individual video education with verbal and written material and guidebook.  Patient learns how to identify when they are stressed. Video will discuss the impact of that stress, as well as the many benefits of stress management. Patient will also be introduced to stress management techniques. The way we think, act, and feel has an impact on our hearts.  How Our Thoughts Can Heal Our Hearts  Clinical staff conducted group or individual video education with verbal and written material and guidebook.  Patient learns that negative thoughts can cause depression and anxiety. This can result in negative lifestyle behavior and serious health problems. Cognitive behavioral therapy is an effective method to help control our thoughts in order to change and improve our emotional outlook.  Additional Videos:  Exercise    Improving Performance  Clinical staff conducted group or individual video education with verbal and written material and guidebook.  Patient learns to use a non-linear approach by alternating intensity levels and lengths of time spent exercising to help burn more calories and lose more body fat. Cardiovascular exercise helps improve heart health, metabolism, hormonal balance, blood sugar control, and recovery from fatigue. Resistance training improves strength, endurance, balance, coordination, reaction time, metabolism, and muscle mass.  Flexibility exercise improves circulation, posture, and balance. Seek guidance from your physician and exercise physiologist before implementing an exercise routine and learn your capabilities  and proper form for all exercise.  Introduction to Yoga  Clinical staff conducted group or individual video education with verbal and written material and guidebook.  Patient learns about yoga, a discipline of the coming together of mind, breath, and body. The benefits of yoga include improved flexibility, improved range of motion, better posture and core strength, increased lung function, weight loss, and positive self-image. Yoga's heart health benefits include lowered blood pressure, healthier heart rate, decreased cholesterol and triglyceride levels, improved immune function, and reduced stress. Seek guidance from your physician and exercise physiologist before implementing an exercise routine and learn your capabilities and proper form for all exercise.  Medical   Aging: Enhancing Your Quality of Life  Clinical staff conducted group or individual video education with verbal and written material and guidebook.  Patient learns key strategies and recommendations to stay in good physical health and enhance quality of life, such as prevention strategies, having an advocate, securing a [place], and keeping a list of medications and system for tracking them. It also discusses how to avoid risk for bone loss.  Biology of Weight Control  Clinical staff conducted group or individual video education with verbal and written material and guidebook.  Patient learns that weight gain occurs because we consume more calories than we burn (eating more, moving less). Even if your body weight is normal, you may have higher ratios of fat compared to muscle mass. Too much body fat puts you at increased risk for cardiovascular disease, heart attack, stroke, type 2 diabetes, and obesity-related cancers. In addition to exercise, following the [place] can help reduce your risk.  Decoding Lab Results  Clinical staff conducted group or individual video education with verbal and  written material and guidebook.  Patient learns that lab test reflects one measurement whose values change over time and are influenced by many factors, including medication, stress, sleep, exercise, food, hydration, pre-existing medical conditions, and more. It is recommended to use the knowledge from this video to become more involved with your lab results and evaluate your numbers to speak with your doctor.   Diseases of Our Time - Overview  Clinical staff conducted group or individual video education with verbal and written material and guidebook.  Patient learns that according to the CDC, 50% to 70% of chronic diseases (such as obesity, type 2 diabetes, elevated lipids, hypertension, and heart disease) are avoidable through lifestyle improvements including healthier food choices, listening to satiety cues, and increased physical activity.  Sleep Disorders Clinical staff conducted group or individual video education with verbal and written material and guidebook.  Patient learns how good quality and duration of sleep are important to overall health and well-being. Patient also learns about sleep disorders and how they impact health along with recommendations to address them, including discussing with a physician.  Nutrition  Dining Out - Part 2 Clinical staff conducted group or individual video education with verbal and written material and guidebook.  Patient learns how to plan ahead and communicate in order to maximize their dining experience in a healthy and nutritious manner. Included are recommended food choices based on the type of restaurant the patient is visiting.   Fueling a Best boy conducted group or individual video education with verbal and written material and guidebook.  There is a  strong connection between our food choices and our health. Diseases like obesity and type 2 diabetes are very prevalent and are in large-part due to lifestyle choices. The  Pritikin Eating Plan provides plenty of food and hunger-curbing satisfaction. It is easy to follow, affordable, and helps reduce health risks.  Menu Workshop  Clinical staff conducted group or individual video education with verbal and written material and guidebook.  Patient learns that restaurant meals can sabotage health goals because they are often packed with calories, fat, sodium, and sugar. Recommendations include strategies to plan ahead and to communicate with the manager, chef, or server to help order a healthier meal.  Planning Your Eating Strategy  Clinical staff conducted group or individual video education with verbal and written material and guidebook.  Patient learns about the [place] and its benefit of reducing the risk of disease. The [place] does not focus on calories. Instead, it emphasizes high-quality, nutrient-rich foods. By knowing the characteristics of the foods, we choose, we can determine their calorie density and make informed decisions.  Targeting Your Nutrition Priorities  Clinical staff conducted group or individual video education with verbal and written material and guidebook.  Patient learns that lifestyle habits have a tremendous impact on disease risk and progression. This video provides eating and physical activity recommendations based on your personal health goals, such as reducing LDL cholesterol, losing weight, preventing or controlling type 2 diabetes, and reducing high blood pressure.  Vitamins and Minerals  Clinical staff conducted group or individual video education with verbal and written material and guidebook.  Patient learns different ways to obtain key vitamins and minerals, including through a recommended healthy diet. It is important to discuss all supplements you take with your doctor.   Healthy Mind-Set    Smoking Cessation  Clinical staff conducted group or individual video education with verbal and written  material and guidebook.  Patient learns that cigarette smoking and tobacco addiction pose a serious health risk which affects millions of people. Stopping smoking will significantly reduce the risk of heart disease, lung disease, and many forms of cancer. Recommended strategies for quitting are covered, including working with your doctor to develop a successful plan.  Culinary   Becoming a Financial trader conducted group or individual video education with verbal and written material and guidebook.  Patient learns that cooking at home can be healthy, cost-effective, quick, and puts them in control. Keys to cooking healthy recipes will include looking at your recipe, assessing your equipment needs, planning ahead, making it simple, choosing cost-effective seasonal ingredients, and limiting the use of added fats, salts, and sugars.  Cooking - Breakfast and Snacks  Clinical staff conducted group or individual video education with verbal and written material and guidebook.  Patient learns how important breakfast is to satiety and nutrition through the entire day. Recommendations include key foods to eat during breakfast to help stabilize blood sugar levels and to prevent overeating at meals later in the day. Planning ahead is also a key component.  Cooking - Human resources officer conducted group or individual video education with verbal and written material and guidebook.  Patient learns eating strategies to improve overall health, including an approach to cook more at home. Recommendations include thinking of animal protein as a side on your plate rather than center stage and focusing instead on lower calorie dense options like vegetables, fruits, whole grains, and plant-based proteins, such as beans. Making sauces in large quantities  to freeze for later and leaving the skin on your vegetables are also recommended to maximize your experience.  Cooking - Healthy Salads and  Dressing Clinical staff conducted group or individual video education with verbal and written material and guidebook.  Patient learns that vegetables, fruits, whole grains, and legumes are the foundations of the [place]. Recommendations include how to incorporate each of these in flavorful and healthy salads, and how to create homemade salad dressings. Proper handling of ingredients is also covered. Cooking - Soups and [employer] - Soups and Desserts Clinical staff conducted group or individual video education with verbal and written material and guidebook.  Patient learns that Pritikin soups and desserts make for easy, nutritious, and delicious snacks and meal components that are low in sodium, fat, sugar, and calorie density, while high in vitamins, minerals, and filling fiber. Recommendations include simple and healthy ideas for soups and desserts.   Overview     The Pritikin Solution Program Overview Clinical staff conducted group or individual video education with verbal and written material and guidebook.  Patient learns that the results of the [place] Program have been documented in more than 100 articles published in peer-reviewed journals, and the benefits include reducing risk factors for (and, in some cases, even reversing) high cholesterol, high blood pressure, type 2 diabetes, obesity, and more! An overview of the three key pillars of the Pritikin Program will be covered: eating well, doing regular exercise, and having a healthy mind-set.  WORKSHOPS  Exercise: Exercise Basics: Building Your Action Plan Clinical staff led group instruction and group discussion with PowerPoint presentation and patient guidebook. To enhance the learning environment the use of posters, models and videos may be added. At the conclusion of this workshop, patients will comprehend the difference between physical activity and exercise, as well as the benefits of incorporating both, into  their routine. Patients will understand the FITT (Frequency, Intensity, Time, and Type) principle and how to use it to build an exercise action plan. In addition, safety concerns and other considerations for exercise and cardiac rehab will be addressed by the presenter. The purpose of this lesson is to promote a comprehensive and effective weekly exercise routine in order to improve patients' overall level of fitness.   Managing Heart Disease: Your Path to a Healthier Heart Clinical staff led group instruction and group discussion with PowerPoint presentation and patient guidebook. To enhance the learning environment the use of posters, models and videos may be added.At the conclusion of this workshop, patients will understand the anatomy and physiology of the heart. Additionally, they will understand how Pritikin's three pillars impact the risk factors, the progression, and the management of heart disease.  The purpose of this lesson is to provide a high-level overview of the heart, heart disease, and how the Pritikin lifestyle positively impacts risk factors.  Exercise Biomechanics Clinical staff led group instruction and group discussion with PowerPoint presentation and patient guidebook. To enhance the learning environment the use of posters, models and videos may be added. Patients will learn how the structural parts of their bodies function and how these functions impact their daily activities, movement, and exercise. Patients will learn how to promote a neutral spine, learn how to manage pain, and identify ways to improve their physical movement in order to promote healthy living. The purpose of this lesson is to expose patients to common physical limitations that impact physical activity. Participants will learn practical ways to adapt and manage aches and pains, and  to minimize their effect on regular exercise. Patients will learn how to maintain good posture while sitting, walking, and  lifting.  Balance Training and Fall Prevention  Clinical staff led group instruction and group discussion with PowerPoint presentation and patient guidebook. To enhance the learning environment the use of posters, models and videos may be added. At the conclusion of this workshop, patients will understand the importance of their sensorimotor skills (vision, proprioception, and the vestibular system) in maintaining their ability to balance as they age. Patients will apply a variety of balancing exercises that are appropriate for their current level of function. Patients will understand the common causes for poor balance, possible solutions to these problems, and ways to modify their physical environment in order to minimize their fall risk. The purpose of this lesson is to teach patients about the importance of maintaining balance as they age and ways to minimize their risk of falling.  WORKSHOPS   Nutrition:  Fueling a Scientist, research (physical sciences) led group instruction and group discussion with PowerPoint presentation and patient guidebook. To enhance the learning environment the use of posters, models and videos may be added. Patients will review the foundational principles of the [place] and understand what constitutes a serving size in each of the food groups. Patients will also learn Pritikin-friendly foods that are better choices when away from home and review make-ahead meal and snack options. Calorie density will be reviewed and applied to three nutrition priorities: weight maintenance, weight loss, and weight gain. The purpose of this lesson is to reinforce (in a group setting) the key concepts around what patients are recommended to eat and how to apply these guidelines when away from home by planning and selecting Pritikin-friendly options. Patients will understand how calorie density may be adjusted for different weight management goals.  Mindful Eating  Clinical staff led  group instruction and group discussion with PowerPoint presentation and patient guidebook. To enhance the learning environment the use of posters, models and videos may be added. Patients will briefly review the concepts of the [place] and the importance of low-calorie dense foods. The concept of mindful eating will be introduced as well as the importance of paying attention to internal hunger signals. Triggers for non-hunger eating and techniques for dealing with triggers will be explored. The purpose of this lesson is to provide patients with the opportunity to review the basic principles of the [place], discuss the value of eating mindfully and how to measure internal cues of hunger and fullness using the Hunger Scale. Patients will also discuss reasons for non-hunger eating and learn strategies to use for controlling emotional eating.  Targeting Your Nutrition Priorities Clinical staff led group instruction and group discussion with PowerPoint presentation and patient guidebook. To enhance the learning environment the use of posters, models and videos may be added. Patients will learn how to determine their genetic susceptibility to disease by reviewing their family history. Patients will gain insight into the importance of diet as part of an overall healthy lifestyle in mitigating the impact of genetics and other environmental insults. The purpose of this lesson is to provide patients with the opportunity to assess their personal nutrition priorities by looking at their family history, their own health history and current risk factors. Patients will also be able to discuss ways of prioritizing and modifying the [place] for their highest risk areas  Menu  Clinical staff led group instruction and group discussion with PowerPoint presentation and  patient guidebook. To enhance the learning environment the use of posters, models and videos may be added. Using menus  brought in from [employer], or printed from [employer], patients will apply the [place] dining out guidelines that were presented in the [employer] video. Patients will also be able to practice these guidelines in a variety of provided scenarios. The purpose of this lesson is to provide patients with the opportunity to practice hands-on learning of the [place] with actual menus and practice scenarios.  Label Reading Clinical staff led group instruction and group discussion with PowerPoint presentation and patient guidebook. To enhance the learning environment the use of posters, models and videos may be added. Patients will review and discuss the Pritikin label reading guidelines presented in Pritikin's Label Reading Educational series video. Using fool labels brought in from local grocery stores and markets, patients will apply the label reading guidelines and determine if the packaged food meet the Pritikin guidelines. The purpose of this lesson is to provide patients with the opportunity to review, discuss, and practice hands-on learning of the Pritikin Label Reading guidelines with actual packaged food labels. [place] Workshops are designed to teach patients ways to prepare quick, simple, and affordable recipes at home. The importance of nutrition's role in chronic disease risk reduction is reflected in its emphasis in the overall Pritikin program. By learning how to prepare essential core Pritikin Eating Plan recipes, patients will increase control over what they eat; be able to customize the flavor of foods without the use of added salt, sugar, or fat; and improve the quality of the food they consume. By learning a set of core recipes which are easily assembled, quickly prepared, and affordable, patients are more likely to prepare more healthy foods at home. These workshops focus on convenient breakfasts, simple  entres, side dishes, and desserts which can be prepared with minimal effort and are consistent with nutrition recommendations for cardiovascular risk reduction. Cooking [employer] are taught by a Engineer, materials (RD) who has been trained by the [employer]. The chef or RD has a clear understanding of the importance of minimizing - if not completely eliminating - added fat, sugar, and sodium in recipes. Throughout the series of [place] Workshop sessions, patients will learn about healthy ingredients and efficient methods of cooking to build confidence in their capability to prepare    Cooking School weekly topics:  Adding Flavor- Sodium-Free  Fast and Healthy Breakfasts  Powerhouse Plant-Based Proteins  Satisfying Salads and Dressings  Simple Sides and Sauces  International Cuisine-Spotlight on the [place] Zones  Delicious Desserts  Savory Soups  Efficiency Cooking - Meals in a Snap  Tasty Appetizers and Snacks  Comforting Weekend Breakfasts  One-Pot Wonders   Fast Evening Meals  Easy [place] (Psychosocial): New Thoughts, New Behaviors Clinical staff led group instruction and group discussion with PowerPoint presentation and patient guidebook. To enhance the learning environment the use of posters, models and videos may be added. Patients will learn and practice techniques for developing effective health and lifestyle goals. Patients will be able to effectively apply the goal setting process learned to develop at least one new personal goal.  The purpose of this lesson is to expose patients to a new skill set of behavior modification techniques such as techniques setting SMART goals, overcoming barriers, and achieving new thoughts and new behaviors.  Managing Moods and Relationships Clinical staff led group instruction and group discussion with PowerPoint presentation and patient  guidebook. To enhance the learning environment the use of posters, models and videos may be added. Patients will learn how emotional and chronic stress factors can impact their health and relationships. They will learn healthy ways to manage their moods and utilize positive coping mechanisms. In addition, ICR patients will learn ways to improve communication skills. The purpose of this lesson is to expose patients to ways of understanding how one's mood and health are intimately connected. Developing a healthy outlook can help build positive relationships and connections with others. Patients will understand the importance of utilizing effective communication skills that include actively listening and being heard. They will learn and understand the importance of the "4 Cs" and especially Connections in fostering of a Healthy Mind-Set.  Healthy Sleep for a Healthy Heart Clinical staff led group instruction and group discussion with PowerPoint presentation and patient guidebook. To enhance the learning environment the use of posters, models and videos may be added. At the conclusion of this workshop, patients will be able to demonstrate knowledge of the importance of sleep to overall health, well-being, and quality of life. They will understand the symptoms of, and treatments for, common sleep disorders. Patients will also be able to identify daytime and nighttime behaviors which impact sleep, and they will be able to apply these tools to help manage sleep-related challenges. The purpose of this lesson is to provide patients with a general overview of sleep and outline the importance of quality sleep. Patients will learn about a few of the most common sleep disorders. Patients will also be introduced to the concept of "sleep hygiene," and discover ways to self-manage certain sleeping problems through simple daily behavior changes. Finally, the workshop will motivate patients by clarifying the links between quality  sleep and their goals of heart-healthy living.   Recognizing and Reducing Stress Clinical staff led group instruction and group discussion with PowerPoint presentation and patient guidebook. To enhance the learning environment the use of posters, models and videos may be added. At the conclusion of this workshop, patients will be able to understand the types of stress reactions, differentiate between acute and chronic stress, and recognize the impact that chronic stress has on their health. They will also be able to apply different coping mechanisms, such as reframing negative self-talk. Patients will have the opportunity to practice a variety of stress management techniques, such as deep abdominal breathing, progressive muscle relaxation, and/or guided imagery.  The purpose of this lesson is to educate patients on the role of stress in their lives and to provide healthy techniques for coping with it.  Learning Barriers/Preferences:  Learning Barriers/Preferences - 10/11/22 1523       Learning Barriers/Preferences   Learning Barriers Sight;Hearing   wears glasses, does not wear hearing aids   Learning Preferences Computer/Internet;Group Instruction;Individual Instruction;Pictoral;Skilled Demonstration;Verbal Instruction;Written Material             Education Topics:  Knowledge Questionnaire Score:  Knowledge Questionnaire Score - 10/11/22 1524       Knowledge Questionnaire Score   Pre Score 19/24             Core Components/Risk Factors/Patient Goals at Admission:  Personal Goals and Risk Factors at Admission - 10/11/22 1526       Core Components/Risk Factors/Patient Goals on Admission    Weight Management Yes;Weight Maintenance    Intervention Weight Management: Develop a combined nutrition and exercise  program designed to reach desired caloric intake, while maintaining appropriate intake of nutrient and fiber, sodium and fats, and appropriate energy expenditure required for  the weight goal.;Weight Management: Provide education and appropriate resources to help participant work on and attain dietary goals.    Expected Outcomes Weight Maintenance: Understanding of the daily nutrition guidelines, which includes 25-35% calories from fat, 7% or less cal from saturated fats, less than '200mg'$  cholesterol, less than 1.5gm of sodium, & 5 or more servings of fruits and vegetables daily;Short Term: Continue to assess and modify interventions until short term weight is achieved;Long Term: Adherence to nutrition and physical activity/exercise program aimed toward attainment of established weight goal    Hypertension Yes    Intervention Provide education on lifestyle modifcations including regular physical activity/exercise, weight management, moderate sodium restriction and increased consumption of fresh fruit, vegetables, and low fat dairy, alcohol moderation, and smoking cessation.;Monitor prescription use compliance.    Expected Outcomes Long Term: Maintenance of blood pressure at goal levels.;Short Term: Continued assessment and intervention until BP is < 140/16m HG in hypertensive participants. < 130/840mHG in hypertensive participants with diabetes, heart failure or chronic kidney disease.    Lipids Yes    Intervention Provide education and support for participant on nutrition & aerobic/resistive exercise along with prescribed medications to achieve LDL '70mg'$ , HDL >'40mg'$ .    Expected Outcomes Short Term: Participant states understanding of desired cholesterol values and is compliant with medications prescribed. Participant is following exercise prescription and nutrition guidelines.;Long Term: Cholesterol controlled with medications as prescribed, with individualized exercise RX and with personalized nutrition plan. Value goals: LDL < '70mg'$ , HDL > 40 mg.    Stress Yes    Intervention Offer individual and/or small group education and counseling on adjustment to heart disease, stress  management and health-related lifestyle change. Teach and support self-help strategies.;Refer participants experiencing significant psychosocial distress to appropriate mental health specialists for further evaluation and treatment. When possible, include family members and significant others in education/counseling sessions.    Expected Outcomes Short Term: Participant demonstrates changes in health-related behavior, relaxation and other stress management skills, ability to obtain effective social support, and compliance with psychotropic medications if prescribed.;Long Term: Emotional wellbeing is indicated by absence of clinically significant psychosocial distress or social isolation.             Core Components/Risk Factors/Patient Goals Review:   Goals and Risk Factor Review     Row Name 10/17/22 0936 10/24/22 1540 11/20/22 1108         Core Components/Risk Factors/Patient Goals Review   Personal Goals Review Weight Management/Obesity;Hypertension;Lipids;Stress Weight Management/Obesity;Hypertension;Lipids;Stress Weight Management/Obesity;Hypertension;Lipids;Stress     Review DaLinna Hofftarted intensive cardiac rehab on 10/15/22 and did well with exercise DaLinna Hoffs off to a good start to exercise at  intensive cardiac rehab. Vital signs have been stable DaLinna Hoffs doing well with exercise at   intensive cardiac rehab. Vital signs have been stable. DaLinna Hoffill complete intensive cardiac rehab on 12/07/22     Expected Outcomes DaLinna Hoffill continue to participate in intensvie cardiac rehab for exercise, nutrition and lifestyle modifications DaLinna Hoffill continue to participate in intensvie cardiac rehab for exercise, nutrition and lifestyle modifications DaLinna Hoffill continue to participate in intensvie cardiac rehab for exercise, nutrition and lifestyle modifications              Core Components/Risk Factors/Patient Goals at Discharge (Final Review):   Goals and Risk Factor Review - 11/20/22 1108       Core  Components/Risk Factors/Patient Goals Review   Personal Goals Review Weight Management/Obesity;Hypertension;Lipids;Stress    Review Corey [name] is doing well with exercise at   intensive cardiac rehab. Vital signs have been stable. Corey [name] will complete intensive cardiac rehab on 12/07/22    Expected Outcomes Corey [name] will continue to participate in intensvie cardiac rehab for exercise, nutrition and lifestyle modifications             ITP Comments:  ITP Comments     Row Name 10/11/22 1312 10/17/22 0856 10/24/22 1537 11/20/22 1105     ITP Comments Dr. Fransico Him medical director. Introduction to pritikin education program/intensive cardiac rehab. Initital orientation packet reviewed with patient. 30 Day ITP Review. [name] started intensive cardiac rehab on 10/15/22 and did well with exercise. (P)  30 Day ITP Review. [name] started intensive cardiac rehab on 10/15/22 and is off to a good start to exercise. 30 Day ITP Review. Corey [name] has good attendance and participation in intensive cardiac rehab. Corey [name] will complete intensive cardiac rehab on 12/07/22             Comments: see ITP comments.

## 2022-11-21 ENCOUNTER — Encounter (HOSPITAL_COMMUNITY)
Admission: RE | Admit: 2022-11-21 | Discharge: 2022-11-21 | Disposition: A | Discharge status: Home / Self Care | Payer: Medicare Other | Source: Ambulatory Visit | Attending: Cardiovascular Disease | Admitting: Cardiovascular Disease

## 2022-11-21 DIAGNOSIS — I2510 Atherosclerotic heart disease of native coronary artery without angina pectoris: Secondary | ICD-10-CM | POA: Diagnosis not present

## 2022-11-21 DIAGNOSIS — Z951 Presence of aortocoronary bypass graft: Secondary | ICD-10-CM

## 2022-11-22 ENCOUNTER — Ambulatory Visit (HOSPITAL_COMMUNITY): Payer: Medicare Other

## 2022-11-23 ENCOUNTER — Ambulatory Visit: Payer: Medicare Other | Admitting: Nurse Practitioner

## 2022-11-23 ENCOUNTER — Encounter (HOSPITAL_COMMUNITY): Payer: Medicare Other

## 2022-11-26 ENCOUNTER — Encounter (HOSPITAL_COMMUNITY)
Admission: RE | Admit: 2022-11-26 | Discharge: 2022-11-26 | Disposition: A | Discharge status: Home / Self Care | Payer: Medicare Other | Source: Ambulatory Visit | Attending: Cardiovascular Disease | Admitting: Cardiovascular Disease

## 2022-11-26 DIAGNOSIS — Z48812 Encounter for surgical aftercare following surgery on the circulatory system: Secondary | ICD-10-CM | POA: Diagnosis not present

## 2022-11-26 DIAGNOSIS — Z951 Presence of aortocoronary bypass graft: Secondary | ICD-10-CM

## 2022-11-28 ENCOUNTER — Encounter (HOSPITAL_COMMUNITY)
Admission: RE | Admit: 2022-11-28 | Discharge: 2022-11-28 | Disposition: A | Discharge status: Home / Self Care | Payer: Medicare Other | Source: Ambulatory Visit | Attending: Cardiovascular Disease | Admitting: Cardiovascular Disease

## 2022-11-28 DIAGNOSIS — Z951 Presence of aortocoronary bypass graft: Secondary | ICD-10-CM

## 2022-11-30 ENCOUNTER — Encounter (HOSPITAL_COMMUNITY)
Admission: RE | Admit: 2022-11-30 | Discharge: 2022-11-30 | Disposition: A | Discharge status: Home / Self Care | Payer: Medicare Other | Source: Ambulatory Visit | Attending: Cardiovascular Disease | Admitting: Cardiovascular Disease

## 2022-11-30 DIAGNOSIS — Z951 Presence of aortocoronary bypass graft: Secondary | ICD-10-CM

## 2022-12-03 ENCOUNTER — Encounter (HOSPITAL_COMMUNITY)
Admission: RE | Admit: 2022-12-03 | Discharge: 2022-12-03 | Disposition: A | Discharge status: Home / Self Care | Payer: Medicare Other | Source: Ambulatory Visit | Attending: Cardiovascular Disease | Admitting: Cardiovascular Disease

## 2022-12-03 DIAGNOSIS — Z951 Presence of aortocoronary bypass graft: Secondary | ICD-10-CM | POA: Diagnosis not present

## 2022-12-05 ENCOUNTER — Encounter (HOSPITAL_COMMUNITY)
Admission: RE | Admit: 2022-12-05 | Discharge: 2022-12-05 | Disposition: A | Discharge status: Home / Self Care | Payer: Medicare Other | Source: Ambulatory Visit | Attending: Cardiovascular Disease | Admitting: Cardiovascular Disease

## 2022-12-05 VITALS — Ht 69.0 in | Wt 149.7 lb

## 2022-12-05 DIAGNOSIS — Z951 Presence of aortocoronary bypass graft: Secondary | ICD-10-CM | POA: Diagnosis not present

## 2022-12-07 ENCOUNTER — Encounter (HOSPITAL_COMMUNITY)
Admission: RE | Admit: 2022-12-07 | Discharge: 2022-12-07 | Disposition: A | Discharge status: Home / Self Care | Payer: Medicare Other | Source: Ambulatory Visit | Attending: Cardiovascular Disease | Admitting: Cardiovascular Disease

## 2022-12-07 ENCOUNTER — Ambulatory Visit: Payer: Medicare Other | Attending: Nurse Practitioner

## 2022-12-07 DIAGNOSIS — Z951 Presence of aortocoronary bypass graft: Secondary | ICD-10-CM

## 2022-12-07 DIAGNOSIS — E785 Hyperlipidemia, unspecified: Secondary | ICD-10-CM

## 2022-12-08 LAB — LIPID PANEL
Chol/HDL Ratio: 1.3 ratio (ref 0.0–5.0)
Cholesterol, Total: 118 mg/dL (ref 100–199)
HDL: 94 mg/dL (ref >39)
LDL Chol Calc (NIH): 6 mg/dL (ref 0–99)
Triglycerides: 104 mg/dL (ref 0–149)
VLDL Cholesterol Cal: 18 mg/dL (ref 5–40)

## 2022-12-08 LAB — ALT: ALT: 11 IU/L (ref 0–44)

## 2022-12-10 ENCOUNTER — Encounter (HOSPITAL_COMMUNITY)
Admission: RE | Admit: 2022-12-10 | Discharge: 2022-12-10 | Disposition: A | Discharge status: Home / Self Care | Payer: Medicare Other | Source: Ambulatory Visit | Attending: Cardiovascular Disease | Admitting: Cardiovascular Disease

## 2022-12-10 DIAGNOSIS — Z951 Presence of aortocoronary bypass graft: Secondary | ICD-10-CM

## 2022-12-10 NOTE — Progress Notes (Signed)
Discharge Progress Report  Patient Details  Name: Corey [name] MRN: [id number] Date of Birth: Apr 02, 1941 Referring Provider:   Flowsheet Row INTENSIVE CARDIAC REHAB ORIENT from 10/11/2022 in [facility]  Referring Provider Charlett Lango, MD        Number of Visits: 45  Reason for Discharge:  Patient reached a stable level of exercise. Patient independent in their exercise. Patient has met program and personal goals.  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  ADL [facility]:   Initial Exercise Prescription:  Initial Exercise Prescription - 10/11/22 1500       Date of Initial Exercise RX and Referring Provider   Date 10/11/22    Referring Provider Charlett Lango, MD    Expected Discharge Date 12/07/22      Recumbant Bike   Level 1.5    RPM 60    Minutes 15    METs 2.2      NuStep   Level 1    SPM 80    Minutes 15    METs 2      Prescription Details   Frequency (times per week) 3    Duration Progress to 30 minutes of continuous aerobic without signs/symptoms of physical distress      Intensity   Ratings of Perceived Exertion 11-13      Progression   Progression Continue to progress workloads to maintain intensity without signs/symptoms of physical distress.      Resistance Training   Training Prescription Yes    Weight 3    Reps 10-15             Discharge Exercise Prescription (Final Exercise Prescription Changes):  Exercise Prescription Changes - 12/05/22 1600       Response to Exercise   Blood Pressure (Admit) 118/72    Blood Pressure (Exercise) 140/80    Blood Pressure (Exit) 110/62    Heart Rate (Admit) 67 bpm    Heart Rate (Exercise) 115 bpm    Heart Rate (Exit) 76 bpm    Rating of Perceived Exertion (Exercise) 10    Perceived Dyspnea (Exercise) 0    Symptoms 1/10 bilateral  foot pain during    Comments Reviewed METs and Goals    Duration Continue with 30 min of aerobic exercise without signs/symptoms of physical distress.    Intensity THRR unchanged      Progression   Progression Continue to progress workloads to maintain intensity without signs/symptoms of physical distress.    Average METs 3.49      Resistance Training   Training Prescription Yes    Weight 4    Reps 10-15      Recumbant Bike   Level 4    Minutes 15    METs 3.8      NuStep   Level 4    SPM 104    Minutes 15    METs 3.5             Functional Capacity:  6 Minute Walk     Row Name 10/11/22 1505 12/05/22 1649       6 Minute Walk   Phase Initial Discharge    Distance 1166 feet 1440 feet    Distance % Change -- 23.5 %    Distance Feet Change -- 274 ft    Walk Time 6  minutes 6 minutes    # of Rest Breaks 0 0    MPH 2.2 2.72    METS 2.18 3.7    RPE 10 10    Perceived Dyspnea  0 0    VO2 Peak 7.64 12.95    Symptoms Yes (comment) Yes (comment)    Comments 2/10 R hip pain. resolved with rest 1/10 bilateral foot pain    Resting HR 53 bpm 67 bpm    Resting BP 122/54 118/72    Resting Oxygen Saturation  100 % 100 %    Exercise Oxygen Saturation  during 6 min walk 99 % 100 %    Max Ex. HR 69 bpm 115 bpm    Max Ex. BP 152/70 140/80    2 Minute Post BP 126/84 110/62             Psychological, QOL, Others - Outcomes: PHQ 2/9:    12/10/2022    3:54 PM 10/11/2022    3:01 PM  Depression screen PHQ 2/9  Decreased Interest 0 0  Down, Depressed, Hopeless 0 0  PHQ - 2 Score 0 0  Altered sleeping 1 0  Tired, decreased energy 0 1  Change in appetite 0 1  Feeling bad or failure about yourself  0 0  Trouble concentrating 0 0  Moving slowly or fidgety/restless 0 0  Suicidal thoughts 0 0  PHQ-9 Score 1 2  Difficult doing work/chores Not difficult at all Somewhat difficult    Quality of Life:  Quality of Life - 10/11/22 1522       Quality of Life   Select  Quality of Life      Quality of Life Scores   Health/Function Pre 20.31 %    Socioeconomic Pre 21 %    Psych/Spiritual Pre 18.86 %    Family Pre 0 %   pt skipped all questions relating to family   GLOBAL Pre 20 %             Personal Goals: Goals established at orientation with interventions provided to work toward goal.  Personal Goals and Risk Factors at Admission - 10/11/22 1526       Core Components/Risk Factors/Patient Goals on Admission    Weight Management Yes;Weight Maintenance    Intervention Weight Management: Develop a combined nutrition and exercise program designed to reach desired caloric intake, while maintaining appropriate intake of nutrient and fiber, sodium and fats, and appropriate energy expenditure required for the weight goal.;Weight Management: Provide education and appropriate resources to help participant work on and attain dietary goals.    Expected Outcomes Weight Maintenance: Understanding of the daily nutrition guidelines, which includes 25-35% calories from fat, 7% or less cal from saturated fats, less than 200mg  cholesterol, less than 1.5gm of sodium, & 5 or more servings of fruits and vegetables daily;Short Term: Continue to assess and modify interventions until short term weight is achieved;Long Term: Adherence to nutrition and physical activity/exercise program aimed toward attainment of established weight goal    Hypertension Yes    Intervention Provide education on lifestyle modifcations including regular physical activity/exercise, weight management, moderate sodium restriction and increased consumption of fresh fruit, vegetables, and low fat dairy, alcohol moderation, and smoking cessation.;Monitor prescription use compliance.    Expected Outcomes Long Term: Maintenance of blood pressure at goal levels.;Short Term: Continued assessment and intervention until BP is < 140/72mm HG in hypertensive participants. < 130/15mm HG in hypertensive participants  with diabetes, heart failure or chronic kidney disease.  Lipids Yes    Intervention Provide education and support for participant on nutrition & aerobic/resistive exercise along with prescribed medications to achieve LDL 70mg , HDL >40mg .    Expected Outcomes Short Term: Participant states understanding of desired cholesterol values and is compliant with medications prescribed. Participant is following exercise prescription and nutrition guidelines.;Long Term: Cholesterol controlled with medications as prescribed, with individualized exercise RX and with personalized nutrition plan. Value goals: LDL < 70mg , HDL > 40 mg.    Stress Yes    Intervention Offer individual and/or small group education and counseling on adjustment to heart disease, stress management and health-related lifestyle change. Teach and support self-help strategies.;Refer participants experiencing significant psychosocial distress to appropriate mental health specialists for further evaluation and treatment. When possible, include family members and significant others in education/counseling sessions.    Expected Outcomes Short Term: Participant demonstrates changes in health-related behavior, relaxation and other stress management skills, ability to obtain effective social support, and compliance with psychotropic medications if prescribed.;Long Term: Emotional wellbeing is indicated by absence of clinically significant psychosocial distress or social isolation.              Personal Goals Discharge:  Goals and Risk Factor Review     Row Name 10/17/22 0936 10/24/22 1540 11/20/22 1108         Core Components/Risk Factors/Patient Goals Review   Personal Goals Review Weight Management/Obesity;Hypertension;Lipids;Stress Weight Management/Obesity;Hypertension;Lipids;Stress Weight Management/Obesity;Hypertension;Lipids;Stress     Review Linna Hoff started intensive cardiac rehab on 10/15/22 and did well with exercise Linna Hoff is off to a  good start to exercise at  intensive cardiac rehab. Vital signs have been stable Linna Hoff is doing well with exercise at   intensive cardiac rehab. Vital signs have been stable. Linna Hoff will complete intensive cardiac rehab on 12/07/22     Expected Outcomes Linna Hoff will continue to participate in intensvie cardiac rehab for exercise, nutrition and lifestyle modifications Linna Hoff will continue to participate in intensvie cardiac rehab for exercise, nutrition and lifestyle modifications Linna Hoff will continue to participate in intensvie cardiac rehab for exercise, nutrition and lifestyle modifications              Exercise Goals and Review:  Exercise Goals     Row Name 10/11/22 1521             Exercise Goals   Increase Physical Activity Yes       Intervention Provide advice, education, support and counseling about physical activity/exercise needs.;Develop an individualized exercise prescription for aerobic and resistive training based on initial evaluation findings, risk stratification, comorbidities and participant's personal goals.       Expected Outcomes Short Term: Attend rehab on a regular basis to increase amount of physical activity.;Long Term: Exercising regularly at least 3-5 days a week.;Long Term: Add in home exercise to make exercise part of routine and to increase amount of physical activity.       Increase Strength and Stamina Yes       Intervention Provide advice, education, support and counseling about physical activity/exercise needs.;Develop an individualized exercise prescription for aerobic and resistive training based on initial evaluation findings, risk stratification, comorbidities and participant's personal goals.       Expected Outcomes Short Term: Increase workloads from initial exercise prescription for resistance, speed, and METs.;Short Term: Perform resistance training exercises routinely during rehab and add in resistance training at home;Long Term: Improve cardiorespiratory fitness,  muscular endurance and strength as measured by increased METs and functional capacity (6MWT)  Able to understand and use rate of perceived exertion (RPE) scale Yes       Intervention Provide education and explanation on how to use RPE scale       Expected Outcomes Short Term: Able to use RPE daily in rehab to express subjective intensity level;Long Term:  Able to use RPE to guide intensity level when exercising independently       Knowledge and understanding of Target Heart Rate Range (THRR) Yes       Intervention Provide education and explanation of THRR including how the numbers were predicted and where they are located for reference       Expected Outcomes Short Term: Able to state/look up THRR;Long Term: Able to use THRR to govern intensity when exercising independently;Short Term: Able to use daily as guideline for intensity in rehab       Understanding of Exercise Prescription Yes       Intervention Provide education, explanation, and written materials on patient's individual exercise prescription       Expected Outcomes Short Term: Able to explain program exercise prescription;Long Term: Able to explain home exercise prescription to exercise independently                Exercise Goals Re-Evaluation:  Exercise Goals Re-Evaluation     Row Name 10/15/22 1631 10/31/22 1642 12/05/22 1653         Exercise Goal Re-Evaluation   Exercise Goals Review Increase Physical Activity;Understanding of Exercise Prescription;Increase Strength and Stamina;Knowledge and understanding of Target Heart Rate Range (THRR);Able to understand and use rate of perceived exertion (RPE) scale Increase Physical Activity;Understanding of Exercise Prescription;Increase Strength and Stamina;Knowledge and understanding of Target Heart Rate Range (THRR);Able to understand and use rate of perceived exertion (RPE) scale Increase Physical Activity;Understanding of Exercise Prescription;Increase Strength and  Stamina;Knowledge and understanding of Target Heart Rate Range (THRR);Able to understand and use rate of perceived exertion (RPE) scale     Comments Pt first day in the [facility] I[facility] program. Pt tolerated exercise well with an average MET level of 1.9. Pt is learning his THRR, RPE and ExRx Reviewed MET's, goals and home ExRx. Pt tolerated exercise well with an average MET level of 2.7. Pt feels like he is understanding more about how to increase MET's and help increase changes to his strength and stamina. He has not felt much change yet, but is understanding more and will add in exercise by walking, weights and flexibility training at home 2-4 day s for 30-45 mins per session Reviewed MET's and goals. Pt tolerated exercise well with an average MET level of 3.49. Pt feels like he is progressing well towards his goals, he is working more around the yard and house with less fatigue. He is trying to gain weight and working on having more variety with cooking and food.     Expected Outcomes Will continue to monitor pt and progress workloads as tolerated without sign or symtom. Will continue to monitor pt and progress workloads as tolerated without sign or symtom. Will continue to monitor pt and progress workloads as tolerated without sign or symptoms.              Nutrition & Weight - Outcomes:  Pre Biometrics - 10/11/22 1500       Pre Biometrics   Waist Circumference 39 inches    Hip Circumference 37 inches    Waist to Hip Ratio 1.05 %    Triceps Skinfold 12 mm    % Body Fat  24.8 %    Grip Strength 36 kg    Flexibility 12.5 in    Single Leg Stand 8.56 seconds             Post Biometrics - 12/05/22 1700        Post  Biometrics   Height 5\' 9"  (1.753 m)    Weight 67.9 kg    Waist Circumference 36.75 inches    Hip Circumference 36.5 inches    Waist to Hip Ratio 1.01 %    BMI (Calculated) 22.1    Triceps Skinfold 12 mm    % Body Fat 23.5 %    Grip Strength 40 kg    Flexibility 12 in     Single Leg Stand 30 seconds             Nutrition:  Nutrition Therapy & Goals - 12/10/22 1113       Nutrition Therapy   Diet Heart Healthy Diet    Drug/Food Interactions Statins/Certain Fruits      Personal Nutrition Goals   Nutrition Goal Patient to identify strategies for managing cardiovascular risk by attending the weekly [facility] education and nutrition series.    Personal Goal #2 Patient to improve diet quality by using the plate method as a guide for meal planning to include lean protein/plant protein, fruits, vegetables, whole grains, and nonfat dairy as part of a balanced diet    Comments Goals in action. Jesusita OkaDan continues to attend the [facility] education and nutrition series regularly. He has started making many changes and introduced many [employer] recipes including reduced sodium/reading food labels for sodium and increased high fiber foods. He is interested in gaining some weight up to 160#; we discussed strategies for weight gain including increasing eating frequency, nutrition supplements, etc. Stressed the importance of eliminating meal skipping (lunch).  Jesusita OkaDan will continue to benefit from adherance to the [facility] eating plan and participation in intensive cardiac rehab for nutrition, exercise, and lifestyle modification.      Intervention Plan   Intervention Prescribe, educate and counsel regarding individualized specific dietary modifications aiming towards targeted core components such as weight, hypertension, lipid management, diabetes, heart failure and other comorbidities.;Nutrition handout(s) given to patient.    Expected Outcomes Short Term Goal: Understand basic principles of dietary content, such as calories, fat, sodium, cholesterol and nutrients.;Long Term Goal: Adherence to prescribed nutrition plan.;Short Term Goal: A plan has been developed with personal nutrition goals set during dietitian appointment.             Nutrition Discharge:  Nutrition  Assessments - 10/16/22 1056       Rate Your Plate Scores   Pre Score 61             Education Questionnaire Score:  Knowledge Questionnaire Score - 10/11/22 1524       Knowledge Questionnaire Score   Pre Score 19/24             Goals reviewed with patient; copy given to patient.Pt graduates from  Intensive cardiac rehab program on 12/10/22  with completion of  45 exercise and education sessions. Pt maintained good attendance and progressed nicely during their participation in rehab as evidenced by increased MET level.   Medication list reconciled. Repeat  PHQ score-  1.  Pt has made significant lifestyle changes and should be commended for their success. [name]  achieved their goals during cardiac rehab.   Pt plans to continue exercise at home by walking and using weights. [name] increased his  distance on his post exercise walk test by 274 feet. Jesusita Oka feels stronger and is happy that his lipid profile has improved. We are proud of [name]'s progress!Thayer Headings RN BSN

## 2022-12-12 ENCOUNTER — Telehealth: Payer: Self-pay | Admitting: Hematology

## 2022-12-12 NOTE — Telephone Encounter (Signed)
Called patient per provider PAL to reschedule 8/30 appointments. Patient rescheduled and notified.

## 2023-02-07 NOTE — Progress Notes (Signed)
Cardiology Office Note:    Date:  02/11/2023   ID:  Corey [name], Corey [name] 02-19-1941, MRN [id number]  PCP:  Barbie Banner, MD   [facility] Providers Cardiologist:  Tonny Bollman, MD     Referring MD: Barbie Banner, MD   Chief Complaint:   History of Present Illness:    Corey [name] is a  82 y.o. male with a hx of hypertension, CLL, hyperlipidemia. Has been followed at [facility] for CLL, has been stage 0 with no significant signs of progression at last office visit on 05/23/2022 with history of mild thrombocytopenia but stable counts.  No prior cardiac history.  His father needed CABG in his 44s but passed away before the surgery could be done.  Admitted to [facility] 07/31/2022 for chest pain. Reports episode of chest pain several days prior while outside walking his dog which was brief in nature but associated with shortness of breath. On the day of admission he was again walking his dog when he developed sever symptoms and called EMS. EKG in the field showed diffuse ST depression with ST elevation in aVR.  Code STEMI was called in the field.  Of note there was a simultaneous STEMI called which was transported to the Cath Lab emergently given ongoing chest pain. Dr. Excell Seltzer evaluated patient in the ED and transported him to the Cath Lab for further management.   Left heart cath 07/31/2022 revealed thrombotic ulcerated severe stenosis of the ostial/proximal circumflex (culprit lesion), moderately severe calcific distal left main stenosis of 70%, moderately severe calcified proximal LAD stenosis of 70%, mild diffuse nonobstructive plaquing of a large dominant RCA which supplies a large PDA branch and multiple posterior lateral branches, normal LV systolic function with no regional wall abnormalities. Difficult anatomy for PCI due to calcific distal left main and ostial LAD stenosis and severe ostial circumflex stenosis. Cardiac surgery called and evaluated the patient's cath films while on the  table. Agreement that best suited for coronary bypass surgery.  He underwent CABG x 2 on 08/01/2022 with LIMA to LAD, SVG to ramus intermedius, endoscopic vein harvest right thigh.  Postop operative CXR showed gastric dilatation.  He was treated with Reglan and diet was advanced slowly.  He became agitated and confused overnight and was combative towards staff and required treatment with Haldol. Discharged home on 08/06/2022. Did not wish to have home OT/PT.   He called our office to report stomach pain on 08/07/22 which resolved the following day. He called 11/22 to report burning with urination and was encouraged to call urologist.   Seen in cardiology clinic by me on 08/20/22 for follow-up. Reported since surgery he cannot taste anything, has problems with his eyesight, and difficulty hearing. More fatigued than prior to surgery. Soreness in left chest that radiates into his back. Able to walk > 15 minutes twice daily, weather dependent, with no increase in chest discomfort or SOB with walking. Feels more discomfort in cold air. Is using spirometer with some improvement noted. Cannot sleep on left side because it causes pain in the right side. He still has chest tube sutures intact. Sternotomy incision is well-healed, no signs of infection. Home BP 137/70 mmHg, HR 50s-60s bpm. Main complaint is urinary retention, does not want to see urology but understands he needs to contact them for follow-up. We removed his chest tube sutures and he was advised to return for repeat lipid testing in 2 months. We discussed lowering metoprolol dose in the setting  of fatigue but he wanted to continue to monitor and call if symptoms worsened.  Seen in clinic by Dr. Excell Seltzer on 11/12/22 at which time he reported participation in cardiac rehab with  mild SOB with activity.  Beta-blocker was stopped due to marked sinus bradycardia. Advised to continue DAPT for one year post STEMI and then continue aspirin monotherapy (starting  07/2023). LDL improved to 6 on lab work completed 12/07/22.   Today, he is here for 3 month follow-up. Occasional soreness in left chest that occurs sporadically, does not worsen with exertion and is not accompanied by additional symptoms of SOB, n/v, or diaphoresis. He completed cardiac rehab and remains active at home, got some hand weights for home.  Walks his dog consistently and maintains his home and yard.  No activity intolerance.  States he read that omeprazole interacts with Plavix.  Does not know why he is taking omeprazole or for how long. BP is elevated. He occasionally checks it at home. Is currently taking Mucinex D, prednisone, and antibiotic for sinus infection. He denies shortness of breath, lower extremity edema, fatigue, palpitations, melena, hematuria, hemoptysis, diaphoresis, weakness, presyncope, syncope, orthopnea, and PND. Does most of his own cooking and limits sodium.    Past Medical History:  Diagnosis Date   CLL (chronic lymphocytic leukemia) (HCC)    Coronary artery disease    Diverticulitis    Gout    Hypercholesteremia    Hypertension    Prostate disorder     Past Surgical History:  Procedure Laterality Date   APPENDECTOMY     CARDIAC CATHETERIZATION     CORONARY ARTERY BYPASS GRAFT N/A 08/01/2022   Procedure: 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 USING LEFT INTERNAL MAMMARY AND RIGHT SAPHENOUS LEG VEIN HARVESTED ENDOSCOPICALLY;  Surgeon: Loreli Slot, MD;  Location: [facility] OR;  Service: Open Heart Surgery;  Laterality: N/A;   LEFT HEART CATH AND CORONARY ANGIOGRAPHY N/A 07/31/2022   Procedure: LEFT HEART CATH AND CORONARY ANGIOGRAPHY;  Surgeon: Tonny Bollman, MD;  Location: [facility] INVASIVE CV LAB;  Service: Cardiovascular;  Laterality: N/A;   TEE WITHOUT CARDIOVERSION N/A 08/01/2022   Procedure: TRANSESOPHAGEAL ECHOCARDIOGRAM (TEE);  Surgeon: Loreli Slot, MD;  Location: [facility] OR;  Service: Open Heart Surgery;  Laterality: N/A;    Current  Medications: Current Meds  Medication Sig   allopurinol (ZYLOPRIM) 300 MG tablet Take 300 mg by mouth daily.   aspirin EC 81 MG tablet Take 1 tablet (81 mg total) by mouth daily.   clopidogrel (PLAVIX) 75 MG tablet Take 1 tablet (75 mg total) by mouth daily.   Cyanocobalamin (VITAMIN B 12) 500 MCG TABS Take 1 tablet by mouth daily.   famotidine (PEPCID) 20 MG tablet Take 1 tablet (20 mg total) by mouth daily.   finasteride (PROSCAR) 5 MG tablet Take 5 mg by mouth daily.   melatonin 3 MG TABS tablet Take 3 mg by mouth at bedtime. Per patient will start taking   rosuvastatin (CRESTOR) 40 MG tablet Take 1 tablet (40 mg total) by mouth daily.   Tamsulosin HCl (FLOMAX) 0.4 MG CAPS Take 0.4 mg by mouth in the morning and at bedtime.   [DISCONTINUED] omeprazole (PRILOSEC) 40 MG capsule Take 40 mg by mouth every other day.     Allergies:   Patient has no known allergies.   Social History   Socioeconomic History   Marital status: Divorced    Spouse name: Not on file   Number of children: Not on file  Years of education: 59   Highest education level: Some college, no degree  Occupational History   Occupation: Retired  Tobacco Use   Smoking status: Never   Smokeless tobacco: Never  Vaping Use   Vaping Use: Never used  Substance and Sexual Activity   Alcohol use: Yes    Comment: occasional   Drug use: No   Sexual activity: Not Currently  Other Topics Concern   Not on file  Social History Narrative   Not on file   Social Determinants of Health   Financial Resource Strain: Not on file  Food Insecurity: Not on file  Transportation Needs: Not on file  Physical Activity: Not on file  Stress: Not on file  Social Connections: Not on file     Family History: [name] family history includes Heart attack in his father; Hypertension in his father.  ROS:   Please see the history of present illness.   All other systems reviewed and are negative.  Labs/Other Studies Reviewed:     The following studies were reviewed today:  LHC 07/31/22    Ost LAD to Prox LAD lesion is 70% stenosed.   Mid LM to Ost LAD lesion is 70% stenosed.   Ost Cx to Prox Cx lesion is 95% stenosed.   The left ventricular systolic function is normal.   LV end diastolic pressure is normal.   The left ventricular ejection fraction is 55-65% by visual estimate.   1.  Thrombotic, ulcerated severe stenosis of the ostial/proximal circumflex (culprit lesion) 2.  Moderately severe calcific distal left main stenosis of 70% 3.  Moderately severe calcified proximal LAD stenosis of 70% 4.  Mild diffuse nonobstructive plaquing of a large, dominant RCA which supplies a large PDA branch and multiple posterolateral branches 5.  Normal LV systolic function with no regional wall motion abnormalities   Recommendations: Difficult anatomy for PCI due to calcific distal left main and ostial LAD stenosis and severe ostial circumflex stenosis.  Cardiac surgery called and evaluated the patient's cath films with me while he was on the table.  We agree that he is best suited for coronary bypass surgery.  He will be continued on heparin and will have formal cardiac surgical consultation.  At the completion of the procedure, the patient is chest pain-free and his ST segments have normalized.  Echo 07/31/22  1. Left ventricular ejection fraction, by estimation, is 40 to 45%. The  left ventricle has mildly decreased function. The left ventricle  demonstrates regional wall motion abnormalities (see scoring  diagram/findings for description). Left ventricular  diastolic parameters are consistent with Grade I diastolic dysfunction  (impaired relaxation). There is moderate hypokinesis of the left  ventricular, entire anterolateral wall and inferolateral wall.   2. Right ventricular systolic function is normal. The right ventricular  size is normal. Tricuspid regurgitation signal is inadequate for assessing  PA pressure.   3.  Left atrial size was mildly dilated.   4. A small pericardial effusion is present. The pericardial effusion is  anterior to the right ventricle.   5. The mitral valve is normal in structure. No evidence of mitral valve  regurgitation.   6. The aortic valve is tricuspid. There is mild calcification of the  aortic valve. There is mild thickening of the aortic valve. Aortic valve  regurgitation is not visualized. Aortic valve sclerosis/calcification is  present, without any evidence of  aortic stenosis.   7. The inferior vena cava is normal in size with greater than  50%  respiratory variability, suggesting right atrial pressure of 3 mmHg.  Recent Labs: 08/04/2022: Hemoglobin 10.8; Platelets 103 08/06/2022: Magnesium 1.9 10/08/2022: BUN 9; Creatinine, Ser 0.95; Potassium 3.7; Sodium 144 12/07/2022: ALT 11  Recent Lipid Panel    Component Value Date/Time   CHOL 118 12/07/2022 1317   TRIG 104 12/07/2022 1317   HDL 94 12/07/2022 1317   CHOLHDL 1.3 12/07/2022 1317   CHOLHDL 3.7 07/31/2022 0759   VLDL 22 07/31/2022 0759   LDLCALC 6 12/07/2022 1317     Risk Assessment/Calculations:      Physical Exam:    VS:  BP (!) 142/64   Pulse 70   Ht 5\' 9"  (1.753 m)   Wt 153 lb (69.4 kg)   SpO2 98%   BMI 22.59 kg/m     Wt Readings from Last 3 Encounters:  02/11/23 153 lb (69.4 kg)  12/05/22 149 lb 11.1 oz (67.9 kg)  11/12/22 153 lb (69.4 kg)     GEN:  Well nourished, well developed in no acute distress HEENT: Normal NECK: No JVD; No carotid bruits CARDIAC: RRR, no murmurs, rubs, gallops RESPIRATORY:  Clear to auscultation without rales, wheezing or rhonchi  ABDOMEN: Soft, non-tender, non-distended.  MUSCULOSKELETAL:  No edema; No deformity. 2+ pedal pulses, equal bilaterally SKIN: Warm and dry NEUROLOGIC:  Alert and oriented x 3 PSYCHIATRIC:  Normal affect   EKG:  EKG is not ordered today  Diagnoses:    1. Coronary artery disease involving native coronary artery of native  heart without angina pectoris   2. S/P CABG x 2   3. Essential hypertension   4. Atherogenic dyslipidemia   5. Gastroesophageal reflux disease without esophagitis     Assessment and Plan:     CAD s/p CABG without angina: History of STEMI 07/31/22 with LHC that showed thrombotic, ulcerated severe stenosis of ostial/proximal circumflex as culprit lesion as well as moderately severe calcific thick left main stenosis and proximal LAD stenosis. He underwent CABG x 2 11/8 (LIMA-LAD, SVG to RI).  He is feeling well and has completed cardiac rehab. He remains active at home. Has occasional fleeting chest pains in left chest that do not sound consistent with angina.  He denies symptoms concerning for angina.  No problems with medications.  Mild bruising on DAPT, not burdensome to him.  Continue aspirin, Plavix, rosuvastatin.  Acid reflux: Has been taking omeprazole for some time, not sure why or when it was started.  We will discontinue due to interaction with clopidogrel.  I have advised him to wean off over the next week while starting famotidine 20 mg daily. Notify us if symptoms of acid reflux worsen.   Hypertension: BP is mildly elevated today.  Likely secondary to taking prednisone and Mucinex D for sinus infection.  I have asked him to monitor BP consistently once he is off those medications.   Hyperlipidemia LDL goal < 55: LDL 6 on 12/07/22. Very well controlled. Continue rosuvastatin.     Disposition: 6 months with Dr. Excell Seltzer  Medication Adjustments/Labs and Tests Ordered: Current medicines are reviewed at length with the patient today.  Concerns regarding medicines are outlined above.  No orders of the defined types were placed in this encounter.  Meds ordered this encounter  Medications   famotidine (PEPCID) 20 MG tablet    Sig: Take 1 tablet (20 mg total) by mouth daily.    Dispense:  90 tablet    Refill:  3    Patient Instructions  Medication Instructions:  DISCONTINUE  Omeprazole keep decreasing dose for one week than stop.  START Pepcid one (1) tablet by mouth ( 20 mg) daily.  You can take with omeprazole till you stop.   *If you need a refill on your cardiac medications before your next appointment, please call your pharmacy*   Lab Work:  None ordered.  If you have labs (blood work) drawn today and your tests are completely normal, you will receive your results only by: MyChart Message (if you have MyChart) OR A paper copy in the mail If you have any lab test that is abnormal or we need to change your treatment, we will call you to review the results.   Testing/Procedures:  None ordered.   Follow-Up: At [facility], you and your health needs are our priority.  As part of our continuing mission to provide you with exceptional heart care, we have created designated Provider Care Teams.  These Care Teams include your primary Cardiologist (physician) and Advanced Practice Providers (APPs -  Physician Assistants and Nurse Practitioners) who all work together to provide you with the care you need, when you need it.  We recommend signing up for the patient portal called "MyChart".  Sign up information is provided on this After Visit Summary.  MyChart is used to connect with patients for Virtual Visits (Telemedicine).  Patients are able to view lab/test results, encounter notes, upcoming appointments, etc.  Non-urgent messages can be sent to your provider as well.   To learn more about what you can do with MyChart, go to [email or web address].    Your next appointment:   6 month(s)  Provider:   Tonny Bollman, MD     Other Instructions  HOW TO TAKE YOUR BLOOD PRESSURE  Rest 5 minutes before taking your blood pressure. Don't  smoke or drink caffeinated beverages for at least 30 minutes before. Take your blood pressure before (not after) you eat. Sit comfortably with your back supported and both feet on the floor ( don't cross your  legs). Elevate your arm to heart level on a table or a desk. Use the proper sized cuff.  It should fit smoothly and snugly around your bare upper arm.  There should be  Enough room to slip a fingertip under the cuff.  The bottom edge of the cuff should be 1 inch above the crease Of the elbow. Please monitor your blood pressure once daily 2 hours after your am medication. If you blood pressure Consistently remains above 140 (systolic) top number or over 90 ( diastolic) bottom number X 3 days  Consecutively.  Please call our office at [phone] or send Mychart message.     ----Avoid cold medicines with D or DM at the end of them----       Signed, Levi Aland, NP  02/11/2023 4:52 PM    [facility]

## 2023-02-11 ENCOUNTER — Encounter: Payer: Self-pay | Admitting: Nurse Practitioner

## 2023-02-11 ENCOUNTER — Ambulatory Visit: Payer: Medicare Other | Attending: Nurse Practitioner | Admitting: Nurse Practitioner

## 2023-02-11 VITALS — BP 142/64 | HR 70 | Ht 69.0 in | Wt 153.0 lb

## 2023-02-11 DIAGNOSIS — I2510 Atherosclerotic heart disease of native coronary artery without angina pectoris: Secondary | ICD-10-CM | POA: Diagnosis not present

## 2023-02-11 DIAGNOSIS — Z951 Presence of aortocoronary bypass graft: Secondary | ICD-10-CM | POA: Diagnosis not present

## 2023-02-11 DIAGNOSIS — K219 Gastro-esophageal reflux disease without esophagitis: Secondary | ICD-10-CM

## 2023-02-11 DIAGNOSIS — E785 Hyperlipidemia, unspecified: Secondary | ICD-10-CM

## 2023-02-11 DIAGNOSIS — I10 Essential (primary) hypertension: Secondary | ICD-10-CM

## 2023-02-11 MED ORDER — FAMOTIDINE 20 MG PO TABS: 20.0000 mg | ORAL_TABLET | Freq: Every day | ORAL | 3 refills | Status: DC

## 2023-02-11 NOTE — Patient Instructions (Signed)
Medication Instructions:   DISCONTINUE Omeprazole keep decreasing dose for one week than stop.  START Pepcid one (1) tablet by mouth ( 20 mg) daily.  You can take with omeprazole till you stop.   *If you need a refill on your cardiac medications before your next appointment, please call your pharmacy*   Lab Work:  None ordered.  If you have labs (blood work) drawn today and your tests are completely normal, you will receive your results only by: MyChart Message (if you have MyChart) OR A paper copy in the mail If you have any lab test that is abnormal or we need to change your treatment, we will call you to review the results.   Testing/Procedures:  None ordered.   Follow-Up: At [facility], you and your health needs are our priority.  As part of our continuing mission to provide you with exceptional heart care, we have created designated Provider Care Teams.  These Care Teams include your primary Cardiologist (physician) and Advanced Practice Providers (APPs -  Physician Assistants and Nurse Practitioners) who all work together to provide you with the care you need, when you need it.  We recommend signing up for the patient portal called "MyChart".  Sign up information is provided on this After Visit Summary.  MyChart is used to connect with patients for Virtual Visits (Telemedicine).  Patients are able to view lab/test results, encounter notes, upcoming appointments, etc.  Non-urgent messages can be sent to your provider as well.   To learn more about what you can do with MyChart, go to [email or web address].    Your next appointment:   6 month(s)  Provider:   Tonny Bollman, MD     Other Instructions  HOW TO TAKE YOUR BLOOD PRESSURE  Rest 5 minutes before taking your blood pressure. Don't  smoke or drink caffeinated beverages for at least 30 minutes before. Take your blood pressure before (not after) you eat. Sit comfortably with your back supported and both  feet on the floor ( don't cross your legs). Elevate your arm to heart level on a table or a desk. Use the proper sized cuff.  It should fit smoothly and snugly around your bare upper arm.  There should be  Enough room to slip a fingertip under the cuff.  The bottom edge of the cuff should be 1 inch above the crease Of the elbow. Please monitor your blood pressure once daily 2 hours after your am medication. If you blood pressure Consistently remains above 140 (systolic) top number or over 90 ( diastolic) bottom number X 3 days  Consecutively.  Please call our office at [phone] or send Mychart message.     ----Avoid cold medicines with D or DM at the end of them----

## 2023-05-24 ENCOUNTER — Ambulatory Visit: Payer: Medicare Other | Admitting: Hematology

## 2023-05-24 ENCOUNTER — Other Ambulatory Visit: Payer: Medicare Other

## 2023-05-31 ENCOUNTER — Other Ambulatory Visit: Payer: Self-pay

## 2023-05-31 ENCOUNTER — Inpatient Hospital Stay: Payer: Medicare Other | Attending: Hematology

## 2023-05-31 ENCOUNTER — Encounter: Payer: Self-pay | Admitting: Hematology

## 2023-05-31 ENCOUNTER — Inpatient Hospital Stay: Payer: Medicare Other | Admitting: Hematology

## 2023-05-31 VITALS — BP 136/84 | HR 67 | Temp 97.6°F | Resp 18 | Wt 157.4 lb

## 2023-05-31 DIAGNOSIS — Z79899 Other long term (current) drug therapy: Secondary | ICD-10-CM | POA: Diagnosis not present

## 2023-05-31 DIAGNOSIS — C9110 Chronic lymphocytic leukemia of B-cell type not having achieved remission: Secondary | ICD-10-CM | POA: Diagnosis present

## 2023-05-31 DIAGNOSIS — E538 Deficiency of other specified B group vitamins: Secondary | ICD-10-CM | POA: Insufficient documentation

## 2023-05-31 LAB — CBC WITH DIFFERENTIAL (CANCER CENTER ONLY)
Abs Immature Granulocytes: 0.02 10*3/uL (ref 0.00–0.07)
Basophils Absolute: 0.1 10*3/uL (ref 0.0–0.1)
Basophils Relative: 0 %
Eosinophils Absolute: 0.1 10*3/uL (ref 0.0–0.5)
Eosinophils Relative: 1 %
HCT: 40.3 % (ref 39.0–52.0)
Hemoglobin: 13.5 g/dL (ref 13.0–17.0)
Immature Granulocytes: 0 %
Lymphocytes Relative: 77 %
Lymphs Abs: 16.4 10*3/uL — ABNORMAL HIGH (ref 0.7–4.0)
MCH: 30.9 pg (ref 26.0–34.0)
MCHC: 33.5 g/dL (ref 30.0–36.0)
MCV: 92.2 fL (ref 80.0–100.0)
Monocytes Absolute: 0.5 10*3/uL (ref 0.1–1.0)
Monocytes Relative: 3 %
Neutro Abs: 3.9 10*3/uL (ref 1.7–7.7)
Neutrophils Relative %: 19 %
Platelet Count: 152 10*3/uL (ref 150–400)
RBC: 4.37 MIL/uL (ref 4.22–5.81)
RDW: 13.3 % (ref 11.5–15.5)
Smear Review: NORMAL
WBC Count: 21 10*3/uL — ABNORMAL HIGH (ref 4.0–10.5)
nRBC: 0 % (ref 0.0–0.2)

## 2023-05-31 LAB — CMP (CANCER CENTER ONLY)
ALT: 10 U/L (ref 0–44)
AST: 15 U/L (ref 15–41)
Albumin: 4 g/dL (ref 3.5–5.0)
Alkaline Phosphatase: 55 U/L (ref 38–126)
Anion gap: 5 (ref 5–15)
BUN: 12 mg/dL (ref 8–23)
CO2: 29 mmol/L (ref 22–32)
Calcium: 9.1 mg/dL (ref 8.9–10.3)
Chloride: 108 mmol/L (ref 98–111)
Creatinine: 1.04 mg/dL (ref 0.61–1.24)
GFR, Estimated: >60 mL/min (ref >60)
Glucose, Bld: 94 mg/dL (ref 70–99)
Potassium: 4.2 mmol/L (ref 3.5–5.1)
Sodium: 142 mmol/L (ref 135–145)
Total Bilirubin: 0.6 mg/dL (ref 0.3–1.2)
Total Protein: 6.3 g/dL — ABNORMAL LOW (ref 6.5–8.1)

## 2023-05-31 LAB — LACTATE DEHYDROGENASE: LDH: 112 U/L (ref 98–192)

## 2023-06-06 NOTE — Progress Notes (Signed)
HEMATOLOGY/ONCOLOGY CLINIC NOTE  Date of Service: 05/31/2023   Patient Care Team: Barbie Banner, MD as PCP - General (Family Medicine) Tonny Bollman, MD as PCP - Cardiology (Cardiology)  CHIEF COMPLAINTS/PURPOSE OF CONSULTATION:  Follow-up for CLL  HISTORY OF PRESENTING ILLNESS:  Corey [name] is a wonderful 82 y.o. male who has been referred to [place] by Mady Gemma PA-C for evaluation and management of CLL. The pt reports that he is doing well overall.   The pt reports that he has recently had prostatitis and his blood pressure has been abnormally high. Pt has previously been Vitamin B12 deficient, but began taking a Vitamin B12 supplement at the recommendation of Mady Gemma PA-C. He also takes Centrum daily multivitamin.  Pt has been on Omeprazole for 10+ years. Pt notes that he has lost nearly 20 lbs over the course of the last year or so due to eating less because of the pandemic. Pt lives alone and is able to take care of his needs without issue. He lost his mother within the last month. This has contributed greatly to his stress. Pt denies any consitutional symptoms or any new concerns.   Most recent lab results (02/17/2020) of CBC w/diff is as follows: all values are WNL except for WBC at 30.2K, HCT at 52.1, PLT at 124K, Lymphs Abs at 26.0K. 02/17/2020 Vitamin B12 at 315  On review of systems, pt reports stress and denies fatigue, rash, fevers, chills, night sweats, rapid weight loss, new lumps/bumps, abnormal bruising/bleeding and any other symptoms.   On PMHx the pt reports Prostatitis, HTN, HLD, Appendectomy, CLL On Social Hx the pt reports that he is a non-smoker.   INTERVAL HISTORY  Corey [name] is a wonderful 82 y.o. male who is here for f/u of CLL.  She was was last seen in the clinic about a year ago.  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 She notes that he is recovering well from that. Notes no fevers no chills no unexplained  night sweats.  No new lumps or bumps.  No new shortness of breath abdominal pain or distention. Labs done today were reviewed in detail with him.  MEDICAL HISTORY:  Past Medical History:  Diagnosis Date   CLL (chronic lymphocytic leukemia) (HCC)    Coronary artery disease    Diverticulitis    Gout    Hypercholesteremia    Hypertension    Prostate disorder    Acute renal injury (HCC) 06/20/2015   CLL (chronic lymphocytic leukemia) (HCC)   SURGICAL HISTORY: Past Surgical History:  Procedure Laterality Date   APPENDECTOMY     CARDIAC CATHETERIZATION     CORONARY ARTERY BYPASS GRAFT N/A 08/01/2022   Procedure: 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 USING LEFT INTERNAL MAMMARY AND RIGHT SAPHENOUS LEG VEIN HARVESTED ENDOSCOPICALLY;  Surgeon: Loreli Slot, MD;  Location: [facility] OR;  Service: Open Heart Surgery;  Laterality: N/A;   LEFT HEART CATH AND CORONARY ANGIOGRAPHY N/A 07/31/2022   Procedure: LEFT HEART CATH AND CORONARY ANGIOGRAPHY;  Surgeon: Tonny Bollman, MD;  Location: [facility] INVASIVE CV LAB;  Service: Cardiovascular;  Laterality: N/A;   TEE WITHOUT CARDIOVERSION N/A 08/01/2022   Procedure: TRANSESOPHAGEAL ECHOCARDIOGRAM (TEE);  Surgeon: Loreli Slot, MD;  Location: [facility] OR;  Service: Open Heart Surgery;  Laterality: N/A;    SOCIAL HISTORY: Social History   Socioeconomic History   Marital status: Divorced    Spouse name: Not on file  Number of children: Not on file   Years of education: 12   Highest education level: Some college, no degree  Occupational History   Occupation: Retired  Tobacco Use   Smoking status: Never   Smokeless tobacco: Never  Vaping Use   Vaping status: Never Used  Substance and Sexual Activity   Alcohol use: Yes    Comment: occasional   Drug use: No   Sexual activity: Not Currently  Other Topics Concern   Not on file  Social History Narrative   Not on file   Social Determinants of Health   Financial Resource Strain:  Low Risk  (02/21/2021)   [facility] prior to 11/24/2022., [facility] visits prior to 11/24/2022.   Overall Financial Resource Strain (CARDIA)    Difficulty of Paying Living Expenses: Not very hard  Food Insecurity: Low Risk  (10/03/2022)   [facility]   Hunger Vital Sign    Worried About Running Out of Food in the Last Year: Never true    Within the past 12 months, the food you bought just didn't last and you didn't have money to get more: Not on file  Transportation Needs: No Transportation Needs (05/27/2023)   Received from [employer]    In the past 12 months, has lack of reliable transportation kept you from medical appointments, meetings, work or from getting things needed for daily living? : No  Physical Activity: Not on file  Stress: Not on file  Social Connections: Not on file  Intimate Partner Violence: Not on file    FAMILY HISTORY: Family History  Problem Relation Age of Onset   Hypertension Father    Heart attack Father     ALLERGIES:  has No Known Allergies.  MEDICATIONS:  Current Outpatient Medications  Medication Sig Dispense Refill   allopurinol (ZYLOPRIM) 300 MG tablet Take 300 mg by mouth daily.     aspirin EC 81 MG tablet Take 1 tablet (81 mg total) by mouth daily.     clopidogrel (PLAVIX) 75 MG tablet Take 1 tablet (75 mg total) by mouth daily. 30 tablet 3   Cyanocobalamin (VITAMIN B 12) 500 MCG TABS Take 1 tablet by mouth daily.     famotidine (PEPCID) 20 MG tablet Take 1 tablet (20 mg total) by mouth daily. 90 tablet 3   finasteride (PROSCAR) 5 MG tablet Take 5 mg by mouth daily.     melatonin 3 MG TABS tablet Take 3 mg by mouth at bedtime. Per patient will start taking     rosuvastatin (CRESTOR) 40 MG tablet Take 1 tablet (40 mg total) by mouth daily. 90 tablet 3   Tamsulosin HCl (FLOMAX) 0.4 MG CAPS Take 0.4 mg by mouth in the morning and at  bedtime.     No current facility-administered medications for this visit.    REVIEW OF SYSTEMS:   10 Point review of Systems was done is negative except as noted above. PHYSICAL EXAMINATION: ECOG PERFORMANCE STATUS: 0 - Asymptomatic  . Vitals:   05/31/23 1254  BP: 136/84  Pulse: 67  Resp: 18  Temp: 97.6 F (36.4 C)  SpO2: 100%    Filed Weights   05/31/23 1254  Weight: 157 lb 6.4 oz (71.4 kg)    .Body mass index is 23.24 kg/m.   NAD GENERAL:alert, in no acute distress and comfortable SKIN: no acute rashes, no significant lesions EYES: conjunctiva are pink  and non-injected, sclera anicteric OROPHARYNX: MMM, no exudates, no oropharyngeal erythema or ulceration NECK: supple, no JVD LYMPH:  no palpable lymphadenopathy in the cervical, axillary or inguinal regions LUNGS: clear to auscultation b/l with normal respiratory effort HEART: regular rate & rhythm ABDOMEN:  normoactive bowel sounds , non tender, not distended. Extremity: no pedal edema PSYCH: alert & oriented x 3 with fluent speech NEURO: no focal motor/sensory deficits   LABORATORY DATA:  I have reviewed the data as listed  .    Latest Ref Rng & Units 05/31/2023   12:17 PM 08/04/2022   12:53 AM 08/03/2022    4:12 AM  CBC  WBC 4.0 - 10.5 K/uL 21.0  27.4  30.8   Hemoglobin 13.0 - 17.0 g/dL 56.4  33.2  95.1   Hematocrit 39.0 - 52.0 % 40.3  31.9  33.8   Platelets 150 - 400 K/uL 152  103  101     .    Latest Ref Rng & Units 05/31/2023   12:17 PM 12/07/2022    1:17 PM 10/08/2022   10:42 AM  CMP  Glucose 70 - 99 mg/dL 94   93   BUN 8 - 23 mg/dL 12   9   Creatinine 8.84 - 1.24 mg/dL 1.66   0.63   Sodium 016 - 145 mmol/L 142   144   Potassium 3.5 - 5.1 mmol/L 4.2   3.7   Chloride 98 - 111 mmol/L 108   105   CO2 22 - 32 mmol/L 29   28   Calcium 8.9 - 10.3 mg/dL 9.1   9.2   Total Protein 6.5 - 8.1 g/dL 6.3   5.7   Total Bilirubin 0.3 - 1.2 mg/dL 0.6   1.0   Alkaline Phos 38 - 126 U/L 55   54   AST 15 -  41 U/L 15   12   ALT 0 - 44 U/L 10  11  8     . Lab Results  Component Value Date   LDH 112 05/31/2023   \          RADIOGRAPHIC STUDIES: I have personally reviewed the radiological images as listed and agreed with the findings in the report. No results found.  ASSESSMENT & PLAN:   82 yo with   1) Stage 0 Chronic lymphocytic Leukemia  No overt signs of symptomatic disease progression at this time. CLL FISH with mono and bialleleic 13q deletion.  PLAN: -Discussed pt labwork today, 05/31/2023; -CBC shows his WBC count has come down to 21k, high in the mid 30k range.  He has no anemia or thrombocytopenia. CMP stable LDH within normal limits Patient has no new symptoms suggestive of CLL progression at this time. -There is no clear indication to initiate treatment for his CLL at this time. -Recommended patient stay up to speed with age-appropriate vaccinations. -Was recommended to continue his vitamin D at 2000 units daily and also his sublingual B12.  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  Resolved now   FOLLOW UP: RTC with Dr Candise Che with labs in 12 months  The total time spent in the appointment was 20 minutes*.  All of the patient's questions were answered with apparent satisfaction. The patient knows to call the clinic with any problems, questions or concerns.   Wyvonnia Lora MD MS AAHIVMS [facility] Hematology/Oncology Physician [facility]  .*Total Encounter Time as defined by the Centers for Medicare and Medicaid Services includes, in addition to the face-to-face time of a patient visit (documented in the note above) non-face-to-face time: obtaining and reviewing outside history, ordering and reviewing medications, tests or procedures, care coordination (communications with other health care professionals or caregivers) and  documentation in the medical record.

## 2023-06-07 ENCOUNTER — Encounter: Payer: Self-pay | Admitting: Cardiovascular Disease

## 2023-06-26 ENCOUNTER — Encounter: Payer: Self-pay | Admitting: Cardiovascular Disease

## 2023-06-26 ENCOUNTER — Ambulatory Visit: Payer: Medicare Other | Attending: Cardiovascular Disease | Admitting: Cardiovascular Disease

## 2023-06-26 VITALS — BP 142/70 | HR 71 | Ht 69.0 in | Wt 157.8 lb

## 2023-06-26 DIAGNOSIS — I10 Essential (primary) hypertension: Secondary | ICD-10-CM | POA: Diagnosis not present

## 2023-06-26 DIAGNOSIS — I5020 Unspecified systolic (congestive) heart failure: Secondary | ICD-10-CM | POA: Diagnosis not present

## 2023-06-26 DIAGNOSIS — I2510 Atherosclerotic heart disease of native coronary artery without angina pectoris: Secondary | ICD-10-CM

## 2023-06-26 MED ORDER — OMEPRAZOLE 40 MG PO CPDR: 40.0000 mg | DELAYED_RELEASE_CAPSULE | ORAL | Status: AC | PRN

## 2023-06-26 NOTE — Patient Instructions (Signed)
Medication Instructions:  Your physician has recommended you make the following change in your medication:  1) STOP taking Plavix (clopidogrel) 2) RESTART taking omeprazole as needed   *If you need a refill on your cardiac medications before your next appointment, please call your pharmacy*  Testing/Procedures: Your physician has requested that you have an echocardiogram. Echocardiography is a painless test that uses sound waves to create images of your heart. It provides your doctor with information about the size and shape of your heart and how well your heart's chambers and valves are working. This procedure takes approximately one hour. There are no restrictions for this procedure. Please do NOT wear cologne, perfume, aftershave, or lotions (deodorant is allowed). Please arrive 15 minutes prior to your appointment time.  Follow-Up: At [facility], you and your health needs are our priority.  As part of our continuing mission to provide you with exceptional heart care, we have created designated Provider Care Teams.  These Care Teams include your primary Cardiologist (physician) and Advanced Practice Providers (APPs -  Physician Assistants and Nurse Practitioners) who all work together to provide you with the care you need, when you need it.  Your next appointment:   6 months  Provider:   Jari Favre, PA-C, Ern est Louanne Skye, NP, Eligha Bridegroom, NP, Tereso Newcomer, PA-C, or Perlie Gold, PA-C     Then, Tonny Bollman, MD will plan to see you again in 1 year(s).

## 2023-06-26 NOTE — Progress Notes (Signed)
Cardiology Office Note:    Date:  06/26/2023   ID:  Corey [name], Corey [name] June 16, 1941, MRN [id number]  PCP:  Barbie Banner, MD   [facility] Providers Cardiologist:  Tonny Bollman, MD     Referring MD: Barbie Banner, MD   Chief Complaint  Patient presents with   Coronary Artery Disease    History of Present Illness:    Corey [name] is a 82 y.o. male with a hx of CAD, hypertension, and hyperlipidemia, presenting for follow-up evaluation.  The patient presented with a STEMI in November 2023, found to have severe left main and proximal multivessel coronary artery disease.  He underwent emergent two-vessel CABG with a LIMA to LAD and saphenous vein graft to ramus intermedius.  The patient completed cardiac rehabilitation after surgery.  His beta-blocker was ultimately discontinued because of marked sinus bradycardia.  Most recent assessment of LVEF was 40 to 45%.  The patient is here alone today.  He is doing fairly well.  He remains functionally independent.  He does yard work, does all the cooking, and takes care of himself.  He has some exertional dyspnea with moderate level activities like working in his yard.  No orthopnea or PND.  No chest pain or pressure.  He has had some problems with acid reflux since he has been unable to take omeprazole due to the clopidogrel interaction.  He has been using Pepcid but it does not work as well.  Past Medical History:  Diagnosis Date   CLL (chronic lymphocytic leukemia) (HCC)    Coronary artery disease    Diverticulitis    Gout    Hypercholesteremia    Hypertension    Prostate disorder     Past Surgical History:  Procedure Laterality Date   APPENDECTOMY     CARDIAC CATHETERIZATION     CORONARY ARTERY BYPASS GRAFT N/A 08/01/2022   Procedure: 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 USING LEFT INTERNAL MAMMARY AND RIGHT SAPHENOUS LEG VEIN HARVESTED ENDOSCOPICALLY;  Surgeon: Loreli Slot, MD;  Location: [facility] OR;   Service: Open Heart Surgery;  Laterality: N/A;   LEFT HEART CATH AND CORONARY ANGIOGRAPHY N/A 07/31/2022   Procedure: LEFT HEART CATH AND CORONARY ANGIOGRAPHY;  Surgeon: Tonny Bollman, MD;  Location: [facility] INVASIVE CV LAB;  Service: Cardiovascular;  Laterality: N/A;   TEE WITHOUT CARDIOVERSION N/A 08/01/2022   Procedure: TRANSESOPHAGEAL ECHOCARDIOGRAM (TEE);  Surgeon: Loreli Slot, MD;  Location: [facility] OR;  Service: Open Heart Surgery;  Laterality: N/A;    Current Medications: Current Meds  Medication Sig   allopurinol (ZYLOPRIM) 300 MG tablet Take 300 mg by mouth daily.   aspirin EC 81 MG tablet Take 1 tablet (81 mg total) by mouth daily.   Cyanocobalamin (VITAMIN B 12) 500 MCG TABS Take 1 tablet by mouth daily.   famotidine (PEPCID) 20 MG tablet Take 1 tablet (20 mg total) by mouth daily.   finasteride (PROSCAR) 5 MG tablet Take 5 mg by mouth daily.   melatonin 3 MG TABS tablet Take 3 mg by mouth at bedtime as needed.   omeprazole (PRILOSEC) 40 MG capsule Take 1 capsule (40 mg total) by mouth as needed.   rosuvastatin (CRESTOR) 40 MG tablet Take 1 tablet (40 mg total) by mouth daily.   Tamsulosin HCl (FLOMAX) 0.4 MG CAPS Take 0.4 mg by mouth in the morning and at bedtime.   [DISCONTINUED] clopidogrel (PLAVIX) 75 MG tablet Take 1 tablet (75 mg total) by mouth daily.  Allergies:   Patient has no known allergies.   Social History   Socioeconomic History   Marital status: Divorced    Spouse name: Not on file   Number of children: Not on file   Years of education: 12   Highest education level: Some college, no degree  Occupational History   Occupation: Retired  Tobacco Use   Smoking status: Never   Smokeless tobacco: Never  Vaping Use   Vaping status: Never Used  Substance and Sexual Activity   Alcohol use: Yes    Comment: occasional   Drug use: No   Sexual activity: Not Currently  Other Topics Concern   Not on file  Social History Narrative   Not on file    Social Determinants of Health   Financial Resource Strain: Low Risk  (02/21/2021)   [facility] visits prior to 11/24/2022., [facility] visits prior to 11/24/2022.   Overall Financial Resource Strain (CARDIA)    Difficulty of Paying Living Expenses: Not very hard  Food Insecurity: Low Risk  (10/03/2022)   [facility]   Hunger Vital Sign    Worried About Running Out of Food in the Last Year: Never true    Within the past 12 months, the food you bought just didn't last and you didn't have money to get more: Not on file  Transportation Needs: No Transportation Needs (05/27/2023)   Received from [employer]    In the past 12 months, has lack of reliable transportation kept you from medical appointments, meetings, work or from getting things needed for daily living? : No  Physical Activity: Not on file  Stress: Not on file  Social Connections: Not on file     Family History: [name] family history includes Heart attack in his father; Hypertension in his father.  ROS:   Please see the history of present illness.    All other systems reviewed and are negative.  EKGs/Labs/Other Studies Reviewed:    The following studies were reviewed today: 2D echo as outlined above with LVEF 40 to 45%.  This is from November 2023.      Recent Labs: 08/06/2022: Magnesium 1.9 05/31/2023: ALT 10; BUN 12; Creatinine 1.04; Hemoglobin 13.5; Platelet Count 152; Potassium 4.2; Sodium 142  Recent Lipid Panel    Component Value Date/Time   CHOL 118 12/07/2022 1317   TRIG 104 12/07/2022 1317   HDL 94 12/07/2022 1317   CHOLHDL 1.3 12/07/2022 1317   CHOLHDL 3.7 07/31/2022 0759   VLDL 22 07/31/2022 0759   LDLCALC 6 12/07/2022 1317          Physical Exam:    VS:  BP (!) 142/70   Pulse 71   Ht 5\' 9"  (1.753 m)   Wt 157 lb 12.8 oz (71.6 kg)   SpO2 98%   BMI 23.30 kg/m     Wt Readings from  Last 3 Encounters:  06/26/23 157 lb 12.8 oz (71.6 kg)  05/31/23 157 lb 6.4 oz (71.4 kg)  02/11/23 153 lb (69.4 kg)     GEN:  Well nourished, well developed in no acute distress HEENT: Normal NECK: No JVD; No carotid bruits LYMPHATICS: No lymphadenopathy CARDIAC: RRR, 2/6 systolic murmur at the LV apex RESPIRATORY:  Clear to auscultation without rales, wheezing or rhonchi  ABDOMEN: Soft, non-tender, non-distended MUSCULOSKELETAL:  No edema; No deformity  SKIN: Warm and dry NEUROLOGIC:  Alert and oriented x  3 PSYCHIATRIC:  Normal affect   ASSESSMENT:    1. Benign essential HTN   2. HFrEF (heart failure with reduced ejection fraction) (HCC)   3. Coronary artery disease involving native coronary artery of native heart without angina pectoris    PLAN:    In order of problems listed above:  Not currently requiring medical therapy.  Continue healthy lifestyle.  Patient physically active. Need to update LVEF.  Patient has been revascularized, initially with LVEF 40 to 45% at the time of his MI.  If he continues to have reduced ejection fraction, especially in light of NYHA functional class II symptoms, will need to initiate medical therapy.  He has been intolerant to beta-blockers due to marked sinus bradycardia.  Would start him on an ACE/ARB or Entresto pending assessment of LVEF.  Will follow-up with him after his echo is done.  No evidence of volume overload on exam.  GDMT can be further determined after his echo is completed. Clinically stable.  He is approaching 1 year out from multivessel CABG.  The patient is having more reflux due to inability to take omeprazole.  This is worked well for him in the past.  As he is close to 1 year out from CABG and ACS, I think he can go ahead and discontinue clopidogrel, remain on aspirin for antiplatelet therapy.  And start back on omeprazole as needed for GERD symptoms.  2D echo pending as above.  Follow-up APP 6 months and I will see him back in 1  year.  Patient treated with a high intensity statin drug.  Cholesterol is 118, LDL at goal of less than 55 mg/dL.           Medication Adjustments/Labs and Tests Ordered: Current medicines are reviewed at length with the patient today.  Concerns regarding medicines are outlined above.  Orders Placed This Encounter  Procedures   ECHOCARDIOGRAM COMPLETE   Meds ordered this encounter  Medications   omeprazole (PRILOSEC) 40 MG capsule    Sig: Take 1 capsule (40 mg total) by mouth as needed.    Patient Instructions  Medication Instructions:  Your physician has recommended you make the following change in your medication:  1) STOP taking Plavix (clopidogrel) 2) RESTART taking omeprazole as needed   *If you need a refill on your cardiac medications before your next appointment, please call your pharmacy*  Testing/Procedures: Your physician has requested that you have an echocardiogram. Echocardiography is a painless test that uses sound waves to create images of your heart. It provides your doctor with information about the size and shape of your heart and how well your heart's chambers and valves are working. This procedure takes approximately one hour. There are no restrictions for this procedure. Please do NOT wear cologne, perfume, aftershave, or lotions (deodorant is allowed). Please arrive 15 minutes prior to your appointment time.  Follow-Up: At [facility], you and your health needs are our priority.  As part of our continuing mission to provide you with exceptional heart care, we have created designated Provider Care Teams.  These Care Teams include your primary Cardiologist (physician) and Advanced Practice Providers (APPs -  Physician Assistants and Nurse Practitioners) who all work together to provide you with the care you need, when you need it.  Your next appointment:   6 months  Provider:   Jari Favre, PA-C, Ern est Louanne Skye, NP, Eligha Bridegroom, NP, Tereso Newcomer, PA-C, or Perlie Gold, PA-C     Then, Casimiro Needle  Excell Seltzer, MD will plan to see you again in 1 year(s).     Signed, Tonny Bollman, MD  06/26/2023 4:20 PM    [facility]

## 2023-07-11 ENCOUNTER — Ambulatory Visit (HOSPITAL_COMMUNITY): Payer: Medicare Other | Attending: Cardiology

## 2023-07-11 DIAGNOSIS — I5020 Unspecified systolic (congestive) heart failure: Secondary | ICD-10-CM | POA: Insufficient documentation

## 2023-07-11 DIAGNOSIS — I10 Essential (primary) hypertension: Secondary | ICD-10-CM | POA: Insufficient documentation

## 2023-07-11 LAB — ECHOCARDIOGRAM COMPLETE
Area-P 1/2: 3.2 cm2
S' Lateral: 2.6 cm

## 2023-07-12 ENCOUNTER — Encounter: Payer: Self-pay | Admitting: Cardiovascular Disease

## 2023-07-24 ENCOUNTER — Telehealth: Payer: Self-pay | Admitting: Cardiovascular Disease

## 2023-07-24 NOTE — Telephone Encounter (Signed)
Pt c/o BP issue: STAT if pt c/o blurred vision, one-sided weakness or slurred speech  1. What are your last 5 BP readings? 145/80 pulse 61, 158/89 pulse 59-  he says this is high for him  2. Are you having any other symptoms (ex. Dizziness, headache, blurred vision, passed out)? Says, he feels dizzy, feel off balance, neck hurts and stiff  3. What is your BP issue? High blood pressure

## 2023-07-24 NOTE — Telephone Encounter (Signed)
Pt called to report that he has been dizzy and off balance all day today.. he denies chest pain, SOB, palpitations... no weakness on one side and no slurred speech.. he has been having stiffness in his neck but no headache.   145/80 pulse 61, 158/89 pulse 59   He has been eating and drinking well... I asked him to possibly have someone drive him to the Urgent Care near his home to be evaluated.. not sure if he can get anyone to drive him but advised he should not drive... he will plan to go this evening if he decides to go but will also continue to monitor his symptoms and if anything changes or worsens he will call EMS.

## 2023-07-25 NOTE — Telephone Encounter (Signed)
Vitals reviewed and look ok. Seems like he should start with his PCP as symptoms are not classic for cardiac problem.

## 2023-07-26 ENCOUNTER — Encounter: Payer: Self-pay | Admitting: Cardiovascular Disease

## 2023-07-26 NOTE — Telephone Encounter (Signed)
Left detailed voicemail per DPR informing pt's of Cooper's comments.

## 2023-08-05 ENCOUNTER — Ambulatory Visit: Payer: Medicare Other | Admitting: Cardiovascular Disease

## 2023-09-14 ENCOUNTER — Other Ambulatory Visit: Payer: Self-pay | Admitting: Nurse Practitioner

## 2023-11-04 ENCOUNTER — Telehealth: Payer: Self-pay | Admitting: Hematology

## 2023-11-04 NOTE — Telephone Encounter (Signed)
 Left patient a vm regarding upcoming appointment

## 2023-11-12 ENCOUNTER — Inpatient Hospital Stay: Payer: Medicare Other | Attending: Hematology | Admitting: Hematology

## 2023-11-12 ENCOUNTER — Inpatient Hospital Stay: Payer: Medicare Other

## 2023-11-12 VITALS — BP 146/63 | HR 64 | Temp 97.9°F | Resp 16 | Wt 159.4 lb

## 2023-11-12 DIAGNOSIS — R590 Localized enlarged lymph nodes: Secondary | ICD-10-CM

## 2023-11-12 DIAGNOSIS — E538 Deficiency of other specified B group vitamins: Secondary | ICD-10-CM | POA: Insufficient documentation

## 2023-11-12 DIAGNOSIS — C9110 Chronic lymphocytic leukemia of B-cell type not having achieved remission: Secondary | ICD-10-CM

## 2023-11-12 DIAGNOSIS — Z79899 Other long term (current) drug therapy: Secondary | ICD-10-CM | POA: Insufficient documentation

## 2023-11-12 LAB — CMP (CANCER CENTER ONLY)
ALT: 14 U/L (ref 0–44)
AST: 20 U/L (ref 15–41)
Albumin: 4.2 g/dL (ref 3.5–5.0)
Alkaline Phosphatase: 50 U/L (ref 38–126)
Anion gap: 5 (ref 5–15)
BUN: 11 mg/dL (ref 8–23)
CO2: 29 mmol/L (ref 22–32)
Calcium: 8.9 mg/dL (ref 8.9–10.3)
Chloride: 107 mmol/L (ref 98–111)
Creatinine: 1.03 mg/dL (ref 0.61–1.24)
GFR, Estimated: >60 mL/min (ref >60)
Glucose, Bld: 97 mg/dL (ref 70–99)
Potassium: 3.9 mmol/L (ref 3.5–5.1)
Sodium: 141 mmol/L (ref 135–145)
Total Bilirubin: 1 mg/dL (ref 0.0–1.2)
Total Protein: 6 g/dL — ABNORMAL LOW (ref 6.5–8.1)

## 2023-11-12 LAB — CBC WITH DIFFERENTIAL (CANCER CENTER ONLY)
Abs Immature Granulocytes: 0.05 10*3/uL (ref 0.00–0.07)
Basophils Absolute: 0.1 10*3/uL (ref 0.0–0.1)
Basophils Relative: 0 %
Eosinophils Absolute: 0.2 10*3/uL (ref 0.0–0.5)
Eosinophils Relative: 1 %
HCT: 44.1 % (ref 39.0–52.0)
Hemoglobin: 14.5 g/dL (ref 13.0–17.0)
Immature Granulocytes: 0 %
Lymphocytes Relative: 83 %
Lymphs Abs: 24.8 10*3/uL — ABNORMAL HIGH (ref 0.7–4.0)
MCH: 30.7 pg (ref 26.0–34.0)
MCHC: 32.9 g/dL (ref 30.0–36.0)
MCV: 93.4 fL (ref 80.0–100.0)
Monocytes Absolute: 0.7 10*3/uL (ref 0.1–1.0)
Monocytes Relative: 2 %
Neutro Abs: 4.2 10*3/uL (ref 1.7–7.7)
Neutrophils Relative %: 14 %
Platelet Count: 117 10*3/uL — ABNORMAL LOW (ref 150–400)
RBC: 4.72 MIL/uL (ref 4.22–5.81)
RDW: 13.7 % (ref 11.5–15.5)
Smear Review: NORMAL
WBC Count: 30.1 10*3/uL — ABNORMAL HIGH (ref 4.0–10.5)
nRBC: 0 % (ref 0.0–0.2)

## 2023-11-12 LAB — RETICULOCYTES
Immature Retic Fract: 8.7 % (ref 2.3–15.9)
RBC.: 4.64 MIL/uL (ref 4.22–5.81)
Retic Count, Absolute: 64.5 10*3/uL (ref 19.0–186.0)
Retic Ct Pct: 1.4 % (ref 0.4–3.1)

## 2023-11-12 LAB — LACTATE DEHYDROGENASE: LDH: 125 U/L (ref 98–192)

## 2023-11-12 NOTE — Progress Notes (Signed)
 HEMATOLOGY/ONCOLOGY CLINIC NOTE  Date of Service: 11/12/23    Patient Care Team: Barbie Banner, MD as PCP - General (Family Medicine) Tonny Bollman, MD as PCP - Cardiology (Cardiology)  CHIEF COMPLAINTS/PURPOSE OF CONSULTATION:  Follow-up for CLL  HISTORY OF PRESENTING ILLNESS:  Corey [name] is a wonderful 83 y.o. male who has been referred to [place] by Mady Gemma PA-C for evaluation and management of CLL. The pt reports that he is doing well overall.   The pt reports that he has recently had prostatitis and his blood pressure has been abnormally high. Pt has previously been Vitamin B12 deficient, but began taking a Vitamin B12 supplement at the recommendation of Mady Gemma PA-C. He also takes Centrum daily multivitamin.  Pt has been on Omeprazole for 10+ years. Pt notes that he has lost nearly 20 lbs over the course of the last year or so due to eating less because of the pandemic. Pt lives alone and is able to take care of his needs without issue. He lost his mother within the last month. This has contributed greatly to his stress. Pt denies any consitutional symptoms or any new concerns.   Most recent lab results (02/17/2020) of CBC w/diff is as follows: all values are WNL except for WBC at 30.2K, HCT at 52.1, PLT at 124K, Lymphs Abs at 26.0K. 02/17/2020 Vitamin B12 at 315  On review of systems, pt reports stress and denies fatigue, rash, fevers, chills, night sweats, rapid weight loss, new lumps/bumps, abnormal bruising/bleeding and any other symptoms.   On PMHx the pt reports Prostatitis, HTN, HLD, Appendectomy, CLL On Social Hx the pt reports that he is a non-smoker.   INTERVAL HISTORY  Corey [name] is a wonderful 83 y.o. male who is here for f/u of CLL.    Patient was last seen by me on 05/31/2023 and she was doing well overall.   Patient notes he has been doing fairly well since our last visit. He is visiting [place] earlier than he was scheduled since he had CT  scan with his PCP which showed bulky lymphadenopathy in his neck.   Patient complains of enlarged lymph nodes on his left neck, which he first noticed around 2-3 weeks ago. He notes that these lymph nodes appeared suddenly. He notes that his lymph nodes have reduced in size. He denies biopsy or taking steroids.  He does report of chronic congestion.   He denies any new infection issues, fever, chills, night sweats, unexpected weight loss, back pain, chest pain, abdominal pain, or leg swelling. He denies any enlarged lymph nodes anywhere else other than neck lymph nodes.   Patient notes that his appetite is not stable since his Bypass surgery.   MEDICAL HISTORY:  Past Medical History:  Diagnosis Date   CLL (chronic lymphocytic leukemia) (HCC)    Coronary artery disease    Diverticulitis    Gout    Hypercholesteremia    Hypertension    Prostate disorder    Acute renal injury (HCC) 06/20/2015   CLL (chronic lymphocytic leukemia) (HCC)   SURGICAL HISTORY: Past Surgical History:  Procedure Laterality Date   APPENDECTOMY     CARDIAC CATHETERIZATION     CORONARY ARTERY BYPASS GRAFT N/A 08/01/2022   Procedure: 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 USING LEFT INTERNAL MAMMARY AND RIGHT SAPHENOUS LEG VEIN HARVESTED ENDOSCOPICALLY;  Surgeon: Loreli Slot, MD;  Location: [facility] OR;  Service: Open Heart Surgery;  Laterality: N/A;   LEFT  HEART CATH AND CORONARY ANGIOGRAPHY N/A 07/31/2022   Procedure: LEFT HEART CATH AND CORONARY ANGIOGRAPHY;  Surgeon: Tonny Bollman, MD;  Location: [facility] INVASIVE CV LAB;  Service: Cardiovascular;  Laterality: N/A;   TEE WITHOUT CARDIOVERSION N/A 08/01/2022   Procedure: TRANSESOPHAGEAL ECHOCARDIOGRAM (TEE);  Surgeon: Loreli Slot, MD;  Location: [facility] OR;  Service: Open Heart Surgery;  Laterality: N/A;    SOCIAL HISTORY: Social History   Socioeconomic History   Marital status: Divorced    Spouse name: Not on file   Number of children: Not  on file   Years of education: 12   Highest education level: Some college, no degree  Occupational History   Occupation: Retired  Tobacco Use   Smoking status: Never   Smokeless tobacco: Never  Vaping Use   Vaping status: Never Used  Substance and Sexual Activity   Alcohol use: Yes    Comment: occasional   Drug use: No   Sexual activity: Not Currently  Other Topics Concern   Not on file  Social History Narrative   Not on file   Social Drivers of Health   Financial Resource Strain: Low Risk  (02/21/2021)   Received from [facility] visits prior to 11/24/2022., [facility] visits prior to 11/24/2022.   Overall Financial Resource Strain (CARDIA)    Difficulty of Paying Living Expenses: Not very hard  Food Insecurity: Low Risk  (08/28/2023)   [facility]   Hunger Vital Sign    Worried About Running Out of Food in the Last Year: Never true    Ran Out of Food in the Last Year: Never true  Transportation Needs: No Transportation Needs (08/28/2023)   Received from [employer]    In the past 12 months, has lack of reliable transportation kept you from medical appointments, meetings, work or from getting things needed for daily living? : No  Physical Activity: Not on file  Stress: Not on file  Social Connections: Not on file  Intimate Partner Violence: Not on file    FAMILY HISTORY: Family History  Problem Relation Age of Onset   Hypertension Father    Heart attack Father     ALLERGIES:  has no known allergies.  MEDICATIONS:  Current Outpatient Medications  Medication Sig Dispense Refill   allopurinol (ZYLOPRIM) 300 MG tablet Take 300 mg by mouth daily.     aspirin EC 81 MG tablet Take 1 tablet (81 mg total) by mouth daily.     Cyanocobalamin (VITAMIN B 12) 500 MCG TABS Take 1 tablet by mouth daily.     famotidine (PEPCID) 20 MG tablet Take 1 tablet (20 mg total) by mouth daily. 90 tablet 3    finasteride (PROSCAR) 5 MG tablet Take 5 mg by mouth daily.     melatonin 3 MG TABS tablet Take 3 mg by mouth at bedtime as needed.     omeprazole (PRILOSEC) 40 MG capsule Take 1 capsule (40 mg total) by mouth as needed.     rosuvastatin (CRESTOR) 40 MG tablet TAKE 1 TABLET BY MOUTH EVERY DAY 90 tablet 3   Tamsulosin HCl (FLOMAX) 0.4 MG CAPS Take 0.4 mg by mouth in the morning and at bedtime.     No current facility-administered medications for this visit.    REVIEW OF SYSTEMS:   10 Point review of Systems was done is negative except as noted above. PHYSICAL EXAMINATION: ECOG PERFORMANCE STATUS: 0 - Asymptomatic  .  Vitals:   11/12/23 1505  BP: (!) 146/63  Pulse: 64  Resp: 16  Temp: 97.9 F (36.6 C)  SpO2: 100%   Filed Weights   11/12/23 1505  Weight: 159 lb 6.4 oz (72.3 kg)   .Body mass index is 23.54 kg/m.   NAD GENERAL:alert, in no acute distress and comfortable SKIN: no acute rashes, no significant lesions EYES: conjunctiva are pink and non-injected, sclera anicteric OROPHARYNX: MMM, no exudates, no oropharyngeal erythema or ulceration NECK: supple, no JVD LYMPH:  no palpable lymphadenopathy in the cervical, axillary or inguinal regions LUNGS: clear to auscultation b/l with normal respiratory effort HEART: regular rate & rhythm ABDOMEN:  normoactive bowel sounds , non tender, not distended. Extremity: no pedal edema PSYCH: alert & oriented x 3 with fluent speech NEURO: no focal motor/sensory deficits   LABORATORY DATA:  I have reviewed the data as listed  .    Latest Ref Rng & Units 11/12/2023    3:42 PM 05/31/2023   12:17 PM 08/04/2022   12:53 AM  CBC  WBC 4.0 - 10.5 K/uL 30.1  21.0  27.4   Hemoglobin 13.0 - 17.0 g/dL 16.1  09.6  04.5   Hematocrit 39.0 - 52.0 % 44.1  40.3  31.9   Platelets 150 - 400 K/uL 117  152  103     .    Latest Ref Rng & Units 11/12/2023    3:42 PM 05/31/2023   12:17 PM 12/07/2022    1:17 PM  CMP  Glucose 70 - 99 mg/dL 97  94     BUN 8 - 23 mg/dL 11  12    Creatinine 4.09 - 1.24 mg/dL 8.11  9.14    Sodium 782 - 145 mmol/L 141  142    Potassium 3.5 - 5.1 mmol/L 3.9  4.2    Chloride 98 - 111 mmol/L 107  108    CO2 22 - 32 mmol/L 29  29    Calcium 8.9 - 10.3 mg/dL 8.9  9.1    Total Protein 6.5 - 8.1 g/dL 6.0  6.3    Total Bilirubin 0.0 - 1.2 mg/dL 1.0  0.6    Alkaline Phos 38 - 126 U/L 50  55    AST 15 - 41 U/L 20  15    ALT 0 - 44 U/L 14  10  11     . Lab Results  Component Value Date   LDH 112 05/31/2023   \            RADIOGRAPHIC STUDIES: I have personally reviewed the radiological images as listed and agreed with the findings in the report. No results found.  ASSESSMENT & PLAN:   83 yo with   1) Stage 0 Chronic lymphocytic Leukemia  No overt signs of symptomatic disease progression at this time. CLL FISH with mono and bialleleic 13q deletion. 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  Resolved now  PLAN: -Discussed CT Soft tissue neck results from 10/29/2023 in detail with the patient. Showed Bulky multistation lymphadenopathy in the neck with enlarged lymph nodes demonstrating a homogeneous appearance suggestive of a lymphoproliferative disorder. Index nodes include: Left level IIa node measuring 2.5 x 2.0 x 1.5 cm (series 2, image 60). Left level III/IV node measuring 4.5 x 2.5 x 1.8 cm (image 76). Right level IIb node measuring 2.1 x 1.9 x 1.3 cm (image  46).  -Discussed with the patient that the next plan is: 1. Get a PET/CT scan. And 2. Lab work-up today.  -Discussed with the patient that Squamous cell carcinoma can cause lymph nodes as well.  -Discussed with the patient that recent infection or vaccination could also cause enlarged lymph nodes.  -Answered all of patient's questions.  -Patient agrees with the plan.  -Discussed with the patient that we will consider  needle-biopsy if PET scan shows any enlarged lymph nodes.   FOLLOW-UP: Labs today PET/CT in 1 week Phone visit with Dr Candise Che in 2 weeks  The total time spent in the appointment was 30 minutes* .  All of the patient's questions were answered with apparent satisfaction. The patient knows to call the clinic with any problems, questions or concerns.   Wyvonnia Lora MD MS AAHIVMS [facility] Hematology/Oncology Physician [facility]  .*Total Encounter Time as defined by the Centers for Medicare and Medicaid Services includes, in addition to the face-to-face time of a patient visit (documented in the note above) non-face-to-face time: obtaining and reviewing outside history, ordering and reviewing medications, tests or procedures, care coordination (communications with other health care professionals or caregivers) and documentation in the medical record.   I,Param Shah,acting as a Neurosurgeon for Wyvonnia Lora, MD.,have documented all relevant documentation on the behalf of Wyvonnia Lora, MD,as directed by  Wyvonnia Lora, MD while in the presence of Wyvonnia Lora, MD.  .I have reviewed the above documentation for accuracy and completeness, and I agree with the above. Johney Maine MD

## 2023-11-15 ENCOUNTER — Telehealth: Payer: Self-pay | Admitting: Hematology

## 2023-11-15 NOTE — Telephone Encounter (Signed)
 Spoke with patient confirming upcoming appointment

## 2023-11-22 ENCOUNTER — Ambulatory Visit (HOSPITAL_COMMUNITY)
Admission: RE | Admit: 2023-11-22 | Discharge: 2023-11-22 | Disposition: A | Discharge status: Home / Self Care | Payer: Medicare Other | Source: Ambulatory Visit | Attending: Hematology | Admitting: Hematology

## 2023-11-22 DIAGNOSIS — R590 Localized enlarged lymph nodes: Secondary | ICD-10-CM | POA: Insufficient documentation

## 2023-11-22 DIAGNOSIS — C9110 Chronic lymphocytic leukemia of B-cell type not having achieved remission: Secondary | ICD-10-CM | POA: Diagnosis present

## 2023-11-22 LAB — GLUCOSE, CAPILLARY: Glucose-Capillary: 96 mg/dL (ref 70–99)

## 2023-11-22 MED ORDER — FLUDEOXYGLUCOSE F - 18 (FDG) INJECTION
6.5000 | Freq: Once | INTRAVENOUS | Status: AC | PRN
Start: 2023-11-22 — End: 2023-11-22
  Administered 2023-11-22: 7.46 via INTRAVENOUS

## 2023-11-29 ENCOUNTER — Inpatient Hospital Stay: Payer: Medicare Other | Attending: Hematology | Admitting: Hematology

## 2023-11-29 DIAGNOSIS — E538 Deficiency of other specified B group vitamins: Secondary | ICD-10-CM | POA: Insufficient documentation

## 2023-11-29 DIAGNOSIS — Z79899 Other long term (current) drug therapy: Secondary | ICD-10-CM | POA: Diagnosis not present

## 2023-11-29 DIAGNOSIS — C9110 Chronic lymphocytic leukemia of B-cell type not having achieved remission: Secondary | ICD-10-CM | POA: Diagnosis present

## 2023-11-30 NOTE — Progress Notes (Signed)
 HEMATOLOGY/ONCOLOGY PHONE VISIT NOTE  Date of Service: 11/30/23    Patient Care Team: Barbie Banner, MD as PCP - General (Family Medicine) Tonny Bollman, MD as PCP - Cardiology (Cardiology)  CHIEF COMPLAINTS/PURPOSE OF CONSULTATION:  Follow-up for CLL  HISTORY OF PRESENTING ILLNESS:  Corey [name] is a wonderful 83 y.o. male who has been referred to [place] by Mady Gemma PA-C for evaluation and management of CLL. The pt reports that he is doing well overall.   The pt reports that he has recently had prostatitis and his blood pressure has been abnormally high. Pt has previously been Vitamin B12 deficient, but began taking a Vitamin B12 supplement at the recommendation of Mady Gemma PA-C. He also takes Centrum daily multivitamin.  Pt has been on Omeprazole for 10+ years. Pt notes that he has lost nearly 20 lbs over the course of the last year or so due to eating less because of the pandemic. Pt lives alone and is able to take care of his needs without issue. He lost his mother within the last month. This has contributed greatly to his stress. Pt denies any consitutional symptoms or any new concerns.   Most recent lab results (02/17/2020) of CBC w/diff is as follows: all values are WNL except for WBC at 30.2K, HCT at 52.1, PLT at 124K, Lymphs Abs at 26.0K. 02/17/2020 Vitamin B12 at 315  On review of systems, pt reports stress and denies fatigue, rash, fevers, chills, night sweats, rapid weight loss, new lumps/bumps, abnormal bruising/bleeding and any other symptoms.   On PMHx the pt reports Prostatitis, HTN, HLD, Appendectomy, CLL On Social Hx the pt reports that he is a non-smoker.   INTERVAL HISTORY  Corey [name] is a wonderful 83 y.o. male who is being connected with via telemedicine visit for f/u of CLL.    Patient was last seen by me on 11/12/2023 and complained of enlarged lymph nodes on his left neck, chronic congestion, and unstable appetite since his bypass surgery.     I connected with Franklyn Lor on 11/30/23 at  8:40 AM EST by telephone visit and verified that I am speaking with the correct person using two identifiers.   I discussed the limitations, risks, security and privacy concerns of performing an evaluation and management service by telemedicine and the availability of in-person appointments. I also discussed with the patient that there may be a patient responsible charge related to this service. The patient expressed understanding and agreed to proceed.   Other persons participating in the visit and their role in the encounter: none   Patient's location: home  Provider's location: [facility]   Chief Complaint: CLL    The results of his recent PET scan was discussed with him in detail.  MEDICAL HISTORY:  Past Medical History:  Diagnosis Date   CLL (chronic lymphocytic leukemia) (HCC)    Coronary artery disease    Diverticulitis    Gout    Hypercholesteremia    Hypertension    Prostate disorder    Acute renal injury (HCC) 06/20/2015   CLL (chronic lymphocytic leukemia) (HCC)   SURGICAL HISTORY: Past Surgical History:  Procedure Laterality Date   APPENDECTOMY     CARDIAC CATHETERIZATION     CORONARY ARTERY BYPASS GRAFT N/A 08/01/2022   Procedure: 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 USING LEFT INTERNAL MAMMARY AND RIGHT SAPHENOUS LEG VEIN HARVESTED ENDOSCOPICALLY;  Surgeon: Loreli Slot, MD;  Location: [facility] OR;  Service: Open Heart  Surgery;  Laterality: N/A;   LEFT HEART CATH AND CORONARY ANGIOGRAPHY N/A 07/31/2022   Procedure: LEFT HEART CATH AND CORONARY ANGIOGRAPHY;  Surgeon: Tonny Bollman, MD;  Location: [facility] INVASIVE CV LAB;  Service: Cardiovascular;  Laterality: N/A;   TEE WITHOUT CARDIOVERSION N/A 08/01/2022   Procedure: TRANSESOPHAGEAL ECHOCARDIOGRAM (TEE);  Surgeon: Loreli Slot, MD;  Location: [facility] OR;  Service: Open Heart Surgery;  Laterality: N/A;    SOCIAL HISTORY: Social History    Socioeconomic History   Marital status: Divorced    Spouse name: Not on file   Number of children: Not on file   Years of education: 12   Highest education level: Some college, no degree  Occupational History   Occupation: Retired  Tobacco Use   Smoking status: Never   Smokeless tobacco: Never  Vaping Use   Vaping status: Never Used  Substance and Sexual Activity   Alcohol use: Yes    Comment: occasional   Drug use: No   Sexual activity: Not Currently  Other Topics Concern   Not on file  Social History Narrative   Not on file   Social Drivers of Health   Financial Resource Strain: Low Risk  (02/21/2021)   Received from [facility] visits prior to 11/24/2022., [facility] visits prior to 11/24/2022.   Overall Financial Resource Strain (CARDIA)    Difficulty of Paying Living Expenses: Not very hard  Food Insecurity: Low Risk  (08/28/2023)   Received from [facility]   Hunger Vital Sign    Worried About Running Out of Food in the Last Year: Never true    Ran Out of Food in the Last Year: Never true  Transportation Needs: No Transportation Needs (08/28/2023)   Received from [employer]    In the past 12 months, has lack of reliable transportation kept you from medical appointments, meetings, work or from getting things needed for daily living? : No  Physical Activity: Not on file  Stress: Not on file  Social Connections: Not on file  Intimate Partner Violence: Not on file    FAMILY HISTORY: Family History  Problem Relation Age of Onset   Hypertension Father    Heart attack Father     ALLERGIES:  has no known allergies.  MEDICATIONS:  Current Outpatient Medications  Medication Sig Dispense Refill   allopurinol (ZYLOPRIM) 300 MG tablet Take 300 mg by mouth daily.     aspirin EC 81 MG tablet Take 1 tablet (81 mg total) by mouth daily.     Cyanocobalamin (VITAMIN B 12) 500 MCG TABS Take 1 tablet by mouth  daily.     famotidine (PEPCID) 20 MG tablet Take 1 tablet (20 mg total) by mouth daily. 90 tablet 3   finasteride (PROSCAR) 5 MG tablet Take 5 mg by mouth daily.     melatonin 3 MG TABS tablet Take 3 mg by mouth at bedtime as needed.     omeprazole (PRILOSEC) 40 MG capsule Take 1 capsule (40 mg total) by mouth as needed.     rosuvastatin (CRESTOR) 40 MG tablet TAKE 1 TABLET BY MOUTH EVERY DAY 90 tablet 3   Tamsulosin HCl (FLOMAX) 0.4 MG CAPS Take 0.4 mg by mouth in the morning and at bedtime.     No current facility-administered medications for this visit.    REVIEW OF SYSTEMS:    10 Point review of Systems was done is negative except as noted above.  PHYSICAL EXAMINATION: TELEMEDICINE VISIT ECOG PERFORMANCE STATUS: 0 - Asymptomatic  . There were no vitals filed for this visit.  There were no vitals filed for this visit.  .There is no height or weight on file to calculate BMI.    LABORATORY DATA:  I have reviewed the data as listed  .    Latest Ref Rng & Units 11/12/2023    3:42 PM 05/31/2023   12:17 PM 08/04/2022   12:53 AM  CBC  WBC 4.0 - 10.5 K/uL 30.1  21.0  27.4   Hemoglobin 13.0 - 17.0 g/dL 16.1  09.6  04.5   Hematocrit 39.0 - 52.0 % 44.1  40.3  31.9   Platelets 150 - 400 K/uL 117  152  103     .    Latest Ref Rng & Units 11/12/2023    3:42 PM 05/31/2023   12:17 PM 12/07/2022    1:17 PM  CMP  Glucose 70 - 99 mg/dL 97  94    BUN 8 - 23 mg/dL 11  12    Creatinine 4.09 - 1.24 mg/dL 8.11  9.14    Sodium 782 - 145 mmol/L 141  142    Potassium 3.5 - 5.1 mmol/L 3.9  4.2    Chloride 98 - 111 mmol/L 107  108    CO2 22 - 32 mmol/L 29  29    Calcium 8.9 - 10.3 mg/dL 8.9  9.1    Total Protein 6.5 - 8.1 g/dL 6.0  6.3    Total Bilirubin 0.0 - 1.2 mg/dL 1.0  0.6    Alkaline Phos 38 - 126 U/L 50  55    AST 15 - 41 U/L 20  15    ALT 0 - 44 U/L 14  10  11     . Lab Results  Component Value Date   LDH 125 11/12/2023   \            RADIOGRAPHIC STUDIES: I  have personally reviewed the radiological images as listed and agreed with the findings in the report. NM PET Image Initial (PI) Skull Base To Thigh Result Date: 11/29/2023 CLINICAL DATA:  Initial treatment strategy for progressive CLL. EXAM: NUCLEAR MEDICINE PET SKULL BASE TO THIGH TECHNIQUE: 7.5 mCi F-18 FDG was injected intravenously. Full-ring PET imaging was performed from the skull base to thigh after the radiotracer. CT data was obtained and used for attenuation correction and anatomic localization. Fasting blood glucose: 96 mg/dl COMPARISON:  CT abdomen pelvis 05/23/2020. FINDINGS: Mediastinal blood pool activity: SUV max 2.5 Liver activity: SUV max 3.2 NECK: Hypometabolic cervical lymph nodes with index right level II 12 mm node (4/31), SUV max 2.3. Incidental CT findings: None. CHEST: Small supraclavicular, mediastinal and axillary lymph nodes do not show abnormal hypermetabolism. Incidental CT findings: Atherosclerotic calcification of the aorta and aortic valve. Heart is at the upper limits of normal in size to mildly enlarged. No pericardial or pleural effusion. ABDOMEN/PELVIS: No abnormal hypermetabolism. Incidental CT findings: Gallstones. Low-attenuation lesions in the kidneys. No specific follow-up necessary. SKELETON: No abnormal hypermetabolism. Incidental CT findings: Degenerative changes in the spine. IMPRESSION: 1. Enlarged cervical lymph nodes show metabolism just below blood pool. Otherwise, no evidence of metabolic active CLL. 2. Cholelithiasis. 3.  Aortic atherosclerosis (ICD10-I70.0). Electronically Signed   By: Leanna Battles M.D.   On: 11/29/2023 10:14    ASSESSMENT & PLAN:   83 yo with   1) Stage 0 Chronic lymphocytic Leukemia  No overt signs of symptomatic  disease progression at this time. CLL FISH with mono and bialleleic 13q deletion. 2) B12 deficiency  -continue SL B12 -anti IF and anti Parietal cell antibiotics neg  3) Secondary Polycythemia - Jak2 mutation done  previously was negative.  Resolved now  PLAN:  -PET scan from 11/22/2023 showed: 1. Enlarged cervical lymph nodes show metabolism just below blood pool. Otherwise, no evidence of metabolic active CLL. 2. Cholelithiasis. 3.  Aortic atherosclerosis (ICD10-I70.0). -discussed findings of very small lymph nodes in the neck 12 mm in size, which do not look very active. There is no other bulky disease. There are small lymph nodes that did not light up in the axillary/collarbone and are not pathologically enlarged. There is no sign of disease under the diaphragm.  -Overall, PET scan showed very minimal disease only in the neck, which is reassuring -no indication for treatment at this time -answered all of patient's questions in detail, including regarding gallstones and atherosclerosis  -recommend risk reduction strategies with his PCP. Discussed that there may be a role for further evaluation if there are any associated symptoms with  Cholelithiasis/aortic atherosclerosis.   FOLLOW-UP: ***  The total time spent in the appointment was *** minutes* .  All of the patient's questions were answered with apparent satisfaction. The patient knows to call the clinic with any problems, questions or concerns.   Wyvonnia Lora MD MS AAHIVMS [facility] Hematology/Oncology Physician [facility]  .*Total Encounter Time as defined by the Centers for Medicare and Medicaid Services includes, in addition to the face-to-face time of a patient visit (documented in the note above) non-face-to-face time: obtaining and reviewing outside history, ordering and reviewing medications, tests or procedures, care coordination (communications with other health care professionals or caregivers) and documentation in the medical record.    I,Mitra Faeizi,acting as a Neurosurgeon for Wyvonnia Lora, MD.,have documented all relevant documentation on the behalf of Wyvonnia Lora, MD,as directed by  Wyvonnia Lora, MD while in the presence  of Wyvonnia Lora, MD.  ***

## 2023-12-02 ENCOUNTER — Telehealth: Payer: Self-pay

## 2023-12-02 NOTE — Telephone Encounter (Signed)
 error

## 2023-12-18 NOTE — Progress Notes (Signed)
 Cardiology Office Note:  .   Date:  12/23/2023  ID:  Corey [name], DOB 05-May-1941, MRN [id number] PCP: Barbie Banner, MD  [facility] Providers Cardiologist:  Tonny Bollman, MD    Patient Profile: .      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  He underwent emergent two-vessel CABG with LIMA to LAD and saphenous vein graft to ramus intermedius.  He completed cardiac rehab post surgery.  Beta-blocker was ultimately discontinued because of marked sinus bradycardia.  EF 40 to 45% at that time.  Last cardiology clinic visit was 06/26/2023 with Dr. Excell Seltzer.  He was having more reflux due to inability to take omeprazole.  He was advised to discontinue clopidogrel and remain on aspirin for antiplatelet therapy.  Advised he could resume omeprazole for GERD symptoms.  Echo was repeated to evaluate LV function.  TTE 07/11/2023 with normal LVEF 60 to 65%, mild LVH, G1 DD, mild MR, mild aortic valve calcification with no stenosis.       History of Present Illness: .   Corey [name] is a very pleasant 83 y.o. male who is here for follow-up of CAD.  He reports he remains active with house and yard work and walking his dog.  He occasionally gets lightheaded with getting up and down and has occasional shortness of breath but feels this is secondary to more exertion and improves with a short rest.  No presyncope, syncope, orthopnea, PND, edema,chest pain, or palpitations..  Maintaining good hydration. Has not monitored BP consistently recently, but notes SBP  typically 130-140 mmHg. he reports occasional acid reflux but does not take omeprazole daily.  Discussed the use of AI scribe software for clinical note transcription with the patient, who gave verbal consent to proceed.   ROS: See HPI       Studies Reviewed: Marland Kitchen   EKG Interpretation Date/Time:  Monday December 23 2023 15:34:08 EDT Ventricular Rate:  65 PR Interval:  176 QRS Duration:  92 QT Interval:  414 QTC Calculation: 430 R Axis:   -43  Text Interpretation: Sinus rhythm with Premature atrial complexes Left axis deviation Cannot rule out Anterior infarct , age undetermined When compared with ECG of 02-Aug-2022 06:37, No significant change was found Confirmed by Eligha Bridegroom [phone]) on 12/23/2023 3:51:34 PM     Lipoprotein (a)  Date/Time Value Ref Range Status  08/01/2022 06:03 AM 10.8 <75.0 nmol/L Final    Comment:    (NOTE) Note:  Values greater than or equal to 75.0 nmol/L may       indicate an independent risk factor for CHD,       but must be evaluated with caution when applied       to non-Caucasian populations due to the       influence of genetic factors on Lp(a) across       ethnicities. Performed At: [facility] [address] [place], [place] [id number] Jolene Schimke MD AO:[phone]      Risk Assessment/Calculations:     HYPERTENSION CONTROL Vitals:   12/23/23 1536 12/23/23 1604  BP: (!) 166/78 (!) 140/80  [name] blood pressure is elevated above target today.  In order to address the patient's elevated BP: Blood pressure will be monitored at home to determine if medication changes need to be made.          Physical Exam:   VS:  BP (!) 140/80   Pulse 65   Ht 5\' 9"  (1.753 m)   Wt 157 lb 6.4 oz (71.4 kg)   SpO2 98%   BMI 23.24 kg/m    Wt Readings from Last 3 Encounters:  12/23/23 157 lb 6.4 oz (71.4 kg)  11/12/23 159 lb 6.4 oz (72.3 kg)  06/26/23 157 lb 12.8 oz (71.6 kg)    GEN: Well nourished, well developed in no acute  distress NECK: No JVD; No carotid bruits CARDIAC: RRR, no murmurs, rubs, gallops RESPIRATORY:  Clear to auscultation without rales, wheezing or rhonchi  ABDOMEN: Soft, non-tender, non-distended EXTREMITIES:  No edema; No deformity     ASSESSMENT AND PLAN: .    CAD s/p CABG: History of STEMI 07/2022 with thrombotic ulcerated ostial/prox Cx (culprit lesion) and subsequent CABG x 2. He remains active and denies chest pain, dyspnea, or other symptoms concerning for angina. No indication for further ischemic evaluation at this time. Need updated lipid panel to review. No bleeding concerns.  Continue aspirin, rosuvastatin.  Elevated blood pressure: BP elevated and remains elevated but improved on my recheck.  He monitors occasionally at home and reports SBP typically 130 to 140 mmHg.  Chart review reveals elevated BP at recent oncology visit.  I have asked him to monitor at home and report in 2 weeks.  Consider addition of anti-hypertensive agent if BP consistently > 140/80.   Hyperlipidemia LDL goal < 55: Last lipid panel completed 11/2022 with LDL of 6.  We contacted PCP and he does not have a more recent lipid panel to review.  Have asked him to get fasting labs in the next few days. Continue rosuvastatin.   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  Repeat echo 07/11/2023 with normal LVEF 60 to 65%, G1 DD, mild LVH, no significant valve disease. Appears euvolemic on exam.  Occasional mild shortness of breath but no significant dyspnea, no orthopnea, PND, or edema.        Disposition:6 months with Dr. Excell Seltzer   Signed, Eligha Bridegroom, NP-C

## 2023-12-23 ENCOUNTER — Other Ambulatory Visit: Payer: Self-pay | Admitting: *Deleted

## 2023-12-23 ENCOUNTER — Encounter: Payer: Self-pay | Admitting: Nurse Practitioner

## 2023-12-23 ENCOUNTER — Telehealth: Payer: Self-pay

## 2023-12-23 ENCOUNTER — Ambulatory Visit: Payer: Medicare Other | Attending: Nurse Practitioner | Admitting: Nurse Practitioner

## 2023-12-23 VITALS — BP 140/80 | HR 65 | Ht 69.0 in | Wt 157.4 lb

## 2023-12-23 DIAGNOSIS — I10 Essential (primary) hypertension: Secondary | ICD-10-CM

## 2023-12-23 DIAGNOSIS — Z951 Presence of aortocoronary bypass graft: Secondary | ICD-10-CM | POA: Diagnosis not present

## 2023-12-23 DIAGNOSIS — R030 Elevated blood-pressure reading, without diagnosis of hypertension: Secondary | ICD-10-CM

## 2023-12-23 DIAGNOSIS — E785 Hyperlipidemia, unspecified: Secondary | ICD-10-CM | POA: Diagnosis not present

## 2023-12-23 DIAGNOSIS — I255 Ischemic cardiomyopathy: Secondary | ICD-10-CM

## 2023-12-23 DIAGNOSIS — I2510 Atherosclerotic heart disease of native coronary artery without angina pectoris: Secondary | ICD-10-CM

## 2023-12-23 NOTE — Telephone Encounter (Signed)
 Called pt PCP [facility] Benedetto Goad, MD office) to check and see if pt had a Lipid test recent. PCP office stated that the last Lipid test pt had was June 2023.

## 2023-12-23 NOTE — Patient Instructions (Addendum)
 Medication Instructions:   Your physician recommends that you continue on your current medications as directed. Please refer to the Current Medication list given to you today.   *If you need a refill on your cardiac medications before your next appointment, please call your pharmacy*  Lab Work:  If we cannot find your fasting lab work you will have to repeat please.  Will be in touch.   If you have labs (blood work) drawn today and your tests are completely normal, you will receive your results only by: MyChart Message (if you have MyChart) OR A paper copy in the mail If you have any lab test that is abnormal or we need to change your treatment, we will call you to review the results.  Testing/Procedures:  None ordered.  Follow-Up: At [facility], you and your health needs are our priority.  As part of our continuing mission to provide you with exceptional heart care, our providers are all part of one team.  This team includes your primary Cardiologist (physician) and Advanced Practice Providers or APPs (Physician Assistants and Nurse Practitioners) who all work together to provide you with the care you need, when you need it.  Your next appointment:   6 month(s)  Provider:   Tonny Bollman, MD     We recommend signing up for the patient portal called "MyChart".  Sign up information is provided on this After Visit Summary.  MyChart is used to connect with patients for Virtual Visits (Telemedicine).  Patients are able to view lab/test results, encounter notes, upcoming appointments, etc.  Non-urgent messages can be sent to your provider as well.   To learn more about what you can do with MyChart, go to [email or web address].   Other Instructions  HOW TO TAKE YOUR BLOOD PRESSURE  Rest 5 minutes before taking your blood pressure. Don't  smoke or drink caffeinated beverages for at least 30 minutes before. Take your blood pressure before (not after) you eat. Sit  comfortably with your back supported and both feet on the floor ( don't cross your legs). Elevate your arm to heart level on a table or a desk. Use the proper sized cuff.  It should fit smoothly and snugly around your bare upper arm.  There should be  Enough room to slip a fingertip under the cuff.  The bottom edge of the cuff should be 1 inch above the crease Of the elbow. Please monitor your blood pressure once daily 2 hours after your am medication. If you blood pressure Consistently remains above 140 (systolic) top number or over 80 ( diastolic) bottom number X 3 days  Consecutively.  Please call our office at [phone] or send Mychart message.     ----Avoid cold medicines with D or DM at the end of them----  Please send through mychart in two weeks your BP and HR readings.          1st Floor: - Lobby - Registration  - Pharmacy  - Lab - Cafe  2nd Floor: - PV Lab - Diagnostic Testing (echo, CT, nuclear med)  3rd Floor: - Vacant  4th Floor: - TCTS (cardiothoracic surgery) - AFib Clinic - Structural Heart Clinic - Vascular Surgery  - Vascular Ultrasound  5th Floor: - HeartCare Cardiology (general and EP) - Clinical Pharmacy for coumadin, hypertension, lipid, weight-loss medications, and med management appointments    Valet parking services will be available as well.

## 2023-12-25 LAB — LIPID PANEL
Chol/HDL Ratio: 2.9 ratio (ref 0.0–5.0)
Cholesterol, Total: 114 mg/dL (ref 100–199)
HDL: 40 mg/dL (ref >39)
LDL Chol Calc (NIH): 53 mg/dL (ref 0–99)
Triglycerides: 112 mg/dL (ref 0–149)
VLDL Cholesterol Cal: 21 mg/dL (ref 5–40)

## 2023-12-30 ENCOUNTER — Encounter (HOSPITAL_BASED_OUTPATIENT_CLINIC_OR_DEPARTMENT_OTHER): Payer: Self-pay

## 2024-01-02 NOTE — Telephone Encounter (Signed)
Follow up questions?  

## 2024-01-08 ENCOUNTER — Encounter (HOSPITAL_BASED_OUTPATIENT_CLINIC_OR_DEPARTMENT_OTHER): Payer: Self-pay

## 2024-05-18 ENCOUNTER — Other Ambulatory Visit: Payer: Self-pay

## 2024-05-18 DIAGNOSIS — C9110 Chronic lymphocytic leukemia of B-cell type not having achieved remission: Secondary | ICD-10-CM

## 2024-05-19 ENCOUNTER — Inpatient Hospital Stay: Attending: Hematology

## 2024-05-19 ENCOUNTER — Inpatient Hospital Stay: Admitting: Hematology

## 2024-05-19 VITALS — BP 146/73 | HR 65 | Temp 98.1°F | Resp 16 | Wt 160.0 lb

## 2024-05-19 DIAGNOSIS — C9110 Chronic lymphocytic leukemia of B-cell type not having achieved remission: Secondary | ICD-10-CM | POA: Diagnosis not present

## 2024-05-19 DIAGNOSIS — Z79899 Other long term (current) drug therapy: Secondary | ICD-10-CM | POA: Diagnosis not present

## 2024-05-19 DIAGNOSIS — E538 Deficiency of other specified B group vitamins: Secondary | ICD-10-CM | POA: Diagnosis not present

## 2024-05-19 LAB — CBC WITH DIFFERENTIAL (CANCER CENTER ONLY)
Abs Immature Granulocytes: 0.05 K/uL (ref 0.00–0.07)
Basophils Absolute: 0.1 K/uL (ref 0.0–0.1)
Basophils Relative: 0 %
Eosinophils Absolute: 0.1 K/uL (ref 0.0–0.5)
Eosinophils Relative: 0 %
HCT: 42.1 % (ref 39.0–52.0)
Hemoglobin: 14 g/dL (ref 13.0–17.0)
Immature Granulocytes: 0 %
Lymphocytes Relative: 81 %
Lymphs Abs: 24.9 K/uL — ABNORMAL HIGH (ref 0.7–4.0)
MCH: 31.7 pg (ref 26.0–34.0)
MCHC: 33.3 g/dL (ref 30.0–36.0)
MCV: 95.5 fL (ref 80.0–100.0)
Monocytes Absolute: 0.5 K/uL (ref 0.1–1.0)
Monocytes Relative: 2 %
Neutro Abs: 5.1 K/uL (ref 1.7–7.7)
Neutrophils Relative %: 17 %
Platelet Count: 108 K/uL — ABNORMAL LOW (ref 150–400)
RBC: 4.41 MIL/uL (ref 4.22–5.81)
RDW: 13.3 % (ref 11.5–15.5)
Smear Review: NORMAL
WBC Count: 30.8 K/uL — ABNORMAL HIGH (ref 4.0–10.5)
nRBC: 0 % (ref 0.0–0.2)

## 2024-05-19 LAB — CMP (CANCER CENTER ONLY)
ALT: 14 U/L (ref 0–44)
AST: 18 U/L (ref 15–41)
Albumin: 4.3 g/dL (ref 3.5–5.0)
Alkaline Phosphatase: 50 U/L (ref 38–126)
Anion gap: 5 (ref 5–15)
BUN: 9 mg/dL (ref 8–23)
CO2: 28 mmol/L (ref 22–32)
Calcium: 9 mg/dL (ref 8.9–10.3)
Chloride: 109 mmol/L (ref 98–111)
Creatinine: 1.19 mg/dL (ref 0.61–1.24)
GFR, Estimated: >60 mL/min (ref >60)
Glucose, Bld: 99 mg/dL (ref 70–99)
Potassium: 4.4 mmol/L (ref 3.5–5.1)
Sodium: 142 mmol/L (ref 135–145)
Total Bilirubin: 1.2 mg/dL (ref 0.0–1.2)
Total Protein: 6.2 g/dL — ABNORMAL LOW (ref 6.5–8.1)

## 2024-05-19 LAB — RETICULOCYTES
Immature Retic Fract: 11.3 % (ref 2.3–15.9)
RBC.: 4.39 MIL/uL (ref 4.22–5.81)
Retic Count, Absolute: 68 K/uL (ref 19.0–186.0)
Retic Ct Pct: 1.6 % (ref 0.4–3.1)

## 2024-05-19 LAB — LACTATE DEHYDROGENASE: LDH: 127 U/L (ref 98–192)

## 2024-05-25 NOTE — Progress Notes (Signed)
 HEMATOLOGY/ONCOLOGY CLINIC VISIT NOTE  Date of Service: .05/19/2024  Patient Care Team: Debrah Josette ORN., PA-C as PCP - General (Family Medicine) Wonda Sharper, MD as PCP - Cardiology (Cardiology)  CHIEF COMPLAINTS/PURPOSE OF CONSULTATION:  Follow-up for continued evaluation management of CLL  HISTORY OF PRESENTING ILLNESS:  Corey [name] is a wonderful 83 y.o. male who has been referred to us  by Josette Debrah PA-C for evaluation and management of CLL. The pt reports that he is doing well overall.   The pt reports that he has recently had prostatitis and his blood pressure has been abnormally high. Pt has previously been Vitamin B12 deficient, but began taking a Vitamin B12 supplement at the recommendation of Josette Debrah PA-C. He also takes Centrum daily multivitamin.  Pt has been on Omeprazole  for 10+ years. Pt notes that he has lost nearly 20 lbs over the course of the last year or so due to eating less because of the pandemic. Pt lives alone and is able to take care of his needs without issue. He lost his mother within the last month. This has contributed greatly to his stress. Pt denies any consitutional symptoms or any new concerns.   Most recent lab results (02/17/2020) of CBC w/diff is as follows: all values are WNL except for WBC at 30.2K, HCT at 52.1, PLT at 124K, Lymphs Abs at 26.0K. 02/17/2020 Vitamin B12 at 315  On review of systems, pt reports stress and denies fatigue, rash, fevers, chills, night sweats, rapid weight loss, new lumps/bumps, abnormal bruising/bleeding and any other symptoms.   On PMHx the pt reports Prostatitis, HTN, HLD, Appendectomy, CLL On Social Hx the pt reports that he is a non-smoker.   INTERVAL HISTORY  Corey [name] is a wonderful 83 y.o. male who is here for continued evaluation and management of CLL. He notes fluctuating cervical lymphadenopathy without any significant increase. No other acute new symptoms. No fevers no chills no  night sweats no unexpected weight loss. No new shortness of breath or chest pain. No completely new lymphadenopathy. No abdominal pain or distention. Labs done today were discussed with her in details.  MEDICAL HISTORY:  Past Medical History:  Diagnosis Date   CLL (chronic lymphocytic leukemia) (HCC)    Coronary artery disease    Diverticulitis    Gout    Hypercholesteremia    Hypertension    Prostate disorder    Acute renal injury (HCC) 06/20/2015   CLL (chronic lymphocytic leukemia) (HCC)   SURGICAL HISTORY: Past Surgical History:  Procedure Laterality Date   APPENDECTOMY     CARDIAC CATHETERIZATION     CORONARY ARTERY BYPASS GRAFT N/A 08/01/2022   Procedure: 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 USING LEFT INTERNAL MAMMARY AND RIGHT SAPHENOUS LEG VEIN HARVESTED ENDOSCOPICALLY;  Surgeon: Kerrin Elspeth BROCKS, MD;  Location: [facility] OR;  Service: Open Heart Surgery;  Laterality: N/A;   LEFT HEART CATH AND CORONARY ANGIOGRAPHY N/A 07/31/2022   Procedure: LEFT HEART CATH AND CORONARY ANGIOGRAPHY;  Surgeon: Wonda Sharper, MD;  Location: [facility] INVASIVE CV LAB;  Service: Cardiovascular;  Laterality: N/A;   TEE WITHOUT CARDIOVERSION N/A 08/01/2022   Procedure: TRANSESOPHAGEAL ECHOCARDIOGRAM (TEE);  Surgeon: Kerrin Elspeth BROCKS, MD;  Location: [facility] OR;  Service: Open Heart Surgery;  Laterality: N/A;    SOCIAL HISTORY: Social History   Socioeconomic History   Marital status: Divorced    Spouse name: Not on file   Number of children: Not on file   Years of education:  12   Highest education level: Some college, no degree  Occupational History   Occupation: Retired  Tobacco Use   Smoking status: Never   Smokeless tobacco: Never  Vaping Use   Vaping status: Never Used  Substance and Sexual Activity   Alcohol use: Yes    Comment: occasional   Drug use: No   Sexual activity: Not Currently  Other Topics Concern   Not on file  Social History Narrative   Not on file    Social Drivers of Health   Financial Resource Strain: Low Risk  (02/21/2021)   Received from [facility] visits prior to 11/24/2022.   Overall Financial Resource Strain (CARDIA)    Difficulty of Paying Living Expenses: Not very hard  Food Insecurity: Low Risk  (08/28/2023)   Received from [facility]   Hunger Vital Sign    Within the past 12 months, you worried that your food would run out before you got money to buy more: Never true    Within the past 12 months, the food you bought just didn't last and you didn't have money to get more. : Never true  Transportation Needs: No Transportation Needs (08/28/2023)   Received from [facility]    In the past 12 months, has lack of reliable transportation kept you from medical appointments, meetings, work or from getting things needed for daily living? : No  Physical Activity: Not on file  Stress: Not on file  Social Connections: Not on file  Intimate Partner Violence: Not on file    FAMILY HISTORY: Family History  Problem Relation Age of Onset   Hypertension Father    Heart attack Father     ALLERGIES:  has no known allergies.  MEDICATIONS:  Current Outpatient Medications  Medication Sig Dispense Refill   allopurinol  (ZYLOPRIM ) 300 MG tablet Take 300 mg by mouth daily.     aspirin  EC 81 MG tablet Take 1 tablet (81 mg total) by mouth daily.     Cyanocobalamin (VITAMIN B 12) 500 MCG TABS Take 1 tablet by mouth daily.     finasteride  (PROSCAR ) 5 MG tablet Take 5 mg by mouth daily.     omeprazole  (PRILOSEC) 40 MG capsule Take 1 capsule (40 mg total) by mouth as needed.     rosuvastatin  (CRESTOR ) 40 MG tablet TAKE 1 TABLET BY MOUTH EVERY DAY 90 tablet 3   Tamsulosin  HCl (FLOMAX ) 0.4 MG CAPS Take 0.4 mg by mouth in the morning and at bedtime.     melatonin 3 MG TABS tablet Take 3 mg by mouth at bedtime as needed.     No current facility-administered medications for this visit.    REVIEW OF  SYSTEMS:    .10 Point review of Systems was done is negative except as noted above.  PHYSICAL EXAMINATION: TELEMEDICINE VISIT ECOG PERFORMANCE STATUS: 0 - Asymptomatic  . Vitals:   05/19/24 1055  BP: (!) 146/73  Pulse: 65  Resp: 16  Temp: 98.1 F (36.7 C)  SpO2: 99%    Filed Weights   05/19/24 1055  Weight: 160 lb (72.6 kg)    .Body mass index is 23.63 kg/m.  SABRA GENERAL:alert, in no acute distress and comfortable SKIN: no acute rashes, no significant lesions EYES: conjunctiva are pink and non-injected, sclera anicteric OROPHARYNX: MMM, no exudates, no oropharyngeal erythema or ulceration NECK: supple, no JVD LYMPH: Bilateral cervical nonbulky lymphadenopathy, stable.  No palpable axillary or inguinal lymphadenopathy. LUNGS: clear to auscultation  b/l with normal respiratory effort HEART: regular rate & rhythm ABDOMEN:  normoactive bowel sounds , non tender, not distended.  No palpable hepatosplenomegaly. Extremity: no pedal edema PSYCH: alert & oriented x 3 with fluent speech NEURO: no focal motor/sensory deficits    LABORATORY DATA:  I have reviewed the data as listed  .    Latest Ref Rng & Units 05/19/2024   10:04 AM 11/12/2023    3:42 PM 05/31/2023   12:17 PM  CBC  WBC 4.0 - 10.5 K/uL 30.8  30.1  21.0   Hemoglobin 13.0 - 17.0 g/dL 85.9  85.4  86.4   Hematocrit 39.0 - 52.0 % 42.1  44.1  40.3   Platelets 150 - 400 K/uL 108  117  152     .    Latest Ref Rng & Units 05/19/2024   10:04 AM 11/12/2023    3:42 PM 05/31/2023   12:17 PM  CMP  Glucose 70 - 99 mg/dL 99  97  94   BUN 8 - 23 mg/dL 9  11  12    Creatinine 0.61 - 1.24 mg/dL 8.80  8.96  8.95   Sodium 135 - 145 mmol/L 142  141  142   Potassium 3.5 - 5.1 mmol/L 4.4  3.9  4.2   Chloride 98 - 111 mmol/L 109  107  108   CO2 22 - 32 mmol/L 28  29  29    Calcium  8.9 - 10.3 mg/dL 9.0  8.9  9.1   Total Protein 6.5 - 8.1 g/dL 6.2  6.0  6.3   Total Bilirubin 0.0 - 1.2 mg/dL 1.2  1.0  0.6   Alkaline Phos 38 - 126  U/L 50  50  55   AST 15 - 41 U/L 18  20  15    ALT 0 - 44 U/L 14  14  10     . Lab Results  Component Value Date   LDH 127 05/19/2024               RADIOGRAPHIC STUDIES: I have personally reviewed the radiological images as listed and agreed with the findings in the report. No results found.   ASSESSMENT & PLAN:   83 yo with   1) Stage 1 Chronic lymphocytic Leukemia  No overt signs of symptomatic disease progression at this time. CLL FISH with mono and bialleleic 13q deletion. 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  Resolved now  PLAN:  -Labs done today 05/19/2024 were discussed in detail with the patient CBC shows persistent leukocytosis at 30.8k with lymphocytosis.  Hemoglobin stable at her current.  Platelets slightly lower at 108k LDH within normal limits at 127k Patient does not have any lab or clinical findings suggestive of significant CLL progression at this time.. Patient has no bulky lymphadenopathy or significant hepatosplenomegaly or constitutional symptoms. No clear indication to initiate treatment for the patient's CLL at this time. He was recommended to continue follow-up with his PCP for age-appropriate cancer screening and vaccinations Continue follow-up with dermatology for annual skin screening for skin cancer. We will see him back in 6 months to monitor counts and clinical status.  FOLLOW-UP: RTC with Dr Onesimo with labs in 6 months  The total time spent in the appointment was 20 minutes*.  All of the patient's questions were answered with apparent satisfaction. The patient knows to call the clinic with any  problems, questions or concerns.   Emaline Saran MD MS AAHIVMS [facility] Hematology/Oncology Physician [facility]  .*Total Encounter Time as defined by the Centers for Medicare and Medicaid Services includes,  in addition to the face-to-face time of a patient visit (documented in the note above) non-face-to-face time: obtaining and reviewing outside history, ordering and reviewing medications, tests or procedures, care coordination (communications with other health care professionals or caregivers) and documentation in the medical record.

## 2024-06-01 ENCOUNTER — Ambulatory Visit: Payer: Medicare Other | Admitting: Hematology

## 2024-06-01 ENCOUNTER — Other Ambulatory Visit: Payer: Medicare Other

## 2024-06-10 ENCOUNTER — Ambulatory Visit: Attending: Cardiology | Admitting: Cardiovascular Disease

## 2024-06-10 ENCOUNTER — Encounter: Payer: Self-pay | Admitting: Cardiovascular Disease

## 2024-06-10 VITALS — BP 128/70 | HR 63 | Ht 69.0 in | Wt 162.2 lb

## 2024-06-10 DIAGNOSIS — I10 Essential (primary) hypertension: Secondary | ICD-10-CM

## 2024-06-10 DIAGNOSIS — I2510 Atherosclerotic heart disease of native coronary artery without angina pectoris: Secondary | ICD-10-CM

## 2024-06-10 DIAGNOSIS — E785 Hyperlipidemia, unspecified: Secondary | ICD-10-CM | POA: Diagnosis not present

## 2024-06-10 NOTE — Progress Notes (Signed)
 Cardiology Office Note:    Date:  06/10/2024   ID:  [name], DOB May 26, 1941, MRN [id number]  PCP:  Debrah Josette MOHR PA-C   [facility] Providers Cardiologist:  Ozell Fell, MD     Referring MD: Tanda Prentice DEL, MD   Chief Complaint  Patient presents with   Coronary Artery Disease    History of Present Illness:    Corey [name] is a 83 y.o. male with a hx of CAD, hypertension, and hyperlipidemia, presenting for follow-up evaluation. The patient presented with a STEMI in November 2023, found to have severe left main and proximal multivessel coronary artery disease. He underwent emergent two-vessel CABG with a LIMA to LAD and saphenous vein graft to ramus intermedius. The patient completed cardiac rehabilitation after surgery. His beta-blocker was ultimately discontinued because of marked sinus bradycardia.  [name] initial LVEF was 45% but normalized to an LVEF of 60 to 65% after revascularization by follow-up echo assessment.  The patient is here alone today and is doing well at present.  He denies chest pain, chest pressure, or edema.  He does have some fatigue that occurs when he is active.  States that he has to do all of his housework and yard work.  He denies orthopnea or PND.  He does have some mild shortness of breath with activity.   Current Medications: Current Meds  Medication Sig   allopurinol  (ZYLOPRIM ) 300 MG tablet Take 300 mg by mouth daily.   aspirin  EC 81 MG tablet Take 1 tablet (81 mg total) by mouth daily.   Cyanocobalamin (VITAMIN B 12) 500 MCG TABS Take 1 tablet by mouth daily.   finasteride  (PROSCAR ) 5 MG tablet Take 5 mg by mouth daily.   melatonin 3 MG TABS tablet Take 3 mg by mouth at bedtime as needed.   omeprazole  (PRILOSEC) 40 MG capsule Take 1 capsule (40 mg total) by mouth as needed.   rosuvastatin  (CRESTOR ) 40 MG tablet TAKE 1 TABLET BY MOUTH EVERY DAY   Tamsulosin  HCl (FLOMAX ) 0.4 MG CAPS Take 0.4 mg by mouth in the morning  and at bedtime.     Allergies:   Patient has no known allergies.   ROS:   Please see the history of present illness.    All other systems reviewed and are negative.  EKGs/Labs/Other Studies Reviewed:    The following studies were reviewed today: Cardiac Studies & Procedures   ______________________________________________________________________________________________ CARDIAC CATHETERIZATION  CARDIAC CATHETERIZATION 07/31/2022  Conclusion   Ost LAD to Prox LAD lesion is 70% stenosed.   Mid LM to Ost LAD lesion is 70% stenosed.   Ost Cx to Prox Cx lesion is 95% stenosed.   The left ventricular systolic function is normal.   LV end diastolic pressure is normal.   The left ventricular ejection fraction is 55-65% by visual estimate.  1.  Thrombotic, ulcerated severe stenosis of the ostial/proximal circumflex (culprit lesion) 2.  Moderately severe calcific distal left main stenosis of 70% 3.  Moderately severe calcified proximal LAD stenosis of 70% 4.  Mild diffuse nonobstructive plaquing of a large, dominant RCA which supplies a large PDA branch and multiple posterolateral branches 5.  Normal LV systolic function with no regional wall motion abnormalities  Recommendations: Difficult anatomy for PCI due to calcific distal left main and ostial LAD stenosis and severe ostial circumflex stenosis.  Cardiac surgery called and evaluated the patient's cath films with me while he was on the table.  We agree that  he is best suited for coronary bypass surgery.  He will be continued on heparin  and will have formal cardiac surgical consultation.  At the completion of the procedure, the patient is chest pain-free and his ST segments have normalized.  Findings Coronary Findings Diagnostic  Dominance: Right  Left Main Mid LM to Ost LAD lesion is 70% stenosed. The mid to distal left main is calcified with 60 to 70% distal vessel stenosis extending into the proximal LAD  Left Anterior  Descending The mid and distal LAD appear suitable for grafting Ost LAD to Prox LAD lesion is 70% stenosed. The proximal LAD has diffuse calcific 70% stenosis  Left Circumflex The AV circumflex is small.  The tight stenosis at the ostium leads to reduced flow in a bifurcating high OM/intermediate branch.  The remainder of the lateral wall is supplied by posterolateral branches of the RCA Ost Cx to Prox Cx lesion is 95% stenosed. The lesion is eccentric and thrombotic. The ostium of the circumflex extending all the way back to the left main has 95% ulcerative, thrombotic stenosis  Right Coronary Artery Vessel is large. This is a large, dominant vessel with diffuse nonobstructive stenosis.  There are no high-grade lesions throughout the RCA or its branch vessels  Intervention  No interventions have been documented.     ECHOCARDIOGRAM  ECHOCARDIOGRAM COMPLETE 07/11/2023  Narrative ECHOCARDIOGRAM REPORT    Patient Name:   Corey [name] Date of Exam: 07/11/2023 Medical Rec #:  [id number]       Height:       69.0 in Accession #:    [id number]      Weight:       157.8 lb Date of Birth:  Mar 15, 1941       BSA:          1.868 m Patient Age:    81 years        BP:           142/70 mmHg Patient Gender: M               HR:           62 bpm. Exam Location:  [address]  Procedure: 2D Echo, Cardiac Doppler, Color Doppler, 3D Echo and Strain Analysis  Indications:    I50.20* Unspecified systolic (congestive) heart failure  History:        Patient has prior history of Echocardiogram examinations, most recent 08/01/2022. CAD and Previous Myocardial Infarction, Prior CABG; Risk Factors:Hypertension and Dyslipidemia. Exertional dyspnea. Chronic lymphocytic leukemia.  Sonographer:    Jon Hacker RCS Referring Phys: [id number] Travonne Schowalter  IMPRESSIONS   1. Left ventricular ejection fraction, by estimation, is 60 to 65%. The left ventricle has normal function. The left ventricle has no  regional wall motion abnormalities. There is mild concentric left ventricular hypertrophy. Left ventricular diastolic parameters are consistent with Grade I diastolic dysfunction (impaired relaxation). 2. Right ventricular systolic function is normal. The right ventricular size is normal. 3. Left atrial size was moderately dilated. 4. The mitral valve is normal in structure. Mild mitral valve regurgitation. No evidence of mitral stenosis. 5. The aortic valve is tricuspid. There is mild calcification of the aortic valve. Aortic valve regurgitation is trivial. No aortic stenosis is present. 6. The inferior vena cava is normal in size with greater than 50% respiratory variability, suggesting right atrial pressure of 3 mmHg.  FINDINGS Left Ventricle: Left ventricular ejection fraction, by estimation, is 60 to 65%. The left ventricle has normal  function. The left ventricle has no regional wall motion abnormalities. The left ventricular internal cavity size was normal in size. There is mild concentric left ventricular hypertrophy. Left ventricular diastolic parameters are consistent with Grade I diastolic dysfunction (impaired relaxation).  Right Ventricle: The right ventricular size is normal. No increase in right ventricular wall thickness. Right ventricular systolic function is normal.  Left Atrium: Left atrial size was moderately dilated.  Right Atrium: Right atrial size was normal in size.  Pericardium: There is no evidence of pericardial effusion.  Mitral Valve: The mitral valve is normal in structure. Mild mitral valve regurgitation. No evidence of mitral valve stenosis.  Tricuspid Valve: The tricuspid valve is normal in structure. Tricuspid valve regurgitation is trivial. No evidence of tricuspid stenosis.  Aortic Valve: The aortic valve is tricuspid. There is mild calcification of the aortic valve. Aortic valve regurgitation is trivial. No aortic stenosis is present.  Pulmonic Valve: The  pulmonic valve was normal in structure. Pulmonic valve regurgitation is not visualized. No evidence of pulmonic stenosis.  Aorta: The aortic root is normal in size and structure.  Venous: The inferior vena cava is normal in size with greater than 50% respiratory variability, suggesting right atrial pressure of 3 mmHg.  IAS/Shunts: No atrial level shunt detected by color flow Doppler.   LEFT VENTRICLE PLAX 2D LVIDd:         3.80 cm   Diastology LVIDs:         2.60 cm   LV e' medial:    5.22 cm/s LV PW:         1.10 cm   LV E/e' medial:  17.2 LV IVS:        1.20 cm   LV e' lateral:   10.20 cm/s LVOT diam:     2.00 cm   LV E/e' lateral: 8.8 LV SV:         75 LV SV Index:   40 LVOT Area:     3.14 cm  3D Volume EF: 3D EF:        58 % LV EDV:       131 ml LV ESV:       55 ml LV SV:        76 ml  RIGHT VENTRICLE RV Basal diam:  3.20 cm RV S prime:     11.00 cm/s TAPSE (M-mode): 1.1 cm RVSP:           20.5 mmHg  LEFT ATRIUM             Index        RIGHT ATRIUM           Index LA diam:        4.50 cm 2.41 cm/m   RA Pressure: 3.00 mmHg LA Vol (A2C):   48.2 ml 25.80 ml/m  RA Area:     14.50 cm LA Vol (A4C):   55.8 ml 29.87 ml/m  RA Volume:   34.40 ml  18.41 ml/m LA Biplane Vol: 51.9 ml 27.78 ml/m AORTIC VALVE             PULMONIC VALVE LVOT Vmax:   113.00 cm/s PR End Diast Vel: 2.12 msec LVOT Vmean:  65.800 cm/s LVOT VTI:    0.238 m  AORTA Ao Root diam: 3.30 cm Ao Asc diam:  3.30 cm  MITRAL VALVE               TRICUSPID VALVE MV Area (PHT): 3.20 cm  TR Peak grad:   17.5 mmHg MV Decel Time: 237 msec    TR Vmax:        209.00 cm/s MV E velocity: 89.60 cm/s  Estimated RAP:  3.00 mmHg MV A velocity: 97.50 cm/s  RVSP:           20.5 mmHg MV E/A ratio:  0.92 SHUNTS Systemic VTI:  0.24 m Systemic Diam: 2.00 cm  Toribio Fuel MD Electronically signed by Toribio Fuel MD Signature Date/Time: 07/11/2023/4:33:51 PM    Final   TEE  ECHO INTRAOPERATIVE TEE  08/01/2022  Narrative *INTRAOPERATIVE TRANSESOPHAGEAL REPORT *    Patient Name:   NELLIE CHEVALIER Batterman Date of Exam: 08/01/2022 Medical Rec #:  [id number]       Height:       69.0 in Accession #:    [id number]      Weight:       160.0 lb Date of Birth:  [birth year]       BSA:          1.88 m Patient Age:    80 years        BP:           114/61 mmHg Patient Gender: M               HR:           64 bpm. Exam Location:  Inpatient  Transesophogeal exam was perform intraoperatively during surgical procedure. Patient was closely monitored under general anesthesia during the entirety of examination.  Indications:     coronary artery bypass surgery Performing Phys: [id number]ENDRICKSON Diagnosing Phys: Zachary Ee MD  Complications: No known complications during this procedure. POST-OP IMPRESSIONS _ Left Ventricle: The left ventricle is unchanged from pre-bypass. _ Right Ventricle: The right ventricle appears unchanged from pre-bypass. _ Aorta: The aorta appears unchanged from pre-bypass. _ Left Atrium: The left atrium appears unchanged from pre-bypass. _ Left Atrial Appendage: The left atrial appendage appears unchanged from pre-bypass. _ Aortic Valve: The aortic valve appears unchanged from pre-bypass. _ Mitral Valve: The mitral valve appears unchanged from pre-bypass. _ Tricuspid Valve: The tricuspid valve appears unchanged from pre-bypass. _ Pulmonic Valve: The pulmonic valve appears unchanged from pre-bypass. _ Interatrial Septum: The interatrial septum appears unchanged from pre-bypass. _ Interventricular Septum: The interventricular septum appears unchanged from pre-bypass. _ Pericardium: The pericardium appears unchanged from pre-bypass. _ Comments: Excellent LV function post bypass No RWMA.  PRE-OP FINDINGS Left Ventricle: The left ventricle has low normal systolic function, with an ejection fraction of 50-55%. The cavity size was normal. There is mildly increased left  ventricular wall thickness. No evidence of left ventricular regional wall motion abnormalities. There is mild concentric left ventricular hypertrophy.   Right Ventricle: The right ventricle has normal systolic function. The cavity was normal. There is increased right ventricular wall thickness. Right ventricular systolic pressure is normal.  Left Atrium: Left atrial size was normal in size. No left atrial/left atrial appendage thrombus was detected. The left atrial appendage is well visualized and there is no evidence of thrombus present.  Right Atrium: Right atrial size was normal in size.  Interatrial Septum: No atrial level shunt detected by color flow Doppler. Agitated saline contrast was given intravenously to evaluate for intracardiac shunting. There is no evidence of a patent foramen ovale.  Pericardium: A small pericardial effusion is present. The pericardial effusion is anterior to the right ventricle.  Mitral Valve: The mitral valve is normal in structure.  Mitral valve regurgitation is trivial by color flow Doppler. There is No evidence of mitral stenosis.  Tricuspid Valve: The tricuspid valve was normal in structure. Tricuspid valve regurgitation was not visualized by color flow Doppler. No evidence of tricuspid stenosis is present.  Aortic Valve: The aortic valve is normal in structure. Aortic valve regurgitation was not visualized by color flow Doppler. There is no stenosis of the aortic valve. There is moderate thickening and mild calcification present on the aortic valve right coronary cusp with normal mobility and there is moderate thickening and mild calcification present on the aortic valve non-coronary cusp with normal mobility.  Pulmonic Valve: The pulmonic valve was normal in structure, with normal. No evidence of pumonic stenosis. Pulmonic valve regurgitation is not visualized by color flow Doppler.   Aorta: The aortic root and ascending aorta are normal in size and  structure. There is evidence of plaque in the descending aorta; Grade I, measuring 1-38mm in size.  Shunts: There is no evidence of an atrial septal defect.   Zachary Ee MD Electronically signed by Zachary Ee MD Signature Date/Time: 08/01/2022/1:03:25 PM    Final        ______________________________________________________________________________________________      EKG:        Recent Labs: 05/19/2024: ALT 14; BUN 9; Creatinine 1.19; Hemoglobin 14.0; Platelet Count 108; Potassium 4.4; Sodium 142  Recent Lipid Panel    Component Value Date/Time   CHOL 114 12/24/2023 0836   TRIG 112 12/24/2023 0836   HDL 40 12/24/2023 0836   CHOLHDL 2.9 12/24/2023 0836   CHOLHDL 3.7 07/31/2022 0759   VLDL 22 07/31/2022 0759   LDLCALC 53 12/24/2023 0836     Risk Assessment/Calculations:                Physical Exam:    VS:  BP 128/70   Pulse 63   Ht 5' 9 (1.753 m)   Wt 162 lb 3.2 oz (73.6 kg)   SpO2 97%   BMI 23.95 kg/m     Wt Readings from Last 3 Encounters:  06/10/24 162 lb 3.2 oz (73.6 kg)  05/19/24 160 lb (72.6 kg)  12/23/23 157 lb 6.4 oz (71.4 kg)     GEN:  Well nourished, well developed in no acute distress HEENT: Normal NECK: No JVD; No carotid bruits LYMPHATICS: No lymphadenopathy CARDIAC: RRR, no murmurs, rubs, gallops RESPIRATORY:  Clear to auscultation without rales, wheezing or rhonchi  ABDOMEN: Soft, non-tender, non-distended MUSCULOSKELETAL:  No edema; No deformity  SKIN: Warm and dry NEUROLOGIC:  Alert and oriented x 3 PSYCHIATRIC:  Normal affect   Assessment & Plan Essential hypertension Blood pressure is well-controlled, not currently requiring antihypertensive therapy. Coronary artery disease involving native coronary artery of native heart without angina pectoris Patient now 2 years out from CABG.  Continue aspirin  for antiplatelet therapy and a high intensity statin drug with rosuvastatin  40 mg daily. Hyperlipidemia LDL goal <55 Treated  with rosuvastatin .  LDL cholesterol is 53.            Medication Adjustments/Labs and Tests Ordered: Current medicines are reviewed at length with the patient today.  Concerns regarding medicines are outlined above.  No orders of the defined types were placed in this encounter.  No orders of the defined types were placed in this encounter.   Patient Instructions  Medication Instructions:  No medication changes were made at this visit. Continue current regimen.   *If you need a refill on your cardiac medications before your  next appointment, please call your pharmacy*  Lab Work: None ordered today. If you have labs (blood work) drawn today and your tests are completely normal, you will receive your results only by: MyChart Message (if you have MyChart) OR A paper copy in the mail If you have any lab test that is abnormal or we need to change your treatment, we will call you to review the results.  Testing/Procedures: None ordered today.  Follow-Up: At [facility], you and your health needs are our priority.  As part of our continuing mission to provide you with exceptional heart care, our providers are all part of one team.  This team includes your primary Cardiologist (physician) and Advanced Practice Providers or APPs (Physician Assistants and Nurse Practitioners) who all work together to provide you with the care you need, when you need it.  Your next appointment:   1 year(s)  Provider:   Ozell Fell, MD      Signed, Ozell Fell, MD  06/10/2024 2:21 PM    [facility]

## 2024-06-10 NOTE — Patient Instructions (Signed)

## 2024-09-08 ENCOUNTER — Other Ambulatory Visit: Payer: Self-pay | Admitting: Cardiovascular Disease

## 2024-11-17 ENCOUNTER — Other Ambulatory Visit

## 2024-11-17 ENCOUNTER — Ambulatory Visit: Admitting: Hematology
